# Patient Record
Sex: Male | Born: 1975 | Race: White | Hispanic: No | Marital: Married | Smoking: Never smoker
Health system: Southern US, Community
[De-identification: ages and names within clinical notes are randomized; demographics above are authoritative.]

## PROBLEM LIST (undated history)

## (undated) DIAGNOSIS — E785 Hyperlipidemia, unspecified: Secondary | ICD-10-CM

## (undated) DIAGNOSIS — L259 Unspecified contact dermatitis, unspecified cause: Secondary | ICD-10-CM

## (undated) DIAGNOSIS — K645 Perianal venous thrombosis: Secondary | ICD-10-CM

## (undated) DIAGNOSIS — Z87891 Personal history of nicotine dependence: Secondary | ICD-10-CM

## (undated) DIAGNOSIS — F329 Major depressive disorder, single episode, unspecified: Secondary | ICD-10-CM

## (undated) DIAGNOSIS — F909 Attention-deficit hyperactivity disorder, unspecified type: Secondary | ICD-10-CM

## (undated) DIAGNOSIS — M542 Cervicalgia: Secondary | ICD-10-CM

## (undated) DIAGNOSIS — I10 Essential (primary) hypertension: Secondary | ICD-10-CM

## (undated) DIAGNOSIS — F32A Depression, unspecified: Secondary | ICD-10-CM

## (undated) DIAGNOSIS — M1990 Unspecified osteoarthritis, unspecified site: Secondary | ICD-10-CM

## (undated) DIAGNOSIS — S62366A Nondisplaced fracture of neck of fifth metacarpal bone, right hand, initial encounter for closed fracture: Secondary | ICD-10-CM

## (undated) HISTORY — PX: HERNIA REPAIR: SHX51

## (undated) HISTORY — PX: SHOULDER SURGERY: SHX246

## (undated) HISTORY — DX: Attention-deficit hyperactivity disorder, unspecified type: F90.9

## (undated) HISTORY — DX: Major depressive disorder, single episode, unspecified: F32.9

## (undated) HISTORY — DX: Cervicalgia: M54.2

## (undated) HISTORY — DX: Hyperlipidemia, unspecified: E78.5

## (undated) HISTORY — DX: Essential (primary) hypertension: I10

## (undated) HISTORY — DX: Unspecified osteoarthritis, unspecified site: M19.90

## (undated) HISTORY — DX: Depression, unspecified: F32.A

## (undated) HISTORY — DX: Personal history of nicotine dependence: Z87.891

## (undated) HISTORY — DX: Nondisplaced fracture of neck of fifth metacarpal bone, right hand, initial encounter for closed fracture: S62.366A

## (undated) HISTORY — DX: Unspecified contact dermatitis, unspecified cause: L25.9

## (undated) HISTORY — DX: Perianal venous thrombosis: K64.5

---

## 2004-10-21 ENCOUNTER — Emergency Department (HOSPITAL_COMMUNITY): Admission: EM | Admit: 2004-10-21 | Discharge: 2004-10-21 | Payer: Self-pay | Admitting: Emergency Medicine

## 2009-02-20 ENCOUNTER — Telehealth: Payer: Self-pay | Admitting: Internal Medicine

## 2009-02-20 ENCOUNTER — Ambulatory Visit: Payer: Self-pay | Admitting: Internal Medicine

## 2009-02-20 DIAGNOSIS — F3289 Other specified depressive episodes: Secondary | ICD-10-CM

## 2009-02-20 DIAGNOSIS — F329 Major depressive disorder, single episode, unspecified: Secondary | ICD-10-CM

## 2009-02-20 HISTORY — DX: Other specified depressive episodes: F32.89

## 2009-02-20 HISTORY — DX: Major depressive disorder, single episode, unspecified: F32.9

## 2009-03-16 ENCOUNTER — Ambulatory Visit: Payer: Self-pay | Admitting: Internal Medicine

## 2009-03-16 DIAGNOSIS — Z87891 Personal history of nicotine dependence: Secondary | ICD-10-CM

## 2009-03-16 HISTORY — DX: Personal history of nicotine dependence: Z87.891

## 2009-09-03 ENCOUNTER — Emergency Department (HOSPITAL_COMMUNITY): Admission: EM | Admit: 2009-09-03 | Discharge: 2009-09-03 | Payer: Self-pay | Admitting: Family Medicine

## 2009-09-13 ENCOUNTER — Ambulatory Visit: Payer: Self-pay | Admitting: Internal Medicine

## 2009-09-13 ENCOUNTER — Encounter: Payer: Self-pay | Admitting: Internal Medicine

## 2009-09-13 DIAGNOSIS — M542 Cervicalgia: Secondary | ICD-10-CM

## 2009-09-13 HISTORY — DX: Cervicalgia: M54.2

## 2009-10-06 ENCOUNTER — Ambulatory Visit: Payer: Self-pay | Admitting: Internal Medicine

## 2009-10-06 DIAGNOSIS — L259 Unspecified contact dermatitis, unspecified cause: Secondary | ICD-10-CM

## 2009-10-06 HISTORY — DX: Unspecified contact dermatitis, unspecified cause: L25.9

## 2010-02-12 IMAGING — CT CT HEAD W/O CM
1 series · 16 of 30 positions shown, 20 images · non-contrast
Comparison: None

CLINICAL DATA: Headaches with nausea and dizziness

CT HEAD WITHOUT CONTRAST
TECHNIQUE: Contiguous axial images were obtained from the base of
the skull through the vertex without contrast.

[Series 2: head_seq 4.5 h37s st · axial · 0.43mm/px · z∈[+1275,+1423]mm · 16 of 36 slices shown, 20 images]
[im 2/36  brain]
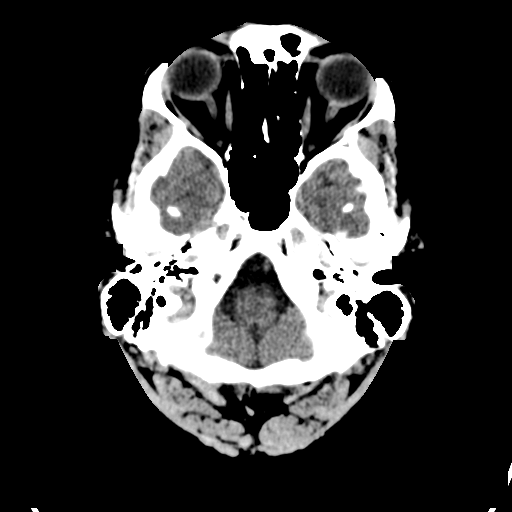
[im 2/36  bone]
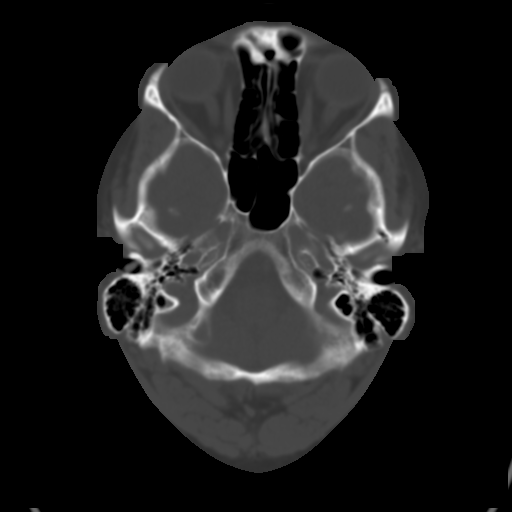
[im 4/36  brain]
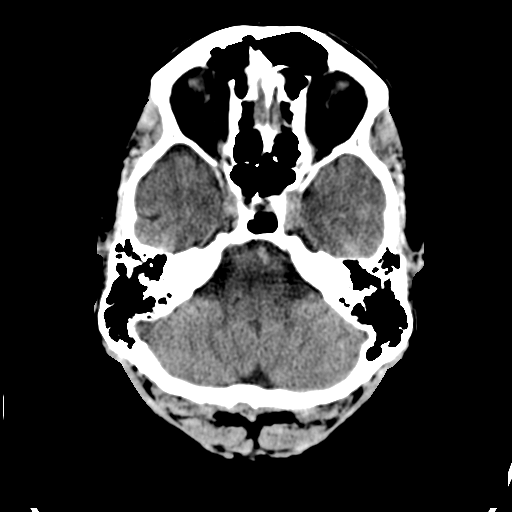
[im 7/36  brain]
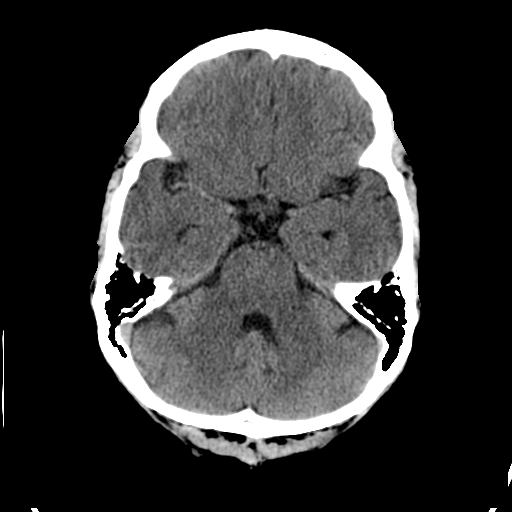
[im 9/36  brain]
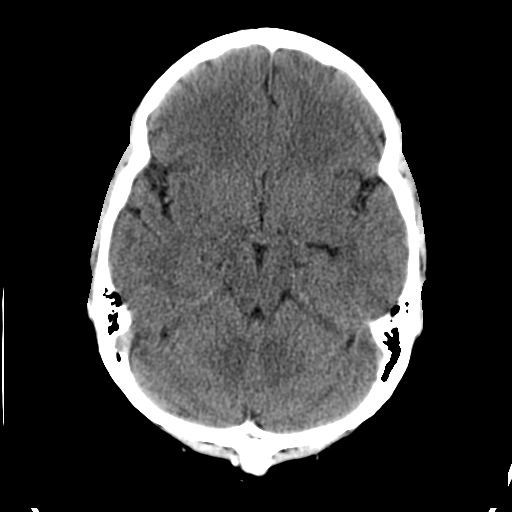
[im 10/36  brain]
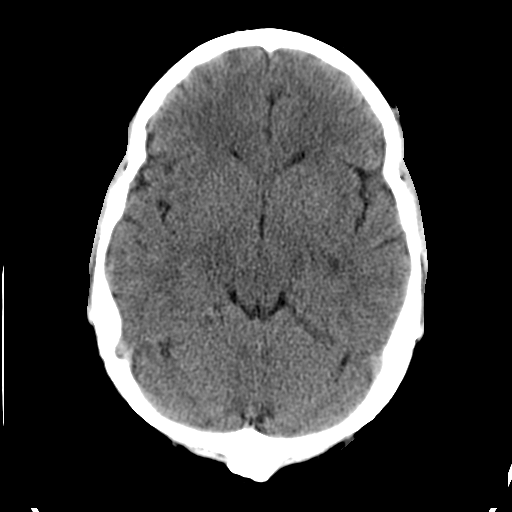
[im 10/36  bone]
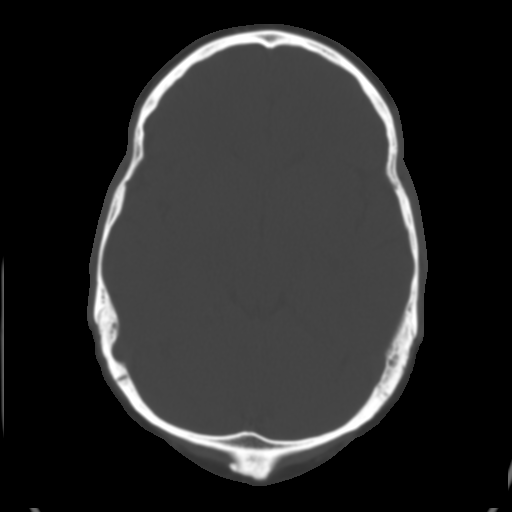
[im 13/36  brain]
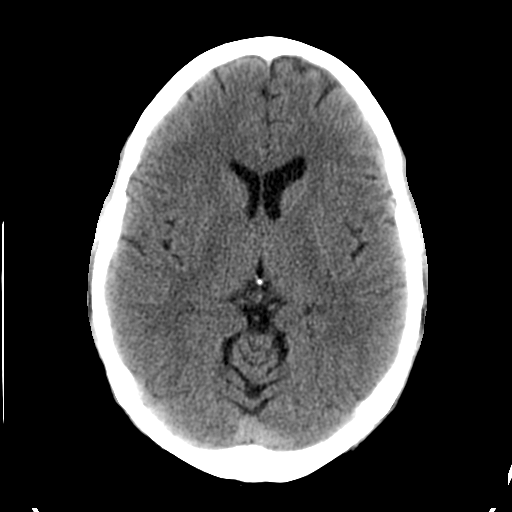
[im 15/36  brain]
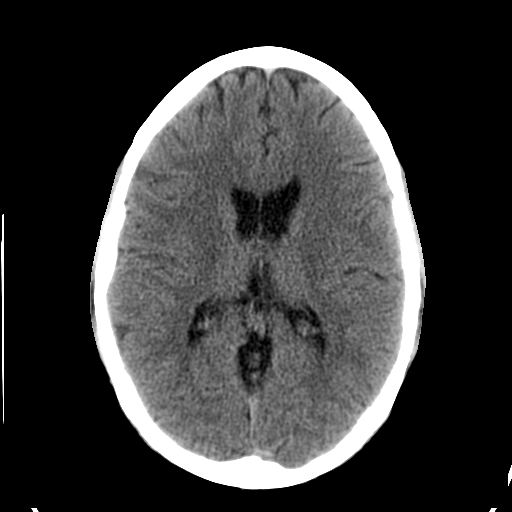
[im 17/36  brain]
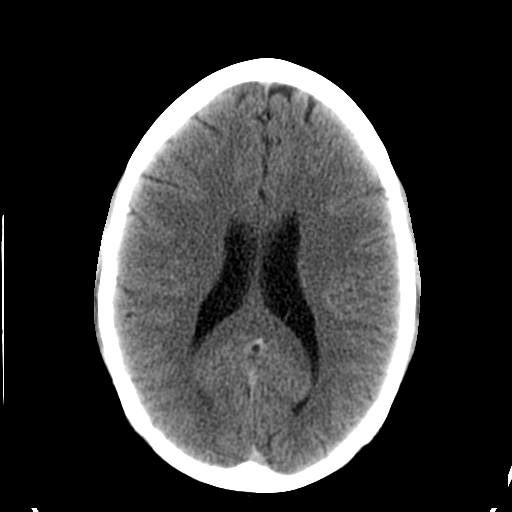
[im 19/36  brain]
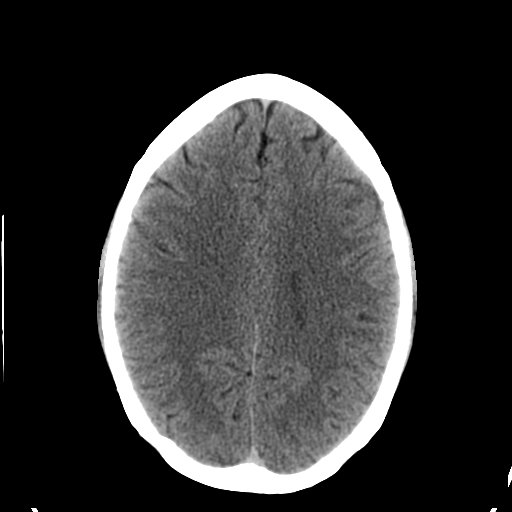
[im 19/36  bone]
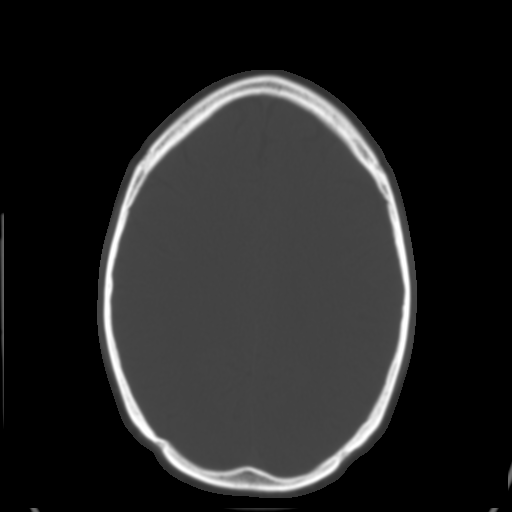
[im 21/36  brain]
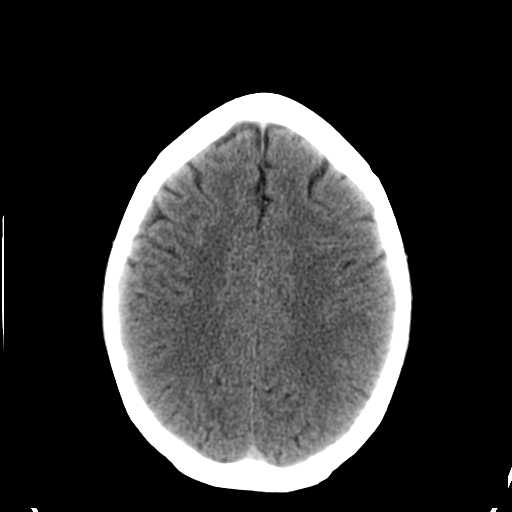
[im 23/36  brain]
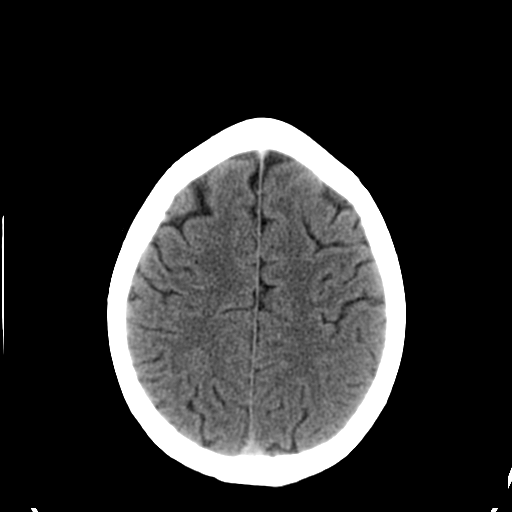
[im 26/36  brain]
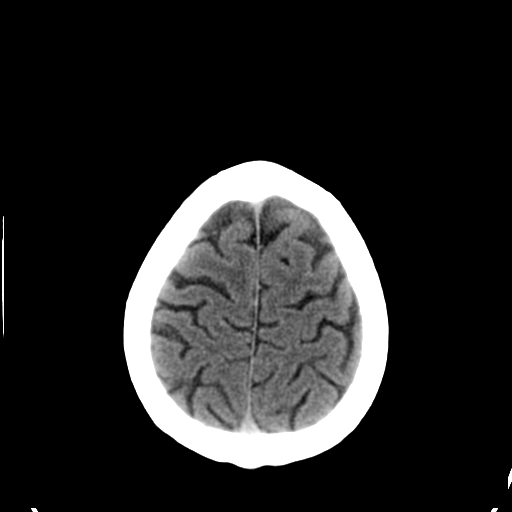
[im 27/36  brain]
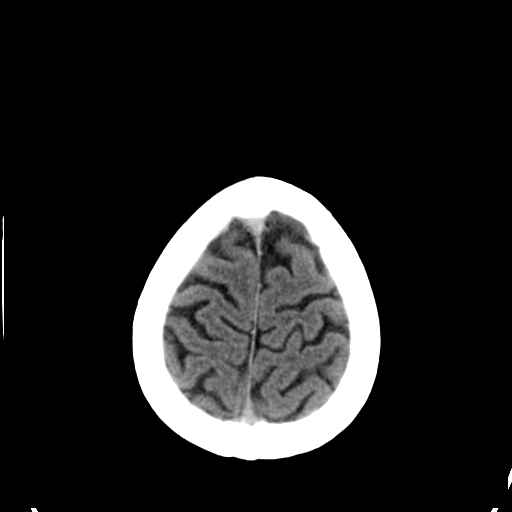
[im 27/36  bone]
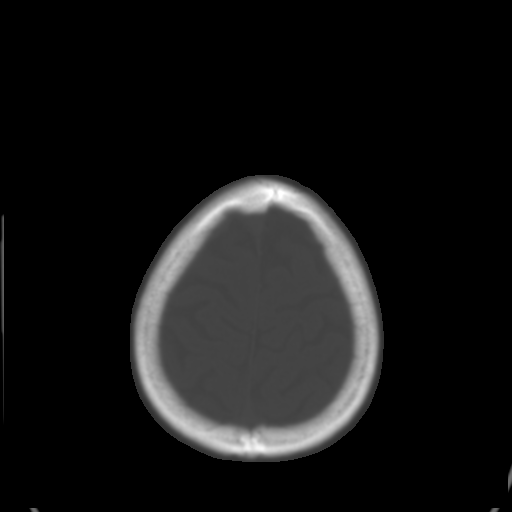
[im 29/36  brain]
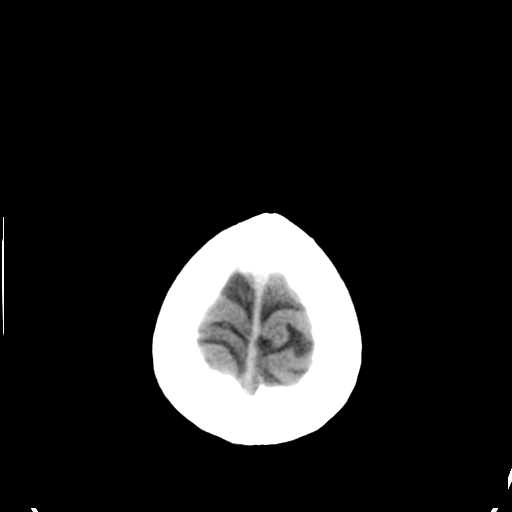
[im 32/36  brain]
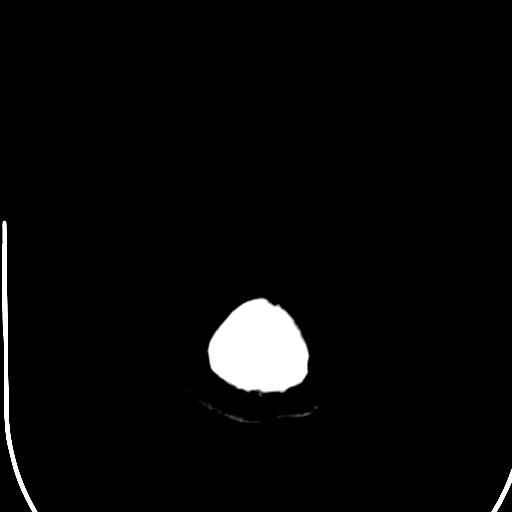
[im 34/36  brain]
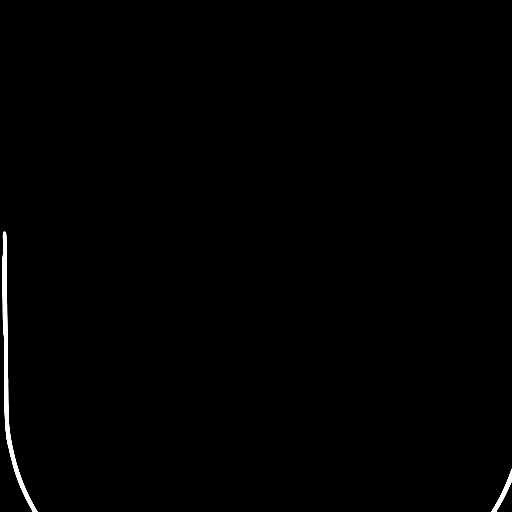

[16 of 30 positions shown; findings below may reference images not displayed]

FINDINGS: Ventricular size and CSF spaces within normal limits. No
evidence for acute infarct, hemorrhage, or mass lesion. No extra-
axial fluid collections or midline shift.  Calvarium intact.  No
fluid in the sinuses visualized.
IMPRESSION: No acute or significant findings.

## 2010-07-08 LAB — CONVERTED CEMR LAB
Direct LDL: 154.7 mg/dL
Total CHOL/HDL Ratio: 4
VLDL: 25 mg/dL (ref 0.0–40.0)

## 2010-07-10 NOTE — Assessment & Plan Note (Signed)
Summary: RASH FOREARMS/CD   Vital Signs:  Patient profile:   35 year old male Height:      72 inches Weight:      188 pounds BMI:     25.59 O2 Sat:      96 % on Room air Temp:     98.0 degrees F oral Pulse rate:   72 / minute Pulse rhythm:   regular Resp:     16 per minute BP sitting:   122 / 84  (left arm) Cuff size:   large  Vitals Entered By: Rock Nephew CMA (October 06, 2009 3:00 PM)  O2 Flow:  Room air CC: Bilateral recurrent arm rash Is Patient Diabetic? No Pain Assessment Patient in pain? no        Primary Care Provider:  Etta Grandchild MD  CC:  Bilateral recurrent arm rash.  History of Present Illness: He returns c/o an intermittent rash on both forearms off and on for one year. It seems to worsen with sun exposure which causes it to whelp up.  Allergies: 1)  ! Sulfa  Past History:  Past Medical History: Reviewed history from 02/20/2009 and no changes required. Depression  Past Surgical History: Reviewed history from 02/20/2009 and no changes required. Inguinal herniorrhaphy  Family History: Reviewed history from 02/20/2009 and no changes required. N/A  Social History: Reviewed history from 02/20/2009 and no changes required. Occupation: Teaching laboratory technician Never Smoked Alcohol use-yes Drug use-no Regular exercise-yes  Review of Systems Derm:  Complains of itching and rash; denies changes in color of skin, changes in nail beds, dryness, excessive perspiration, flushing, hair loss, lesion(s), and poor wound healing.  Physical Exam  General:  Well-developed, well-nourished, in no acute distress; alert and oriented x 3.   Mouth:  Oral mucosa and oropharynx without lesions or exudates.  Teeth in good repair. Neck:  supple, full ROM, no masses, no thyromegaly, normal carotid upstroke, no carotid bruits, and no cervical lymphadenopathy.   Lungs:  Normal respiratory effort, chest expands symmetrically. Lungs are clear to auscultation, no  crackles or wheezes. Heart:  Normal rate and regular rhythm. S1 and S2 normal without gallop, murmur, click, rub or other extra sounds. Abdomen:  Bowel sounds positive,abdomen soft and non-tender without masses, organomegaly or hernias noted. Skin:  over the sun-exposed portions of his forearms he has some xerosis and faint areas of ezcema but there are no excoriations, wheals, lichenifications, abnormal pigment, pustules, or PIPA. Cervical Nodes:  no anterior cervical adenopathy and no posterior cervical adenopathy.   Axillary Nodes:  no R axillary adenopathy and no L axillary adenopathy.   Inguinal Nodes:  no R inguinal adenopathy and no L inguinal adenopathy.   Psych:  Cognition and judgment appear intact. Alert and cooperative with normal attention span and concentration. No apparent delusions, illusions, hallucinations   Impression & Recommendations:  Problem # 1:  CONTACT DERMATITIS&OTHER ECZEMA DUE UNSPEC CAUSE (ICD-692.9) Assessment New  try some otc anti-histamines like zyrtec His updated medication list for this problem includes:    Triamcinolone Acetonide 0.5 % Crea (Triamcinolone acetonide) .Marland Kitchen... Apply to aa two times a day  Orders: Admin of Therapeutic Inj  intramuscular or subcutaneous (61607) Depo- Medrol 80mg  (J1040) Depo- Medrol 40mg  (J1030)  Discussed avoidance of triggers and symptomatic treatment.   Complete Medication List: 1)  Fluvoxamine Maleate 100 Mg Tabs (Fluvoxamine maleate) .... Once daily 2)  Triamcinolone Acetonide 0.5 % Crea (Triamcinolone acetonide) .... Apply to aa two times a day  Patient Instructions:  1)  Please schedule a follow-up appointment in 1 month. Prescriptions: TRIAMCINOLONE ACETONIDE 0.5 % CREA (TRIAMCINOLONE ACETONIDE) Apply to AA two times a day  #30 gms x 2   Entered and Authorized by:   Etta Grandchild MD   Signed by:   Etta Grandchild MD on 10/06/2009   Method used:   Electronically to        [id number] (retail)        [address]       [place], [place]  [address]       Ph: [phone] or [phone]       Fax: [phone]   RxID:   [id number]    Medication Administration  Injection # 1:    Medication: Depo- Medrol 40mg     Diagnosis: CONTACT DERMATITIS&OTHER ECZEMA DUE UNSPEC CAUSE (ICD-692.9)    Route: IM    Site: R deltoid    Exp Date: 04/2012    Lot #: [id number]    Mfr: pfizer    Patient tolerated injection without complications    Given by: Rock Nephew CMA (October 06, 2009 1:33 PM)  Injection # 2:    Medication: Depo- Medrol 80mg     Diagnosis: CONTACT DERMATITIS&OTHER ECZEMA DUE UNSPEC CAUSE (ICD-692.9)    Route: IM    Site: R deltoid    Exp Date: 04/2012    Lot #: [id number]    Mfr: pfizer    Patient tolerated injection without complications    Given by: Rock Nephew CMA (October 06, 2009 1:33 PM)  Orders Added: 1)  Admin of Therapeutic Inj  intramuscular or subcutaneous [96372] 2)  Depo- Medrol 80mg  [J1040] 3)  Depo- Medrol 40mg  [J1030] 4)  Est. Patient Level IV [42595]
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
## 2010-07-10 NOTE — Letter (Signed)
Summary: Results Follow-up Letter  [facility]  [address] [place], [place] [address]   Phone: [phone]  Fax: [phone]    09/13/2009  [address] [place], [place]  [address]  Dear Albert [name],   The following are the results of your recent test(s):  Test     Result     Neck Xray     normal  _________________________________________________________  Please call for an appointment as directed _________________________________________________________ _________________________________________________________ _________________________________________________________  Sincerely,  Sanda Linger MD [facility]

## 2015-10-06 ENCOUNTER — Encounter: Payer: Self-pay | Admitting: Family

## 2015-10-06 ENCOUNTER — Ambulatory Visit (INDEPENDENT_AMBULATORY_CARE_PROVIDER_SITE_OTHER): Payer: 59 | Admitting: Family

## 2015-10-06 VITALS — BP 134/90 | HR 66 | Temp 98.0°F | Resp 16 | Ht 72.0 in | Wt 194.0 lb

## 2015-10-06 DIAGNOSIS — K645 Perianal venous thrombosis: Secondary | ICD-10-CM | POA: Diagnosis not present

## 2015-10-06 HISTORY — DX: Perianal venous thrombosis: K64.5

## 2015-10-06 MED ORDER — HYDROCORTISONE 2.5 % RE CREA: 1.0000 "application " | TOPICAL_CREAM | Freq: Two times a day (BID) | RECTAL | Status: DC

## 2015-10-06 MED ORDER — ACETAMINOPHEN-CODEINE #3 300-30 MG PO TABS: 1.0000 | ORAL_TABLET | ORAL | Status: DC | PRN

## 2015-10-06 NOTE — Patient Instructions (Signed)
Thank you for choosing [facility].  Summary/Instructions:  Your prescription(s) have been submitted to your pharmacy or been printed and provided for you. Please take as directed and contact our office if you believe you are having problem(s) with the medication(s) or have any questions.  If your symptoms worsen or fail to improve, please contact our office for further instruction, or in case of emergency go directly to the emergency room at the closest medical facility.   Hemorrhoids Hemorrhoids are swollen veins around the rectum or anus. There are two types of hemorrhoids:   Internal hemorrhoids. These occur in the veins just inside the rectum. They may poke through to the outside and become irritated and painful.  External hemorrhoids. These occur in the veins outside the anus and can be felt as a painful swelling or hard lump near the anus. CAUSES 1. Pregnancy.  2. Obesity.  3. Constipation or diarrhea.  4. Straining to have a bowel movement.  5. Sitting for long periods on the toilet. 6. Heavy lifting or other activity that caused you to strain. 7. Anal intercourse. SYMPTOMS   Pain.   Anal itching or irritation.   Rectal bleeding.   Fecal leakage.   Anal swelling.   One or more lumps around the anus.  DIAGNOSIS  Your caregiver may be able to diagnose hemorrhoids by visual examination. Other examinations or tests that may be performed include:   Examination of the rectal area with a gloved hand (digital rectal exam).   Examination of anal canal using a small tube (scope).   A blood test if you have lost a significant amount of blood.  A test to look inside the colon (sigmoidoscopy or colonoscopy). TREATMENT Most hemorrhoids can be treated at home. However, if symptoms do not seem to be getting better or if you have a lot of rectal bleeding, your caregiver may perform a procedure to help make the hemorrhoids get smaller or remove them completely.  Possible treatments include:   Placing a rubber band at the base of the hemorrhoid to cut off the circulation (rubber band ligation).   Injecting a chemical to shrink the hemorrhoid (sclerotherapy).   Using a tool to burn the hemorrhoid (infrared light therapy).   Surgically removing the hemorrhoid (hemorrhoidectomy).   Stapling the hemorrhoid to block blood flow to the tissue (hemorrhoid stapling).  HOME CARE INSTRUCTIONS   Eat foods with fiber, such as whole grains, beans, nuts, fruits, and vegetables. Ask your doctor about taking products with added fiber in them (fibersupplements).  Increase fluid intake. Drink enough water and fluids to keep your urine clear or pale yellow.   Exercise regularly.   Go to the bathroom when you have the urge to have a bowel movement. Do not wait.   Avoid straining to have bowel movements.   Keep the anal area dry and clean. Use wet toilet paper or moist towelettes after a bowel movement.   Medicated creams and suppositories may be used or applied as directed.   Only take over-the-counter or prescription medicines as directed by your caregiver.   Take warm sitz baths for 15-20 minutes, 3-4 times a day to ease pain and discomfort.   Place ice packs on the hemorrhoids if they are tender and swollen. Using ice packs between sitz baths may be helpful.   Put ice in a plastic bag.   Place a towel between your skin and the bag.   Leave the ice on for 15-20 minutes, 3-4 times a  day.   Do not use a donut-shaped pillow or sit on the toilet for long periods. This increases blood pooling and pain.  SEEK MEDICAL CARE IF:  You have increasing pain and swelling that is not controlled by treatment or medicine.  You have uncontrolled bleeding.  You have difficulty or you are unable to have a bowel movement.  You have pain or inflammation outside the area of the hemorrhoids. MAKE SURE YOU:  Understand these instructions.  Will  watch your condition.  Will get help right away if you are not doing well or get worse.   This information is not intended to replace advice given to you by your health care provider. Make sure you discuss any questions you have with your health care provider.   Document Released: 05/24/2000 Document Revised: 05/13/2012 Document Reviewed: 03/31/2012 Elsevier Interactive Patient Education 2016 [employer]  How to Take a I[employer] IndustriesSitz Bath A sitz bath is a warm water bath that is taken while you are sitting down. The water should only come up to your hips and should cover your buttocks. Your health care provider may recommend a sitz bath to help you:   Clean the lower part of your body, including your genital area.  With itching.  With pain.  With sore muscles or muscles that tighten or spasm. HOW TO TAKE A SITZ BATH Take 3-4 sitz baths per day or as told by your health care provider. 8. Partially fill a bathtub with warm water. You will only need the water to be deep enough to cover your hips and buttocks when you are sitting in it. 9. If your health care provider told you to put medicine in the water, follow the directions exactly. 10. Sit in the water and open the tub drain a little. 11. Turn on the warm water again to keep the tub at the correct level. Keep the water running constantly. 12. Soak in the water for 15-20 minutes or as told by your health care provider. 13. After the sitz bath, pat the affected area dry first. Do not rub it. 14. Be careful when you stand up after the sitz bath because you may feel dizzy. SEEK MEDICAL CARE IF:  Your symptoms get worse. Do not continue with sitz baths if your symptoms get worse.  You have new symptoms. Do not continue with sitz baths until you talk with your health care provider.   This information is not intended to replace advice given to you by your health care provider. Make sure you discuss any questions you have with your health care  provider.   Document Released: 02/17/2004 Document Revised: 10/11/2014 Document Reviewed: 05/25/2014 Elsevier Interactive Patient Education [employer] Elsevier Inc.

## 2015-10-06 NOTE — Progress Notes (Signed)
Pre visit review using our clinic review tool, if applicable. No additional management support is needed unless otherwise documented below in the visit note. 

## 2015-10-06 NOTE — Progress Notes (Signed)
   Subjective:    Patient ID: Albert [name], male    DOB: [birth year], 40 y.o.   MRN: [id number]  Chief Complaint  Patient presents with  . Establish Care    has been going to the gym and thinks he has a hemorrhoid    HPI:  Albert FirstJohn Vane is a 40 y.o. male who  has a past medical history of Depression and Hyperlipidemia. and presents today for an office visit to establish care.   Previously diagnosed with hemorrhoids. Recently he was performing a squat with exercise with extreme straining resulting in pain located in his rectum that has been going on for a couple of days. Modifying factors include Aleve which has helped a little with the pain. Endorses some burning, itching and pressure. There has been a little improvement since initial onset.  Reports normal bowel movements with no blood in his stool.   Allergies  Allergen Reactions  . Sulfonamide Derivatives     REACTION: Rash Stiff joints     No current outpatient prescriptions on file prior to visit.   No current facility-administered medications on file prior to visit.    Review of Systems  Constitutional: Negative for fever and chills.  Gastrointestinal: Positive for rectal pain. Negative for diarrhea, constipation and blood in stool.      Objective:    BP 134/90 mmHg  Pulse 66  Temp(Src) 98 F (36.7 C) (Oral)  Resp 16  Ht 6' (1.829 m)  Wt 194 lb (87.998 kg)  BMI 26.31 kg/m2  SpO2 96% Nursing note and vital signs reviewed.  Physical Exam  Constitutional: He is oriented to person, place, and time. He appears well-developed and well-nourished. No distress.  Cardiovascular: Normal rate, regular rhythm, normal heart sounds and intact distal pulses.   Pulmonary/Chest: Effort normal and breath sounds normal.  Genitourinary: Rectal exam shows external hemorrhoid (Appears thrombosed).     Neurological: He is alert and oriented to person, place, and time.  Skin: Skin is warm and dry.  Psychiatric: He has a normal mood  and affect. His behavior is normal. Judgment and thought content normal.       Assessment & Plan:   Problem List Items Addressed This Visit      Cardiovascular and Mediastinum   Thrombosed external hemorrhoid - Primary   Relevant Medications   hydrocortisone (ANUSOL-HC) 2.5 % rectal cream       I am having Mr. Lawerance BachBurns start on hydrocortisone and acetaminophen-codeine. I am also having him maintain his fluvoxaMINE.   Meds ordered this encounter  Medications  . fluvoxaMINE (LUVOX) 100 MG tablet    Sig: Take 200 mg by mouth at bedtime.  . hydrocortisone (ANUSOL-HC) 2.5 % rectal cream    Sig: Place 1 application rectally 2 (two) times daily.    Dispense:  30 g    Refill:  0    Order Specific Question:  Supervising Provider    Answer:  Hillard DankerRAWFORD, ELIZABETH A [[id number]]  . acetaminophen-codeine (TYLENOL #3) 300-30 MG tablet    Sig: Take 1 tablet by mouth every 4 (four) hours as needed for moderate pain.    Dispense:  30 tablet    Refill:  0    Order Specific Question:  Supervising Provider    Answer:  Hillard DankerRAWFORD, ELIZABETH A [[id number]]     Follow-up: Return if symptoms worsen or fail to improve.  Jeanine Luzalone, Gregory, FNP

## 2016-06-19 DIAGNOSIS — F909 Attention-deficit hyperactivity disorder, unspecified type: Secondary | ICD-10-CM

## 2016-06-19 HISTORY — DX: Attention-deficit hyperactivity disorder, unspecified type: F90.9

## 2016-07-10 DIAGNOSIS — S62366A Nondisplaced fracture of neck of fifth metacarpal bone, right hand, initial encounter for closed fracture: Secondary | ICD-10-CM

## 2016-07-10 HISTORY — DX: Nondisplaced fracture of neck of fifth metacarpal bone, right hand, initial encounter for closed fracture: S62.366A

## 2017-03-24 ENCOUNTER — Ambulatory Visit: Payer: 59 | Admitting: Family Medicine

## 2017-07-14 DIAGNOSIS — R079 Chest pain, unspecified: Secondary | ICD-10-CM | POA: Insufficient documentation

## 2017-07-18 ENCOUNTER — Ambulatory Visit: Payer: 59 | Admitting: Cardiology

## 2017-07-18 ENCOUNTER — Other Ambulatory Visit: Payer: Self-pay

## 2017-07-18 ENCOUNTER — Encounter: Payer: Self-pay | Admitting: Cardiology

## 2017-07-18 VITALS — BP 152/94 | HR 65 | Ht 73.0 in | Wt 197.0 lb

## 2017-07-18 DIAGNOSIS — R079 Chest pain, unspecified: Secondary | ICD-10-CM | POA: Diagnosis not present

## 2017-07-18 DIAGNOSIS — I10 Essential (primary) hypertension: Secondary | ICD-10-CM

## 2017-07-18 DIAGNOSIS — E785 Hyperlipidemia, unspecified: Secondary | ICD-10-CM

## 2017-07-18 DIAGNOSIS — Z7189 Other specified counseling: Secondary | ICD-10-CM | POA: Diagnosis not present

## 2017-07-18 MED ORDER — TELMISARTAN-HCTZ 40-12.5 MG PO TABS: 1.0000 | ORAL_TABLET | Freq: Every day | ORAL | 3 refills | Status: DC

## 2017-07-18 NOTE — Progress Notes (Signed)
Cardiology Office Note:    Date:  07/20/2017   ID:  Albert [name], DOB July 10, 1975, MRN [id number]  PCP:  Veryl Speakalone, Gregory D, FNP  Cardiologist:  Norman HerrlichBrian Aztlan Coll, MD   Referring MD: Jorene GuestFerro, Richard T, MD  ASSESSMENT:    1. Essential hypertension 2. Hyperlipidemia 3. Cardiac risk assessment PLAN:    In order of problems listed above:  1. Stage 2 , start ARB/ diuretic with daily home BP and PCP FU. He was advised DASH diet and activity > 150 minutes a week. Avoid NSAID that can elevated BP> 2. Life style and diet changes, reassess after 3 months and I would not start a statin at this time. 3. His risk is not severe and I did not advise a stress test at this time.  Next appointment as needed, I referred to PCP for ongoing treatment of hypertension and follow up for hyperlipidemia.   Medication Adjustments/Labs and Tests Ordered: Current medicines are reviewed at length with the patient today.  Concerns regarding medicines are outlined above.  Orders Placed This Encounter  Procedures  . Aldosterone + renin activity w/ ratio  . Basic metabolic panel  . EKG 12-Lead   Meds ordered this encounter  Medications  . telmisartan-hydrochlorothiazide (MICARDIS HCT) 40-12.5 MG tablet    Sig: Take 1 tablet by mouth daily.    Dispense:  90 tablet    Refill:  3     Chief Complaint  Patient presents with  . Referral    pt was referred from pcp because of high cholesterol and high blood pressure.  . Hypertension    Do I need a stress test    History of Present Illness:    Albert PolingJohn R [name] is a 42 y.o. male who is being seen today for the evaluation of elevated BP 150-160/100, " I might need a stress test" at his request .He has been seen at urgent care in [place] after fall and rib fractures, found to have consistently elevated BP and advised to see cardiology. Home BP averages 140-150/90-100 and not started on medical therapy to date.Cholesterol was elevated at 240.He travels extensively,  is sedentary and has taken NSAID after rib fracture for pain control. He has had no angina, exercise intolerance, palpitation or syncope. He has no history of murmur, congenital or rheumatic heart disease.He ias committed to life style changes and ready to take antihypertensive therapy if needed.  Past Medical History:  Diagnosis Date  . Attention deficit hyperactivity disorder (ADHD) 06/19/2016  . Closed nondisplaced fracture of neck of fifth metacarpal bone of right hand 07/10/2016  . CONTACT DERMATITIS&OTHER ECZEMA DUE UNSPEC CAUSE 10/06/2009   Qualifier: Diagnosis of  By: Yetta BarreJones MD, Bernadene Bellhomas L.   . Depression   . DEPRESSION 02/20/2009   Qualifier: Diagnosis of  By: Yetta BarreJones MD, Bernadene Bellhomas L.   . Hyperlipidemia   . NECK PAIN, ACUTE 09/13/2009   Qualifier: Diagnosis of  By: Yetta BarreJones MD, Bernadene Bellhomas L.   . Thrombosed external hemorrhoid 10/06/2015   Symptoms and exam consistent with thrombosed external hemorrhoid. Patient declines incise and drain at this time. Treat conservatively with hydrocortisone rectal cream and Tylenol 3 as needed for discomfort. Recommend sitz bath. Follow-up if symptoms worsen or do not improve or would like to seek interventional procedure.   . TOBACCO USE, QUIT 03/16/2009   Qualifier: Diagnosis of  By: Tora PerchesGrace, Vanessa      Past Surgical History:  Procedure Laterality Date  . HERNIA REPAIR      Current Medications:  Current Meds  Medication Sig  . amphetamine-dextroamphetamine (ADDERALL XR) 5 MG 24 hr capsule Take by mouth.  Marland Kitchen amphetamine-dextroamphetamine (ADDERALL) 10 MG tablet   . buPROPion (WELLBUTRIN SR) 100 MG 12 hr tablet Take 100 mg by mouth 2 (two) times daily.  Marland Kitchen buPROPion (WELLBUTRIN XL) 150 MG 24 hr tablet TAEK 1 TABLET BY MOUTH EVERY MORNING  . clomiPHENE (CLOMID) 50 MG tablet   . DUEXIS 800-26.6 MG TABS Take 1 tablet by mouth 3 (three) times daily.  . fluvoxaMINE (LUVOX) 100 MG tablet Take 200 mg by mouth at bedtime.     Allergies:   Sulfamethoxazole and Sulfonamide  derivatives   Social History   Socioeconomic History  . Marital status: Widowed    Spouse name: None  . Number of children: 1  . Years of education: 62  . Highest education level: None  Social Needs  . Financial resource strain: None  . Food insecurity - worry: None  . Food insecurity - inability: None  . Transportation needs - medical: None  . Transportation needs - non-medical: None  Occupational History  . Occupation: Sales  Tobacco Use  . Smoking status: Never Smoker  . Smokeless tobacco: Never Used  Substance and Sexual Activity  . Alcohol use: Yes    Alcohol/week: 4.2 oz    Types: 7 Cans of beer per week  . Drug use: No  . Sexual activity: None  Other Topics Concern  . None  Social History Narrative   Fun: Exercise, play music, sports     Family History: The patient's family history includes Healthy in his mother; Heart disease in his paternal grandfather; Heart failure in his paternal grandfather; Hyperlipidemia in his paternal grandfather.  ROS:   Review of Systems  Constitution: Negative.  HENT: Negative.   Eyes: Negative.   Cardiovascular: Positive for chest pain (severeal rib fractures, improved, taking NSAID PRN) and leg swelling (on NSAID). Negative for claudication, cyanosis, dyspnea on exertion, irregular heartbeat, near-syncope, orthopnea, palpitations and paroxysmal nocturnal dyspnea.  Respiratory: Negative.   Endocrine: Negative.   Skin: Negative.   Musculoskeletal: Negative.   Gastrointestinal: Negative.   Genitourinary: Negative.   Neurological: Negative.   Psychiatric/Behavioral: Negative.   Allergic/Immunologic: Negative.    Please see the history of present illness.     All other systems reviewed and are negative.  EKGs/Labs/Other Studies Reviewed:    The following studies were reviewed today:  EKG:  EKG is  ordered today.  The ekg ordered today demonstrates [facility] and is normal, no findings of LVH  Recent Labs: My recent cholesterol is  240 but not advised medication 07/18/2017: BUN 17; Creatinine, Ser 1.03; Potassium 4.5; Sodium 141  Recent Lipid Panel    Component Value Date/Time   CHOL 228 (H) 02/20/2009 0000   TRIG 125.0 02/20/2009 0000   HDL 55.40 02/20/2009 0000   CHOLHDL 4 02/20/2009 0000   VLDL 25.0 02/20/2009 0000   LDLDIRECT 154.7 02/20/2009 0000    Physical Exam:    VS:  BP (!) 152/94 (BP Location: Right Arm, Patient Position: Sitting, Cuff Size: Normal)   Pulse 65   Ht 6\' 1"  (1.854 m)   Wt 197 lb (89.4 kg)   SpO2 98%   BMI 25.99 kg/m     Wt Readings from Last 3 Encounters:  07/18/17 197 lb (89.4 kg)  10/06/15 194 lb (88 kg)     GEN:  Well nourished, well developed in no acute distress HEENT: Normal NECK: No JVD; No carotid bruits  LYMPHATICS: No lymphadenopathy CARDIAC: RRR, no murmurs, rubs, gallops RESPIRATORY:  Clear to auscultation without rales, wheezing or rhonchi  ABDOMEN: Soft, non-tender, non-distended no renal bruit noted MUSCULOSKELETAL:  No edema; No deformity  SKIN: Warm and dry NEUROLOGIC:  Alert and oriented x 3 PSYCHIATRIC:  Normal affect     Signed, Norman Herrlich, MD  07/20/2017 8:01 PM    [facility]

## 2017-07-18 NOTE — Patient Instructions (Addendum)
Medication Instructions:  Your physician has recommended you make the following change in your medication:  START telmisartan-hydrochlorothiazide 40-12.5 mg daily  Labwork: Your physician recommends that you return for lab work in: today. BMP, aldosterone/renin level  Testing/Procedures: None  Follow-Up: Your physician recommends that you schedule a follow-up appointment as needed if symptoms worsen or fail to improve.  Follow up with your primary doctor.  Any Other Special Instructions Will Be Listed Below (If Applicable).     If you need a refill on your cardiac medications before your next appointment, please call your pharmacy.    Goal of 150 minutes of activity a week  DASH diet: Healthy eating to lower your blood pressure The DASH diet emphasizes portion size, eating a variety of foods and getting the right amount of nutrients. Discover how DASH can improve your health and lower your blood pressure. By [facility] Staff  DASH stands for Dietary Approaches to Stop Hypertension. The DASH diet is a lifelong approach to healthy eating that's designed to help treat or prevent high blood pressure (hypertension). The DASH diet encourages you to reduce the sodium in your diet and eat a variety of foods rich in nutrients that help lower blood pressure, such as potassium, calcium and magnesium. By following the DASH diet, you may be able to reduce your blood pressure by a few points in just two weeks. Over time, your systolic blood pressure could drop by eight to 14 points, which can make a significant difference in your health risks. Because the DASH diet is a healthy way of eating, it offers health benefits besides just lowering blood pressure. The DASH diet is also in line with dietary recommendations to prevent osteoporosis, cancer, heart disease, stroke and diabetes. DASH diet: Sodium levels The DASH diet emphasizes vegetables, fruits and low-fat dairy foods - and moderate amounts  of whole grains, fish, poultry and nuts. In addition to the standard DASH diet, there is also a lower sodium version of the diet. You can choose the version of the diet that meets your health needs: Standard DASH diet. You can consume up to 2,300 milligrams (mg) of sodium a day.  Lower sodium DASH diet. You can consume up to 1,500 mg of sodium a day. Both versions of the DASH diet aim to reduce the amount of sodium in your diet compared with what you might get in a typical American diet, which can amount to a whopping 3,400 mg of sodium a day or more. The standard DASH diet meets the recommendation from the Dietary Guidelines for Americans to keep daily sodium intake to less than 2,300 mg a day. The American Heart Association recommends 1,500 mg a day of sodium as an upper limit for all adults. If you aren't sure what sodium level is right for you, talk to your doctor. DASH diet: What to eat Both versions of the DASH diet include lots of whole grains, fruits, vegetables and low-fat dairy products. The DASH diet also includes some fish, poultry and legumes, and encourages a small amount of nuts and seeds a few times a week.  You can eat red meat, sweets and fats in small amounts. The DASH diet is low in saturated fat, cholesterol and total fat. Here's a look at the recommended servings from each food group for the 2,000-calorie-a-day DASH diet. Grains: 6 to 8 servings a day Grains include bread, cereal, rice and pasta. Examples of one serving of grains include 1 slice whole-wheat bread, 1 ounce dry cereal,  or 1/2 cup cooked cereal, rice or pasta. Focus on whole grains because they have more fiber and nutrients than do refined grains. For instance, use brown rice instead of white rice, whole-wheat pasta instead of regular pasta and whole-grain bread instead of white bread. Look for products labeled "100 percent whole grain" or "100 percent whole wheat."  Grains are naturally low in fat. Keep them this  way by avoiding butter, cream and cheese sauces. Vegetables: 4 to 5 servings a day Tomatoes, carrots, broccoli, sweet potatoes, greens and other vegetables are full of fiber, vitamins, and such minerals as potassium and magnesium. Examples of one serving include 1 cup raw leafy green vegetables or 1/2 cup cut-up raw or cooked vegetables. Don't think of vegetables only as side dishes - a hearty blend of vegetables served over brown rice or whole-wheat noodles can serve as the main dish for a meal.  Fresh and frozen vegetables are both good choices. When buying frozen and canned vegetables, choose those labeled as low sodium or without added salt.  To increase the number of servings you fit in daily, be creative. In a stir-fry, for instance, cut the amount of meat in half and double up on the vegetables. Fruits: 4 to 5 servings a day Many fruits need little preparation to become a healthy part of a meal or snack. Like vegetables, they're packed with fiber, potassium and magnesium and are typically low in fat - coconuts are an exception. Examples of one serving include one medium fruit, 1/2 cup fresh, frozen or canned fruit, or 4 ounces of juice. Have a piece of fruit with meals and one as a snack, then round out your day with a dessert of fresh fruits topped with a dollop of low-fat yogurt.  Leave on edible peels whenever possible. The peels of apples, pears and most fruits with pits add interesting texture to recipes and contain healthy nutrients and fiber.  Remember that citrus fruits and juices, such as grapefruit, can interact with certain medications, so check with your doctor or pharmacist to see if they're OK for you.  If you choose canned fruit or juice, make sure no sugar is added. Dairy: 2 to 3 servings a day Milk, yogurt, cheese and other dairy products are major sources of calcium, vitamin D and protein. But the key is to make sure that you choose dairy products that are low fat or fat-free  because otherwise they can be a major source of fat - and most of it is saturated. Examples of one serving include 1 cup skim or 1 percent milk, 1 cup low fat yogurt, or 1 1/2 ounces part-skim cheese. Low-fat or fat-free frozen yogurt can help you boost the amount of dairy products you eat while offering a sweet treat. Add fruit for a healthy twist.  If you have trouble digesting dairy products, choose lactose-free products or consider taking an over-the-counter product that contains the enzyme lactase, which can reduce or prevent the symptoms of lactose intolerance.  Go easy on regular and even fat-free cheeses because they are typically high in sodium. Lean meat, poultry and fish: 6 servings or fewer a day Meat can be a rich source of protein, B vitamins, iron and zinc. Choose lean varieties and aim for no more than 6 ounces a day. Cutting back on your meat portion will allow room for more vegetables. Trim away skin and fat from poultry and meat and then bake, broil, grill or roast instead of frying in fat.  Eat heart-healthy fish, such as salmon, herring and tuna. These types of fish are high in omega-3 fatty acids, which can help lower your total cholesterol. Nuts, seeds and legumes: 4 to 5 servings a week Almonds, sunflower seeds, kidney beans, peas, lentils and other foods in this family are good sources of magnesium, potassium and protein. They're also full of fiber and phytochemicals, which are plant compounds that may protect against some cancers and cardiovascular disease. Serving sizes are small and are intended to be consumed only a few times a week because these foods are high in calories. Examples of one serving include 1/3 cup nuts, 2 tablespoons seeds, or 1/2 cup cooked beans or peas.  Nuts sometimes get a bad rap because of their fat content, but they contain healthy types of fat - monounsaturated fat and omega-3 fatty acids. They're high in calories, however, so eat them in moderation.  Try adding them to stir-fries, salads or cereals.  Soybean-based products, such as tofu and tempeh, can be a good alternative to meat because they contain all of the amino acids your body needs to make a complete protein, just like meat. Fats and oils: 2 to 3 servings a day Fat helps your body absorb essential vitamins and helps your body's immune system. But too much fat increases your risk of heart disease, diabetes and obesity. The DASH diet strives for a healthy balance by limiting total fat to less than 30 percent of daily calories from fat, with a focus on the healthier monounsaturated fats. Examples of one serving include 1 teaspoon soft margarine, 1 tablespoon mayonnaise or 2 tablespoons salad dressing. Saturated fat and trans fat are the main dietary culprits in increasing your risk of coronary artery disease. DASH helps keep your daily saturated fat to less than 6 percent of your total calories by limiting use of meat, butter, cheese, whole milk, cream and eggs in your diet, along with foods made from lard, solid shortenings, and palm and coconut oils.  Avoid trans fat, commonly found in such processed foods as crackers, baked goods and fried items.  Read food labels on margarine and salad dressing so that you can choose those that are lowest in saturated fat and free of trans fat. Sweets: 5 servings or fewer a week You don't have to banish sweets entirely while following the DASH diet - just go easy on them. Examples of one serving include 1 tablespoon sugar, jelly or jam, 1/2 cup sorbet, or 1 cup lemonade. When you eat sweets, choose those that are fat-free or low-fat, such as sorbets, fruit ices, jelly beans, hard candy, graham crackers or low-fat cookies.  Artificial sweeteners such as aspartame (NutraSweet, Equal) and sucralose (Splenda) may help satisfy your sweet tooth while sparing the sugar. But remember that you still must use them sensibly. It's OK to swap a diet cola for a regular  cola, but not in place of a more nutritious beverage such as low-fat milk or even plain water.  Cut back on added sugar, which has no nutritional value but can pack on calories. DASH diet: Alcohol and caffeine Drinking too much alcohol can increase blood pressure. The Dietary Guidelines for Americans recommends that men limit alcohol to no more than two drinks a day and women to one or less. The DASH diet doesn't address caffeine consumption. The influence of caffeine on blood pressure remains unclear. But caffeine can cause your blood pressure to rise at least temporarily. If you already have high blood pressure or  if you think caffeine is affecting your blood pressure, talk to your doctor about your caffeine consumption. DASH diet and weight loss While the DASH diet is not a weight-loss program, you may indeed lose unwanted pounds because it can help guide you toward healthier food choices. The DASH diet generally includes about 2,000 calories a day. If you're trying to lose weight, you may need to eat fewer calories. You may also need to adjust your serving goals based on your individual circumstances - something your health care team can help you decide. Tips to cut back on sodium The foods at the core of the DASH diet are naturally low in sodium. So just by following the DASH diet, you're likely to reduce your sodium intake. You also reduce sodium further by: Using sodium-free spices or flavorings with your food instead of salt  Not adding salt when cooking rice, pasta or hot cereal  Rinsing canned foods to remove some of the sodium  Buying foods labeled "no salt added," "sodium-free," "low sodium" or "very low sodium" One teaspoon of table salt has 2,325 mg of sodium. When you read food labels, you may be surprised at just how much sodium some processed foods contain. Even low-fat soups, canned vegetables, ready-to-eat cereals and sliced [place] from the local deli - foods you may have considered  healthy - often have lots of sodium. You may notice a difference in taste when you choose low-sodium food and beverages. If things seem too bland, gradually introduce low-sodium foods and cut back on table salt until you reach your sodium goal. That'll give your palate time to adjust. Using salt-free seasoning blends or herbs and spices may also ease the transition. It can take several weeks for your taste buds to get used to less salty foods. Putting the pieces of the DASH diet together Try these strategies to get started on the DASH diet:  Change gradually. If you now eat only one or two servings of fruits or vegetables a day, try to add a serving at lunch and one at dinner. Rather than switching to all whole grains, start by making one or two of your grain servings whole grains. Increasing fruits, vegetables and whole grains gradually can also help prevent bloating or diarrhea that may occur if you aren't used to eating a diet with lots of fiber. You can also try over-the-counter products to help reduce gas from beans and vegetables.  Reward successes and forgive slip-ups. Reward yourself with a nonfood treat for your accomplishments - rent a movie, purchase a book or get together with a friend. Everyone slips, especially when learning something new. Remember that changing your lifestyle is a long-term process. Find out what triggered your setback and then just pick up where you left off with the DASH diet.  Add physical activity. To boost your blood pressure lowering efforts even more, consider increasing your physical activity in addition to following the DASH diet. Combining both the DASH diet and physical activity makes it more likely that you'll reduce your blood pressure.  Get support if you need it. If you're having trouble sticking to your diet, talk to your doctor or dietitian about it. You might get some tips that will help you stick to the DASH diet. Remember, healthy eating isn't an  all-or-nothing proposition. What's most important is that, on average, you eat healthier foods with plenty of variety - both to keep your diet nutritious and to avoid boredom or extremes. And with the DASH diet,  you can have both.

## 2017-07-20 DIAGNOSIS — Z7189 Other specified counseling: Secondary | ICD-10-CM | POA: Insufficient documentation

## 2017-07-24 LAB — ALDOSTERONE + RENIN ACTIVITY W/ RATIO
ALDOS/RENIN RATIO: 17.9 (ref 0.0–30.0)
ALDOSTERONE: 6 ng/dL (ref 0.0–30.0)
Renin: 0.335 ng/mL/hr (ref 0.167–5.380)

## 2017-07-24 LAB — BASIC METABOLIC PANEL
BUN/Creatinine Ratio: 17 (ref 9–20)
BUN: 17 mg/dL (ref 6–24)
CALCIUM: 9.6 mg/dL (ref 8.7–10.2)
CHLORIDE: 100 mmol/L (ref 96–106)
CO2: 22 mmol/L (ref 20–29)
Creatinine, Ser: 1.03 mg/dL (ref 0.76–1.27)
GFR calc non Af Amer: 90 mL/min/{1.73_m2} (ref >59)
GFR, EST AFRICAN AMERICAN: 104 mL/min/{1.73_m2} (ref >59)
GLUCOSE: 93 mg/dL (ref 65–99)
Potassium: 4.5 mmol/L (ref 3.5–5.2)
Sodium: 141 mmol/L (ref 134–144)

## 2018-02-07 ENCOUNTER — Encounter

## 2018-04-24 ENCOUNTER — Encounter: Payer: Self-pay | Admitting: Physician Assistant

## 2018-04-24 ENCOUNTER — Ambulatory Visit (INDEPENDENT_AMBULATORY_CARE_PROVIDER_SITE_OTHER): Payer: 59 | Admitting: Physician Assistant

## 2018-04-24 DIAGNOSIS — G4700 Insomnia, unspecified: Secondary | ICD-10-CM | POA: Diagnosis not present

## 2018-04-24 DIAGNOSIS — F429 Obsessive-compulsive disorder, unspecified: Secondary | ICD-10-CM

## 2018-04-24 DIAGNOSIS — F411 Generalized anxiety disorder: Secondary | ICD-10-CM | POA: Diagnosis not present

## 2018-04-24 MED ORDER — BUSPIRONE HCL 15 MG PO TABS: ORAL_TABLET | ORAL | 1 refills | Status: DC

## 2018-04-24 MED ORDER — ESZOPICLONE 3 MG PO TABS: 3.0000 mg | ORAL_TABLET | Freq: Every evening | ORAL | 1 refills | Status: DC | PRN

## 2018-04-24 NOTE — Progress Notes (Signed)
[facility]  Patient ID: Albert [name],  MRN: [id number]  PCP: Veryl Speak, FNP  Date of Evaluation: 04/26/2018 Time spent:15 minutes  Chief Complaint:  Chief Complaint    Anxiety      HISTORY/CURRENT STATUS: HPI Wants to discuss meds.  He wanted to get off the SSRIs.  "I've been on them for 20 years.  I've been off the Luvox for 2 months but I'll get into the 'thought loops' and is having anxiety randomly.  I really don't want to go back on them.  Any of the SSRIs have caused sexual side effects and just I am not happy with that."  The worst part of the problem is obsessive thoughts that he is unable to control now that he is been off of the Luvox.  No compulsive behaviors really but unable to function normally because he cannot get certain thoughts out of his head.  He is also having some generalized anxiety with occasional palpitations or shortness of breath but otherwise no full-blown panic attack symptoms.  It does not prevent him from working or having relationship issues.  He sleeps well as long as he has the [place].  It has helped quite a bit.  He is able to go to sleep and stay asleep when taking it.  Patient denies loss of interest in usual activities and is able to enjoy things.  Denies decreased energy or motivation.  Appetite has not changed.  No extreme sadness, tearfulness, or feelings of hopelessness.  Denies any changes in concentration, making decisions or remembering things.  Denies suicidal or homicidal thoughts.  States he he still drinks some but nothing like he was before.  He may have 1 or 2 drinks here and there but it is more occasional than a daily basis.  Individual Medical History/ Review of Systems: Changes? :No    Past medications for mental health diagnoses include: Luvox, Lunesta, Xanax, Wellbutrin, Risperdal, Prozac, Ambien, Deplin, Abilify, naltrexone  Allergies: Sulfamethoxazole and Sulfonamide derivatives  Current  Medications:  Current Outpatient Medications:  .  clomiPHENE (CLOMID) 50 MG tablet, , Disp: , Rfl:  .  telmisartan-hydrochlorothiazide (MICARDIS HCT) 40-12.5 MG tablet, Take 1 tablet by mouth daily., Disp: 90 tablet, Rfl: 3 .  busPIRone (BUSPAR) 15 MG tablet, 1/3 po bid for 7 days, then 2/3 po bid for 7 days, then 1 po bid., Disp: 60 tablet, Rfl: 1 .  DUEXIS 800-26.6 MG TABS, Take 1 tablet by mouth 3 (three) times daily., Disp: , Rfl: 1 .  Eszopiclone 3 MG TABS, Take 1 tablet (3 mg total) by mouth at bedtime as needed. Take immediately before bedtime, Disp: 30 tablet, Rfl: 1 Medication Side Effects: none  Family Medical/ Social History: Changes? No  MENTAL HEALTH EXAM:  There were no vitals taken for this visit.There is no height or weight on file to calculate BMI.  General Appearance: Casual  Eye Contact:  Good  Speech:  Clear and Coherent  Volume:  Normal  Mood:  Euthymic  Affect:  Appropriate  Thought Process:  Goal Directed  Orientation:  Full (Time, Place, and Person)  Thought Content: Logical   Suicidal Thoughts:  No  Homicidal Thoughts:  No  Memory:  WNL  Judgement:  Good  Insight:  Good  Psychomotor Activity:  Normal  Concentration:  Concentration: Good  Recall:  Good  Fund of Knowledge: Good  Language: Good  Assets:  Desire for Improvement  ADL's:  Intact  Cognition: WNL  Prognosis:  Good  DIAGNOSES:    ICD-10-CM   1. Generalized anxiety disorder F41.1   2. Obsessive-compulsive disorder, unspecified type F42.9   3. Insomnia, unspecified type G47.00     Receiving Psychotherapy: Yes Albert [name]   RECOMMENDATIONS: We discussed different options.  He is aware that SSRIs are much more helpful for OCD than any other medications would be.  However because of the sexual side effects we agreed not to restart a drug in that class. Start BuSpar 15 mg 1/3 tablet twice daily for 1 week, then increase to 2/3 tablet twice daily for 1 week, then increase to 1 tablet  twice daily for anxiety. Continue Lunesta as directed.  Discussed sleep hygiene. Continue to see his counselor, Albert Leedsugene [name] Return in 6 weeks.   Melony Overlyeresa Shearon Clonch, PA-C

## 2018-04-29 ENCOUNTER — Ambulatory Visit (INDEPENDENT_AMBULATORY_CARE_PROVIDER_SITE_OTHER): Payer: Self-pay | Admitting: Orthopedic Surgery

## 2018-05-01 ENCOUNTER — Ambulatory Visit (INDEPENDENT_AMBULATORY_CARE_PROVIDER_SITE_OTHER): Payer: 59 | Admitting: Orthopedic Surgery

## 2018-05-01 ENCOUNTER — Encounter (INDEPENDENT_AMBULATORY_CARE_PROVIDER_SITE_OTHER): Payer: Self-pay | Admitting: Orthopedic Surgery

## 2018-05-01 ENCOUNTER — Ambulatory Visit (INDEPENDENT_AMBULATORY_CARE_PROVIDER_SITE_OTHER): Payer: Self-pay

## 2018-05-01 DIAGNOSIS — M25511 Pain in right shoulder: Secondary | ICD-10-CM | POA: Diagnosis not present

## 2018-05-01 DIAGNOSIS — M19011 Primary osteoarthritis, right shoulder: Secondary | ICD-10-CM

## 2018-05-04 ENCOUNTER — Encounter (INDEPENDENT_AMBULATORY_CARE_PROVIDER_SITE_OTHER): Payer: Self-pay | Admitting: Orthopedic Surgery

## 2018-05-04 DIAGNOSIS — M19011 Primary osteoarthritis, right shoulder: Secondary | ICD-10-CM

## 2018-05-04 MED ORDER — BUPIVACAINE HCL 0.25 % IJ SOLN
0.6600 mL | INTRAMUSCULAR | Status: AC | PRN
  Administered 2018-05-04: .66 mL via INTRA_ARTICULAR

## 2018-05-04 MED ORDER — METHYLPREDNISOLONE ACETATE 40 MG/ML IJ SUSP
13.3300 mg | INTRAMUSCULAR | Status: AC | PRN
  Administered 2018-05-04: 13.33 mg via INTRA_ARTICULAR

## 2018-05-04 MED ORDER — LIDOCAINE HCL 1 % IJ SOLN
3.0000 mL | INTRAMUSCULAR | Status: AC | PRN
  Administered 2018-05-04: 3 mL

## 2018-05-04 NOTE — Progress Notes (Signed)
Office Visit Note   Patient: Albert [name]           Date of Birth: May 08, 1976           MRN: [id number] Visit Date: 05/01/2018 Requested by: Veryl Speak, FNP [address] [place], [place] [address] PCP: Veryl Speak, FNP  Subjective: Chief Complaint  Patient presents with  . Right Shoulder - Pain    HPI: Albert [name] is a patient with right shoulder pain.  He has had pain since the summer when he was lifting and throwing his stepson while he was in the pool.  Localizes the pain anteriorly and superiorly in the right shoulder.  Does report a catching.  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  It sore for him to move.  Denies any popping or grinding.  He is able to bench press.  He cannot sleep on the right-hand side.  Not as bad now as it was in the summer.  Does take some occasional over-the-counter medication.              ROS: All systems reviewed are negative as they relate to the chief complaint within the history of present illness.  Patient denies  fevers or chills.   Assessment & Plan: Visit Diagnoses:  1. Right shoulder pain, unspecified chronicity   2. Arthritis of right acromioclavicular joint     Plan: Impression is right shoulder AC joint arthritis possible labral pathology with good rotator cuff strength and no evidence of frozen shoulder.  Plan at this time is to do ultrasound-guided injection into the [facility] joint.  I will see him back in about 8 weeks to decide if we need to do further imaging.  Also want him to take some anti-inflammatories for about a week.  Topicals could also be helpful in this case.  Sample provided.  Follow-Up Instructions: No follow-ups on file.   Orders:  Orders Placed This Encounter  Procedures  . XR Shoulder Right   No orders of the defined types were placed in this encounter.     Procedures: Medium Joint Inj on 05/04/2018 12:26 PM Indications: pain and diagnostic evaluation Details:  27 G 1.5 in needle, ultrasound-guided superior approach Medications: 13.33 mg methylPREDNISolone acetate 40 MG/ML; 0.66 mL bupivacaine 0.25 %; 3 mL lidocaine 1 % Outcome: tolerated well, no immediate complications Procedure, treatment alternatives, risks and benefits explained, specific risks discussed. Consent was given by the patient. Immediately prior to procedure a time out was called to verify the correct patient, procedure, equipment, support staff and site/side marked as required. Patient was prepped and draped in the usual sterile fashion.       Clinical Data: No additional findings.  Objective: Vital Signs: There were no vitals taken for this visit.  Physical Exam:   Constitutional: Patient appears well-developed HEENT:  Head: Normocephalic Eyes:EOM are normal Neck: Normal range of motion Cardiovascular: Normal rate Pulmonary/chest: Effort normal Neurologic: Patient is alert Skin: Skin is warm Psychiatric: Patient has normal mood and affect    Ortho Exam: Ortho exam demonstrates full active and passive range of motion of the cervical spine.  Patient has a little bit of swelling in the joint of his AC joint on the right compared to the left and there is more tenderness on the right compared to the left.  No loss of passive range of motion of the right shoulder versus left with negative apprehension relocation testing on the  right.  No AC joint instability to crossarm adduction testing but there is pain.  Equivocal O'Brien's testing and positive impingement testing on the right.  No coarse grinding or crepitus with labral load testing.  Specialty Comments:  No specialty comments available.  Imaging: No results found.   PMFS History: Patient Active Problem List   Diagnosis Date Noted  . Cardiac risk counseling 07/20/2017  . Closed nondisplaced fracture of neck of fifth metacarpal bone of right hand 07/10/2016  . Attention deficit hyperactivity disorder (ADHD)  06/19/2016  . Thrombosed external hemorrhoid 10/06/2015  . CONTACT DERMATITIS&OTHER ECZEMA DUE UNSPEC CAUSE 10/06/2009  . NECK PAIN, ACUTE 09/13/2009  . TOBACCO USE, QUIT 03/16/2009  . DEPRESSION 02/20/2009   Past Medical History:  Diagnosis Date  . Attention deficit hyperactivity disorder (ADHD) 06/19/2016  . Closed nondisplaced fracture of neck of fifth metacarpal bone of right hand 07/10/2016  . CONTACT DERMATITIS&OTHER ECZEMA DUE UNSPEC CAUSE 10/06/2009   Qualifier: Diagnosis of  By: Yetta BarreJones MD, Bernadene Bellhomas L.   . Depression   . DEPRESSION 02/20/2009   Qualifier: Diagnosis of  By: Yetta BarreJones MD, Bernadene Bellhomas L.   . Hyperlipidemia   . NECK PAIN, ACUTE 09/13/2009   Qualifier: Diagnosis of  By: Yetta BarreJones MD, Bernadene Bellhomas L.   . Thrombosed external hemorrhoid 10/06/2015   Symptoms and exam consistent with thrombosed external hemorrhoid. Patient declines incise and drain at this time. Treat conservatively with hydrocortisone rectal cream and Tylenol 3 as needed for discomfort. Recommend sitz bath. Follow-up if symptoms worsen or do not improve or would like to seek interventional procedure.   . TOBACCO USE, QUIT 03/16/2009   Qualifier: Diagnosis of  By: Tora PerchesGrace, Vanessa      Family History  Problem Relation Age of Onset  . Healthy Mother   . Hyperlipidemia Paternal Grandfather   . Heart disease Paternal Grandfather   . Heart failure Paternal Grandfather     Past Surgical History:  Procedure Laterality Date  . HERNIA REPAIR     Social History   Occupational History  . Occupation: Sales  Tobacco Use  . Smoking status: Never Smoker  . Smokeless tobacco: Never Used  Substance and Sexual Activity  . Alcohol use: Yes    Alcohol/week: 14.0 standard drinks    Types: 14 Cans of beer per week    Comment: per week  . Drug use: No  . Sexual activity: Yes

## 2018-05-25 ENCOUNTER — Encounter: Payer: Self-pay | Admitting: Emergency Medicine

## 2018-05-25 DIAGNOSIS — G4700 Insomnia, unspecified: Secondary | ICD-10-CM | POA: Insufficient documentation

## 2018-05-25 DIAGNOSIS — F429 Obsessive-compulsive disorder, unspecified: Secondary | ICD-10-CM | POA: Insufficient documentation

## 2018-06-05 ENCOUNTER — Ambulatory Visit (INDEPENDENT_AMBULATORY_CARE_PROVIDER_SITE_OTHER): Payer: Self-pay | Admitting: Orthopedic Surgery

## 2018-06-12 ENCOUNTER — Encounter (INDEPENDENT_AMBULATORY_CARE_PROVIDER_SITE_OTHER): Payer: Self-pay | Admitting: Orthopedic Surgery

## 2018-06-12 ENCOUNTER — Ambulatory Visit: Payer: Self-pay | Admitting: Physician Assistant

## 2018-06-12 ENCOUNTER — Ambulatory Visit (INDEPENDENT_AMBULATORY_CARE_PROVIDER_SITE_OTHER): Payer: 59 | Admitting: Orthopedic Surgery

## 2018-06-12 ENCOUNTER — Other Ambulatory Visit (INDEPENDENT_AMBULATORY_CARE_PROVIDER_SITE_OTHER): Payer: Self-pay

## 2018-06-12 DIAGNOSIS — M19011 Primary osteoarthritis, right shoulder: Secondary | ICD-10-CM

## 2018-06-12 DIAGNOSIS — M25511 Pain in right shoulder: Secondary | ICD-10-CM

## 2018-06-12 NOTE — Progress Notes (Signed)
Office Visit Note   Patient: Albert [name]           Date of Birth: 1975-09-29           MRN: [id number] Visit Date: 06/12/2018 Requested by: Veryl Speak, FNP [address] [place], [place] [address] PCP: Veryl Speak, FNP  Subjective: Chief Complaint  Patient presents with  . Right Shoulder - Follow-up    HPI: [name] is a 43 year old patient with right shoulder pain.  Had [facility] joint injection 05/04/2018.  That gave him about 2 weeks of relief but now the pain is recurring.  Duexis helps him.  Denies any mechanical symptoms in the shoulder.  Radiographs do show some spurring of the [facility] joint.              ROS: All systems reviewed are negative as they relate to the chief complaint within the history of present illness.  Patient denies  fevers or chills.   Assessment & Plan: Visit Diagnoses:  1. Arthritis of right acromioclavicular joint     Plan: Impression is right shoulder AC joint arthritis with possible cyst formation and spur formation.  He has had an injection but presents with continued pain.  Plan MRI arthrogram to evaluate labral rotator cuff and particularly potential degeneration and cyst formation around that [facility] joint.  He may be looking at [facility] joint resection if no other pathology is present.  He also has been avoiding aggravating types of fitness exercises to the [facility] joint.  Follow-Up Instructions: Return for after MRI.   Orders:  No orders of the defined types were placed in this encounter.  No orders of the defined types were placed in this encounter.     Procedures: No procedures performed   Clinical Data: No additional findings.  Objective: Vital Signs: There were no vitals taken for this visit.  Physical Exam:   Constitutional: Patient appears well-developed HEENT:  Head: Normocephalic Eyes:EOM are normal Neck: Normal range of motion Cardiovascular: Normal rate Pulmonary/chest: Effort normal Neurologic: Patient is alert Skin:  Skin is warm Psychiatric: Patient has normal mood and affect    Ortho Exam: Ortho exam demonstrates full active and passive range of motion of that right shoulder but with some tenderness over the right AC joint compared to the left.  Patient has some pain with crossarm adduction.  Small cyst is palpable at the [facility] joint on the right.  Rotator cuff strength is excellent infraspinatus supraspinatus subscap muscle testing and there is no restriction of active or passive range of motion of the shoulder.  Specialty Comments:  No specialty comments available.  Imaging: No results found.   PMFS History: Patient Active Problem List   Diagnosis Date Noted  . OCD (obsessive compulsive disorder) 05/25/2018  . Insomnia 05/25/2018  . Cardiac risk counseling 07/20/2017  . Closed nondisplaced fracture of neck of fifth metacarpal bone of right hand 07/10/2016  . Attention deficit hyperactivity disorder (ADHD) 06/19/2016  . Thrombosed external hemorrhoid 10/06/2015  . CONTACT DERMATITIS&OTHER ECZEMA DUE UNSPEC CAUSE 10/06/2009  . NECK PAIN, ACUTE 09/13/2009  . TOBACCO USE, QUIT 03/16/2009  . DEPRESSION 02/20/2009   Past Medical History:  Diagnosis Date  . Attention deficit hyperactivity disorder (ADHD) 06/19/2016  . Closed nondisplaced fracture of neck of fifth metacarpal bone of right hand 07/10/2016  . CONTACT DERMATITIS&OTHER ECZEMA DUE UNSPEC CAUSE 10/06/2009   Qualifier: Diagnosis of  By: Yetta Barre MD, Bernadene Bell.   . Depression   . DEPRESSION  02/20/2009   Qualifier: Diagnosis of  By: Yetta Barre MD, Bernadene Bell.   . Hyperlipidemia   . NECK PAIN, ACUTE 09/13/2009   Qualifier: Diagnosis of  By: Yetta Barre MD, Bernadene Bell.   . Thrombosed external hemorrhoid 10/06/2015   Symptoms and exam consistent with thrombosed external hemorrhoid. Patient declines incise and drain at this time. Treat conservatively with hydrocortisone rectal cream and Tylenol 3 as needed for discomfort. Recommend sitz bath. Follow-up if symptoms  worsen or do not improve or would like to seek interventional procedure.   . TOBACCO USE, QUIT 03/16/2009   Qualifier: Diagnosis of  By: Tora Perches      Family History  Problem Relation Age of Onset  . Healthy Mother   . Hyperlipidemia Paternal Grandfather   . Heart disease Paternal Grandfather   . Heart failure Paternal Grandfather     Past Surgical History:  Procedure Laterality Date  . HERNIA REPAIR     Social History   Occupational History  . Occupation: Sales  Tobacco Use  . Smoking status: Never Smoker  . Smokeless tobacco: Never Used  Substance and Sexual Activity  . Alcohol use: Yes    Alcohol/week: 14.0 standard drinks    Types: 14 Cans of beer per week    Comment: per week  . Drug use: No  . Sexual activity: Yes

## 2018-06-22 ENCOUNTER — Ambulatory Visit
Admission: RE | Admit: 2018-06-22 | Discharge: 2018-06-22 | Disposition: A | Discharge status: Home / Self Care | Payer: 59 | Source: Ambulatory Visit | Attending: Orthopedic Surgery | Admitting: Orthopedic Surgery

## 2018-06-22 DIAGNOSIS — M19011 Primary osteoarthritis, right shoulder: Secondary | ICD-10-CM

## 2018-06-22 DIAGNOSIS — M25511 Pain in right shoulder: Secondary | ICD-10-CM

## 2018-06-22 MED ORDER — IOPAMIDOL (ISOVUE-M 200) INJECTION 41%
15.0000 mL | Freq: Once | INTRAMUSCULAR | Status: AC
  Administered 2018-06-22: 15 mL via INTRA_ARTICULAR

## 2018-06-29 ENCOUNTER — Encounter (INDEPENDENT_AMBULATORY_CARE_PROVIDER_SITE_OTHER): Payer: Self-pay | Admitting: Orthopedic Surgery

## 2018-06-29 ENCOUNTER — Ambulatory Visit (INDEPENDENT_AMBULATORY_CARE_PROVIDER_SITE_OTHER): Payer: 59 | Admitting: Orthopedic Surgery

## 2018-06-29 DIAGNOSIS — M19011 Primary osteoarthritis, right shoulder: Secondary | ICD-10-CM | POA: Diagnosis not present

## 2018-06-29 MED ORDER — BUPIVACAINE HCL 0.25 % IJ SOLN
0.6600 mL | INTRAMUSCULAR | Status: AC | PRN
  Administered 2018-06-29: .66 mL via INTRA_ARTICULAR

## 2018-06-29 MED ORDER — METHYLPREDNISOLONE ACETATE 40 MG/ML IJ SUSP
13.3300 mg | INTRAMUSCULAR | Status: AC | PRN
  Administered 2018-06-29: 13.33 mg via INTRA_ARTICULAR

## 2018-06-29 MED ORDER — LIDOCAINE HCL 1 % IJ SOLN
3.0000 mL | INTRAMUSCULAR | Status: AC | PRN
  Administered 2018-06-29: 3 mL

## 2018-06-29 NOTE — Progress Notes (Signed)
Office Visit Note   Patient: Albert [name]           Date of Birth: [birth year]           MRN: [id number] Visit Date: 06/29/2018 Requested by: Veryl Speak, FNP [address] [place], [place] [address] PCP: Veryl Speak, FNP  Subjective: Chief Complaint  Patient presents with  . Right Shoulder - Pain, Follow-up    MRI Right Shoulder Review    HPI: Albert [name] is a 43 year old patient with right shoulder pain.  MRI scan has been performed since he was last seen.  Did have an injection ultrasound-guided into the [facility] joint at the end of November.  This helped him significantly for about 3 weeks.  MRI scan is reviewed and it does show edema and arthritis in the [facility] joint.  The remainder in MRI is image structures rotator cuff and labrum are normal.  He would like to continue to work out.              ROS: All systems reviewed are negative as they relate to the chief complaint within the history of present illness.  Patient denies  fevers or chills.   Assessment & Plan: Visit Diagnoses:  1. Arthritis of right acromioclavicular joint     Plan: Impression is right shoulder AC joint arthritis.  Plan is second and final AC joint injection today.  Discussed with him ways to avoid loading the [facility] joint when he works out.  Neck step if symptoms warrant would be arthroscopic distal clavicle excision.  That procedure is discussed today.  All questions answered.  Follow-Up Instructions: Return if symptoms worsen or fail to improve.   Orders:  No orders of the defined types were placed in this encounter.  No orders of the defined types were placed in this encounter.     Procedures: Medium Joint Inj: R acromioclavicular on 06/29/2018 8:28 PM Indications: pain and diagnostic evaluation Details: 27 G 1.5 in needle, ultrasound-guided superior approach Medications: 13.33 mg methylPREDNISolone acetate 40 MG/ML; 0.66 mL bupivacaine 0.25 %; 3 mL lidocaine 1 % Outcome: tolerated well, no  immediate complications Procedure, treatment alternatives, risks and benefits explained, specific risks discussed. Consent was given by the patient. Immediately prior to procedure a time out was called to verify the correct patient, procedure, equipment, support staff and site/side marked as required. Patient was prepped and draped in the usual sterile fashion.       Clinical Data: No additional findings.  Objective: Vital Signs: There were no vitals taken for this visit.  Physical Exam:   Constitutional: Patient appears well-developed HEENT:  Head: Normocephalic Eyes:EOM are normal Neck: Normal range of motion Cardiovascular: Normal rate Pulmonary/chest: Effort normal Neurologic: Patient is alert Skin: Skin is warm Psychiatric: Patient has normal mood and affect    Ortho Exam: Ortho exam demonstrates full active and passive range of motion of both shoulders.  Does have tenderness right AC joint compared to the left as well as some pain with crossarm adduction.  Cuff strength is good on the right side infraspinatus supraspinatus and subscap muscle testing with negative O'Brien's testing and negative impingement signs.  Specialty Comments:  No specialty comments available.  Imaging: No results found.   PMFS History: Patient Active Problem List   Diagnosis Date Noted  . OCD (obsessive compulsive disorder) 05/25/2018  . Insomnia 05/25/2018  . Cardiac risk counseling 07/20/2017  . Closed nondisplaced fracture of neck of fifth metacarpal bone of right  hand 07/10/2016  . Attention deficit hyperactivity disorder (ADHD) 06/19/2016  . Thrombosed external hemorrhoid 10/06/2015  . CONTACT DERMATITIS&OTHER ECZEMA DUE UNSPEC CAUSE 10/06/2009  . NECK PAIN, ACUTE 09/13/2009  . TOBACCO USE, QUIT 03/16/2009  . DEPRESSION 02/20/2009   Past Medical History:  Diagnosis Date  . Attention deficit hyperactivity disorder (ADHD) 06/19/2016  . Closed nondisplaced fracture of neck of fifth  metacarpal bone of right hand 07/10/2016  . CONTACT DERMATITIS&OTHER ECZEMA DUE UNSPEC CAUSE 10/06/2009   Qualifier: Diagnosis of  By: Yetta BarreJones MD, Bernadene Bellhomas L.   . Depression   . DEPRESSION 02/20/2009   Qualifier: Diagnosis of  By: Yetta BarreJones MD, Bernadene Bellhomas L.   . Hyperlipidemia   . NECK PAIN, ACUTE 09/13/2009   Qualifier: Diagnosis of  By: Yetta BarreJones MD, Bernadene Bellhomas L.   . Thrombosed external hemorrhoid 10/06/2015   Symptoms and exam consistent with thrombosed external hemorrhoid. Patient declines incise and drain at this time. Treat conservatively with hydrocortisone rectal cream and Tylenol 3 as needed for discomfort. Recommend sitz bath. Follow-up if symptoms worsen or do not improve or would like to seek interventional procedure.   . TOBACCO USE, QUIT 03/16/2009   Qualifier: Diagnosis of  By: Tora PerchesGrace, Vanessa      Family History  Problem Relation Age of Onset  . Healthy Mother   . Hyperlipidemia Paternal Grandfather   . Heart disease Paternal Grandfather   . Heart failure Paternal Grandfather     Past Surgical History:  Procedure Laterality Date  . HERNIA REPAIR     Social History   Occupational History  . Occupation: Sales  Tobacco Use  . Smoking status: Never Smoker  . Smokeless tobacco: Never Used  Substance and Sexual Activity  . Alcohol use: Yes    Alcohol/week: 14.0 standard drinks    Types: 14 Cans of beer per week    Comment: per week  . Drug use: No  . Sexual activity: Yes

## 2018-07-14 ENCOUNTER — Encounter: Payer: Self-pay | Admitting: Physician Assistant

## 2018-07-14 ENCOUNTER — Encounter

## 2018-07-14 ENCOUNTER — Ambulatory Visit (INDEPENDENT_AMBULATORY_CARE_PROVIDER_SITE_OTHER): Payer: 59 | Admitting: Physician Assistant

## 2018-07-14 DIAGNOSIS — F331 Major depressive disorder, recurrent, moderate: Secondary | ICD-10-CM | POA: Diagnosis not present

## 2018-07-14 DIAGNOSIS — F902 Attention-deficit hyperactivity disorder, combined type: Secondary | ICD-10-CM

## 2018-07-14 DIAGNOSIS — F411 Generalized anxiety disorder: Secondary | ICD-10-CM

## 2018-07-14 DIAGNOSIS — F422 Mixed obsessional thoughts and acts: Secondary | ICD-10-CM | POA: Diagnosis not present

## 2018-07-14 MED ORDER — AMPHETAMINE-DEXTROAMPHETAMINE 10 MG PO TABS: 10.0000 mg | ORAL_TABLET | Freq: Every day | ORAL | 0 refills | Status: DC

## 2018-07-14 MED ORDER — FLUVOXAMINE MALEATE 100 MG PO TABS: 100.0000 mg | ORAL_TABLET | Freq: Every day | ORAL | 1 refills | Status: DC

## 2018-07-14 MED ORDER — ESZOPICLONE 3 MG PO TABS: 3.0000 mg | ORAL_TABLET | Freq: Every evening | ORAL | 5 refills | Status: DC | PRN

## 2018-07-14 NOTE — Progress Notes (Signed)
[facility]  Patient ID: Albert [name],  MRN: [id number]  PCP: Veryl Speak, FNP  Date of Evaluation: 07/14/2018 Time spent:15 minutes  Chief Complaint:  Chief Complaint    Follow-up; Anxiety; Depression      HISTORY/CURRENT STATUS: HPI Here for 3 month med check.  Hasn't done well not being on SSRI. He had wanted to be off it b/c sexual SE. He was having panic attacks which he hasn't had in 10 years.  The Buspar was not really effective and made him feel like a zombie so he stopped it.  He was also having more depressive symptoms throughout this.  No energy or motivation.  He received promotion at work and he does not want anything to mess that up.  A week or so ago, he started back on the Luvox at 100 mg and so far there have been no sexual side effects and he states within a day he felt better immediately, especially with the anxiety.  He has had no panic attacks since then.  He has also been using an alpha stim which has helped with anxiety.  States he is not drinking any alcohol for the past month or so.  Has not been taking naltrexone.  Patient denies loss of interest in usual activities and is able to enjoy things.  Denies decreased energy or motivation.  Appetite has not changed.  No extreme sadness, tearfulness, or feelings of hopelessness.  Denies any changes in concentration, making decisions or remembering things.  Denies suicidal or homicidal thoughts.  He sleeps well as long as he has the [place].  Individual Medical History/ Review of Systems: Changes? :No    Past medications for mental health diagnoses include: Luvox, Lunesta, Xanax, Wellbutrin, Risperdal, Prozac, Ambien, Deplin, Abilify, naltrexone  Allergies: Sulfamethoxazole and Sulfonamide derivatives  Current Medications:  Current Outpatient Medications:  .  amphetamine-dextroamphetamine (ADDERALL) 10 MG tablet, Take 10 mg by mouth daily with breakfast., Disp: , Rfl:  .  clomiPHENE (CLOMID) 50 MG  tablet, , Disp: , Rfl:  .  Eszopiclone 3 MG TABS, Take 1 tablet (3 mg total) by mouth at bedtime as needed. Take immediately before bedtime, Disp: 30 tablet, Rfl: 1 .  L-Methylfolate-Algae (DEPLIN 15 PO), Take by mouth., Disp: , Rfl:  .  telmisartan-hydrochlorothiazide (MICARDIS HCT) 40-12.5 MG tablet, Take 1 tablet by mouth daily., Disp: 90 tablet, Rfl: 3 .  [START ON 08/03/2018] amphetamine-dextroamphetamine (ADDERALL) 10 MG tablet, Take 1 tablet (10 mg total) by mouth daily with breakfast., Disp: 30 tablet, Rfl: 0 .  [START ON 08/31/2018] amphetamine-dextroamphetamine (ADDERALL) 10 MG tablet, Take 1 tablet (10 mg total) by mouth daily with breakfast., Disp: 30 tablet, Rfl: 0 .  [START ON 09/30/2018] amphetamine-dextroamphetamine (ADDERALL) 10 MG tablet, Take 1 tablet (10 mg total) by mouth daily with breakfast., Disp: 30 tablet, Rfl: 0 .  DU[place]IS 800-26.6 MG TABS, Take 1 tablet by mouth 3 (three) times daily., Disp: , Rfl: 1 .  Eszopiclone (ESZOPICLONE) 3 MG TABS, Take 1 tablet (3 mg total) by mouth at bedtime as needed. Take immediately before bedtime, Disp: 30 tablet, Rfl: 5 .  fluvoxaMINE (LUVOX) 100 MG tablet, Take 1 tablet (100 mg total) by mouth at bedtime., Disp: 90 tablet, Rfl: 1 .  naltrexone (DEPADE) 50 MG tablet, Take by mouth every morning., Disp: , Rfl:  Medication Side Effects: none he has had a slight stomach upset with loose stools since starting back on the Luvox but it only lasted for a few  days and he is better now.  Family Medical/ Social History: Changes?  Promotion at work  MENTAL HEALTH EXAM:  There were no vitals taken for this visit.There is no height or weight on file to calculate BMI.  General Appearance: Casual and Well Groomed  Eye Contact:  Good  Speech:  Clear and Coherent  Volume:  Normal  Mood:  Euthymic  Affect:  Appropriate  Thought Process:  Goal Directed  Orientation:  Full (Time, Place, and Person)  Thought Content: Logical   Suicidal Thoughts:  No   Homicidal Thoughts:  No  Memory:  WNL  Judgement:  Good  Insight:  Good  Psychomotor Activity:  Normal  Concentration:  Concentration: Good and Attention Span: Good  Recall:  Good  Fund of Knowledge: Good  Language: Good  Assets:  Desire for Improvement  ADL's:  Intact  Cognition: WNL  Prognosis:  Good    DIAGNOSES:    ICD-10-CM   1. Generalized anxiety disorder F41.1   2. Major depressive disorder, recurrent episode, moderate (HCC) F33.1   3. Mixed obsessional thoughts and acts F42.2   4. Attention deficit hyperactivity disorder (ADHD), combined type F90.2     Receiving Psychotherapy: No    RECOMMENDATIONS: Continue Luvox 100 mg p.o. nightly.  He knows to call within a month if we need to go up but we agreed to keep it at the lowest possible dose. Continue Adderall 10 mg every morning. Continue Lunesta 3 mg p.o. nightly as needed Continue Deplin 15 mg daily. Return in 6 months or sooner as needed.   [name], PA-C

## 2018-07-22 ENCOUNTER — Other Ambulatory Visit: Payer: Self-pay | Admitting: Cardiology

## 2018-07-23 ENCOUNTER — Telehealth: Payer: Self-pay | Admitting: Cardiology

## 2018-07-23 MED ORDER — TELMISARTAN-HCTZ 40-12.5 MG PO TABS: 1.0000 | ORAL_TABLET | Freq: Every day | ORAL | 0 refills | Status: DC

## 2018-07-23 NOTE — Telephone Encounter (Signed)
30 day supply of telmisartan-hydrochlorothiazide sent to [facility] in [place] as requested with no refills. Further refills will be provided during follow up appointment with Dr. Dulce Sellar on 08/04/2018.

## 2018-07-23 NOTE — Telephone Encounter (Signed)
Patient is out with no refills   1. Which medications need to be refilled? (please list name of each medication and dose if known) Telmisartan 40-12.5mg  tablet  2. Which pharmacy/location (including street and city if local pharmacy) is medication to be sent to?[facility] on [address] [place]  3. Do they need a 30 day or 90 day supply? 30

## 2018-08-03 NOTE — Progress Notes (Signed)
Cardiology Office Note:    Date:  08/04/2018   ID:  Albert [name], DOB 20-Jan-1976, MRN [id number]  PCP:  Veryl Speak, FNP  Cardiologist:  Norman Herrlich, MD    Referring MD: Veryl Speak, FNP    ASSESSMENT:    No diagnosis found. PLAN:    In order of problems listed above:  1. Improved stable blood pressure target continue current treatment he has no evidence of endorgan damage or left ventricular hypertrophy by EKG   Next appointment: 1 year   Medication Adjustments/Labs and Tests Ordered: Current medicines are reviewed at length with the patient today.  Concerns regarding medicines are outlined above.  No orders of the defined types were placed in this encounter.  No orders of the defined types were placed in this encounter.   Chief Complaint  Patient presents with  . Follow-up  . Hypertension  . Hyperlipidemia    History of Present Illness:    Albert [name] is a 43 y.o. male with a hx of hypertension and hyperlipidemia  last seen 07/18/17. Compliance with diet, lifestyle and medications: yes  Infrequently checks blood pressure and is less than 1 20-1 30 systolic.  Tolerates his medication ARB diuretic no side effects no cough rash or gout.  No chest pain shortness of breath or edema.  BP is at target repeat in the office 122/80 continue his current antihypertensive and check renal function and CMP and a plan to see back in the office in 1 year. Past Medical History:  Diagnosis Date  . Attention deficit hyperactivity disorder (ADHD) 06/19/2016  . Closed nondisplaced fracture of neck of fifth metacarpal bone of right hand 07/10/2016  . CONTACT DERMATITIS&OTHER ECZEMA DUE UNSPEC CAUSE 10/06/2009   Qualifier: Diagnosis of  By: Yetta Barre MD, Bernadene Bell.   . Depression   . DEPRESSION 02/20/2009   Qualifier: Diagnosis of  By: Yetta Barre MD, Bernadene Bell.   . Hyperlipidemia   . NECK PAIN, ACUTE 09/13/2009   Qualifier: Diagnosis of  By: Yetta Barre MD, Bernadene Bell.   . Thrombosed external  hemorrhoid 10/06/2015   Symptoms and exam consistent with thrombosed external hemorrhoid. Patient declines incise and drain at this time. Treat conservatively with hydrocortisone rectal cream and Tylenol 3 as needed for discomfort. Recommend sitz bath. Follow-up if symptoms worsen or do not improve or would like to seek interventional procedure.   . TOBACCO USE, QUIT 03/16/2009   Qualifier: Diagnosis of  By: Tora Perches      Past Surgical History:  Procedure Laterality Date  . HERNIA REPAIR      Current Medications: Current Meds  Medication Sig  . [START ON 09/30/2018] amphetamine-dextroamphetamine (ADDERALL) 10 MG tablet Take 1 tablet (10 mg total) by mouth daily with breakfast.  . clomiPHENE (CLOMID) 50 MG tablet Take 25 mg by mouth daily.   . fluvoxaMINE (LUVOX) 100 MG tablet Take 1 tablet (100 mg total) by mouth at bedtime.  Marland Kitchen L-Methylfolate-Algae (DEPLIN 15 PO) Take 15 mg by mouth at bedtime.   . naltrexone (DEPADE) 50 MG tablet Take 50 mg by mouth daily as needed.   Marland Kitchen telmisartan-hydrochlorothiazide (MICARDIS HCT) 40-12.5 MG tablet Take 1 tablet by mouth daily.  Marland Kitchen zolpidem (AMBIEN) 5 MG tablet Take 5 mg by mouth at bedtime.     Allergies:   Sulfamethoxazole and Sulfonamide derivatives   Social History   Socioeconomic History  . Marital status: Widowed    Spouse name: Not on file  . Number of children: 1  .  Years of education: 58  . Highest education level: Not on file  Occupational History  . Occupation: Airline pilot  Social Needs  . Financial resource strain: Not on file  . Food insecurity:    Worry: Not on file    Inability: Not on file  . Transportation needs:    Medical: Not on file    Non-medical: Not on file  Tobacco Use  . Smoking status: Never Smoker  . Smokeless tobacco: Never Used  Substance and Sexual Activity  . Alcohol use: Not Currently    Alcohol/week: 0.0 standard drinks  . Drug use: No  . Sexual activity: Yes  Lifestyle  . Physical activity:     Days per week: Not on file    Minutes per session: Not on file  . Stress: Not on file  Relationships  . Social connections:    Talks on phone: Not on file    Gets together: Not on file    Attends religious service: Not on file    Active member of club or organization: Not on file    Attends meetings of clubs or organizations: Not on file    Relationship status: Not on file  Other Topics Concern  . Not on file  Social History Narrative   Fun: Exercise, play music, sports     Family History: The patient's family history includes Healthy in his mother; Heart disease in his paternal grandfather; Heart failure in his paternal grandfather; Hyperlipidemia in his paternal grandfather. ROS:   Please see the history of present illness.    All other systems reviewed and are negative.  EKGs/Labs/Other Studies Reviewed:    The following studies were reviewed today:  EKG:  EKG ordered today.  The ekg ordered today demonstrates sinus rhythm normal no evidence of left ventricular hypertrophy  Recent Labs: No results found for requested labs within last 8760 hours.  Recent Lipid Panel    Component Value Date/Time   CHOL 228 (H) 02/20/2009 0000   TRIG 125.0 02/20/2009 0000   HDL 55.40 02/20/2009 0000   CHOLHDL 4 02/20/2009 0000   VLDL 25.0 02/20/2009 0000   LDLDIRECT 154.7 02/20/2009 0000    Physical Exam:    VS:  BP 122/80 (BP Location: Right Arm, Patient Position: Sitting, Cuff Size: Normal)   Ht 6' (1.829 m)   Wt 199 lb 12.8 oz (90.6 kg)   SpO2 97%   BMI 27.10 kg/m     Wt Readings from Last 3 Encounters:  08/04/18 199 lb 12.8 oz (90.6 kg)  07/18/17 197 lb (89.4 kg)  10/06/15 194 lb (88 kg)     GEN:  Well nourished, well developed in no acute distress HEENT: Normal NECK: No JVD; No carotid bruits LYMPHATICS: No lymphadenopathy CARDIAC: RRR, no murmurs, rubs, gallops RESPIRATORY:  Clear to auscultation without rales, wheezing or rhonchi  ABDOMEN: Soft, non-tender,  non-distended MUSCULOSKELETAL:  No edema; No deformity  SKIN: Warm and dry NEUROLOGIC:  Alert and oriented x 3 PSYCHIATRIC:  Normal affect    Signed, Norman Herrlich, MD  08/04/2018 11:24 AM    [facility]

## 2018-08-04 ENCOUNTER — Ambulatory Visit (INDEPENDENT_AMBULATORY_CARE_PROVIDER_SITE_OTHER): Payer: 59 | Admitting: Cardiology

## 2018-08-04 ENCOUNTER — Encounter: Payer: Self-pay | Admitting: Cardiology

## 2018-08-04 VITALS — BP 122/80 | Ht 72.0 in | Wt 199.8 lb

## 2018-08-04 DIAGNOSIS — E785 Hyperlipidemia, unspecified: Secondary | ICD-10-CM | POA: Diagnosis not present

## 2018-08-04 DIAGNOSIS — I10 Essential (primary) hypertension: Secondary | ICD-10-CM | POA: Diagnosis not present

## 2018-08-04 MED ORDER — TELMISARTAN-HCTZ 40-12.5 MG PO TABS: 1.0000 | ORAL_TABLET | Freq: Every day | ORAL | 3 refills | Status: DC

## 2018-08-04 NOTE — Addendum Note (Signed)
Addended by: Crist Fat on: 08/04/2018 11:40 AM   Modules accepted: Orders

## 2018-08-04 NOTE — Addendum Note (Signed)
Addended by: Lamona Curl on: 08/04/2018 02:13 PM   Modules accepted: Orders

## 2018-08-04 NOTE — Patient Instructions (Signed)
Medication Instructions:  Your physician recommends that you continue on your current medications as directed. Please refer to the Current Medication list given to you today.  If you need a refill on your cardiac medications before your next appointment, please call your pharmacy.   Lab work: Your physician recommends that you have a BMP drawn today.  If you have labs (blood work) drawn today and your tests are completely normal, you will receive your results only by: Marland Kitchen MyChart Message (if you have MyChart) OR . A paper copy in the mail If you have any lab test that is abnormal or we need to change your treatment, we will call you to review the results.  Testing/Procedures: NONE  Follow-Up: At [facility], you and your health needs are our priority.  As part of our continuing mission to provide you with exceptional heart care, we have created designated Provider Care Teams.  These Care Teams include your primary Cardiologist (physician) and Advanced Practice Providers (APPs -  Physician Assistants and Nurse Practitioners) who all work together to provide you with the care you need, when you need it. You will need a follow up appointment in 1 years.  Please call our office 2 months in advance to schedule this appointment.

## 2018-08-04 NOTE — Addendum Note (Signed)
Addended by: Crist Fat on: 08/04/2018 02:01 PM   Modules accepted: Orders

## 2018-08-05 LAB — BASIC METABOLIC PANEL
BUN/Creatinine Ratio: 18 (ref 9–20)
BUN: 17 mg/dL (ref 6–24)
CO2: 24 mmol/L (ref 20–29)
Calcium: 9.1 mg/dL (ref 8.7–10.2)
Chloride: 99 mmol/L (ref 96–106)
Creatinine, Ser: 0.95 mg/dL (ref 0.76–1.27)
GFR calc Af Amer: 114 mL/min/{1.73_m2} (ref >59)
GFR calc non Af Amer: 98 mL/min/{1.73_m2} (ref >59)
Glucose: 88 mg/dL (ref 65–99)
Potassium: 4.2 mmol/L (ref 3.5–5.2)
Sodium: 138 mmol/L (ref 134–144)

## 2018-08-06 NOTE — Addendum Note (Signed)
Addended by: Lamona Curl on: 08/06/2018 08:45 AM   Modules accepted: Orders

## 2018-08-13 ENCOUNTER — Other Ambulatory Visit: Payer: Self-pay | Admitting: Physician Assistant

## 2018-08-14 ENCOUNTER — Other Ambulatory Visit: Payer: Self-pay | Admitting: Physician Assistant

## 2018-10-07 ENCOUNTER — Ambulatory Visit: Payer: 59 | Admitting: Physician Assistant

## 2018-10-07 ENCOUNTER — Other Ambulatory Visit: Payer: Self-pay

## 2018-10-07 ENCOUNTER — Encounter: Payer: Self-pay | Admitting: Physician Assistant

## 2018-10-07 DIAGNOSIS — G4700 Insomnia, unspecified: Secondary | ICD-10-CM | POA: Diagnosis not present

## 2018-10-07 DIAGNOSIS — F422 Mixed obsessional thoughts and acts: Secondary | ICD-10-CM

## 2018-10-07 DIAGNOSIS — F411 Generalized anxiety disorder: Secondary | ICD-10-CM

## 2018-10-07 DIAGNOSIS — F902 Attention-deficit hyperactivity disorder, combined type: Secondary | ICD-10-CM | POA: Diagnosis not present

## 2018-10-07 MED ORDER — ESZOPICLONE 3 MG PO TABS: 3.0000 mg | ORAL_TABLET | Freq: Every evening | ORAL | 5 refills | Status: DC | PRN

## 2018-10-07 MED ORDER — FLUVOXAMINE MALEATE 100 MG PO TABS: 150.0000 mg | ORAL_TABLET | Freq: Every day | ORAL | 0 refills | Status: DC

## 2018-10-07 MED ORDER — METHYLPHENIDATE HCL ER (LA) 10 MG PO CP24: 10.0000 mg | ORAL_CAPSULE | Freq: Every day | ORAL | 0 refills | Status: DC

## 2018-10-07 NOTE — Progress Notes (Signed)
[facility]  Patient ID: Albert [name],  MRN: [id number]  PCP: Veryl Speak, FNP  Date of Evaluation: 10/07/2018 Time spent:15 minutes  Chief Complaint:  Chief Complaint    Follow-up     Virtual Visit via Telephone Note  I connected with patient by a video enabled telemedicine application or telephone, with their informed consent, and verified patient privacy and that I am speaking with the correct person using two identifiers.  I am private, in my home and the patient is home.   I discussed the limitations, risks, security and privacy concerns of performing an evaluation and management service by telephone and the availability of in person appointments. I also discussed with the patient that there may be a patient responsible charge related to this service. The patient expressed understanding and agreed to proceed.   I discussed the assessment and treatment plan with the patient. The patient was provided an opportunity to ask questions and all were answered. The patient agreed with the plan and demonstrated an understanding of the instructions.   The patient was advised to call back or seek an in-person evaluation if the symptoms worsen or if the condition fails to improve as anticipated.  I provided 15 minutes of non-face-to-face time during this encounter.  HISTORY/CURRENT STATUS: HPI For routine med check.  He has been back on the Luvox for 3 months or so now.  His OCD is severe if he does not take it but we had done a trial without it due to decreased libido.  About 1 month ago, he increased the Luvox from 100 mg to 150 mg which has helped with the OCD although he believes he could have even further improvement.  It did decrease his libido but in the past several weeks, that has returned and he is having no sexual side effects.  He is content with the way things are right now.  He denies anhedonia, decreased energy or motivation and denies easy crying or isolating.   Of course he is having to isolate due to the coronavirus pandemic but otherwise there have been no changes.  He is sleeping well.  He changed back from Ambien to the Reedy because he ran out of the Ambien.  The Alfonso Patten is effective.  He will need a refill on that.  He is working from home now due to the coronavirus pandemic.  That is going well.  He asked about changing Adderall to Ritalin extended release.  He has read that it is more effective in someone who has ADHD plus OCD.  The Adderall does work but he wonders if it would be worth trying.  Denies muscle or joint pain, stiffness, or dystonia.  Denies dizziness, syncope, seizures, numbness, tingling, tremor, tics, unsteady gait, slurred speech, confusion.   Individual Medical History/ Review of Systems: Changes? :No    Past medications for mental health diagnoses include: Luvox, Lunesta, Xanax, Wellbutrin, Risperdal, Prozac, Ambien, Deplin, Abilify, naltrexone  Allergies: Sulfamethoxazole and Sulfonamide derivatives  Current Medications:  Current Outpatient Medications:  .  Eszopiclone (ESZOPICLONE) 3 MG TABS, Take 3 mg by mouth at bedtime. Take immediately before bedtime, Disp: , Rfl:  .  L-Methylfolate-Algae (DEPLIN 15 PO), Take 15 mg by mouth at bedtime. , Disp: , Rfl:  .  L-Methylfolate-Algae (DEPLIN 15) 15-90.314 MG CAPS, TAKE 1 CAPSULE BY MOUTH EVERY DAY, Disp: 90 capsule, Rfl: 2 .  telmisartan-hydrochlorothiazide (MICARDIS HCT) 40-12.5 MG tablet, Take 1 tablet by mouth daily., Disp: 90 tablet, Rfl: 3 .  clomiPHENE (CLOMID) 50 MG tablet, Take 25 mg by mouth daily. , Disp: , Rfl:  .  Eszopiclone 3 MG TABS, Take 1 tablet (3 mg total) by mouth at bedtime as needed. Take immediately before bedtime, Disp: 30 tablet, Rfl: 5 .  fluvoxaMINE (LUVOX) 100 MG tablet, Take 1.5 tablets (150 mg total) by mouth at bedtime., Disp: 135 tablet, Rfl: 0 .  methylphenidate (RITALIN LA) 10 MG 24 hr capsule, Take 1 capsule (10 mg total) by mouth  daily., Disp: 30 capsule, Rfl: 0 Medication Side Effects: none  Family Medical/ Social History: Changes? Yes working from home now b/c coronavirus pandemic  MENTAL HEALTH EXAM:  There were no vitals taken for this visit.There is no height or weight on file to calculate BMI.  General Appearance: Unable to assess  Eye Contact:  Unable to assess  Speech:  Clear and Coherent  Volume:  Normal  Mood:  Euthymic  Affect:  Unable to assess  Thought Process:  Goal Directed  Orientation:  Full (Time, Place, and Person)  Thought Content: Logical   Suicidal Thoughts:  No  Homicidal Thoughts:  No  Memory:  WNL  Judgement:  Good  Insight:  Good  Psychomotor Activity:  Unable to assess  Concentration:  Concentration: Good and Attention Span: Good  Recall:  Good  Fund of Knowledge: Good  Language: Good  Assets:  Desire for Improvement  ADL's:  Intact  Cognition: WNL  Prognosis:  Good    DIAGNOSES:    ICD-10-CM   1. Mixed obsessional thoughts and acts F42.2   2. Generalized anxiety disorder F41.1   3. Insomnia, unspecified type G47.00   4. Attention deficit hyperactivity disorder (ADHD), combined type F90.2     Receiving Psychotherapy: No    RECOMMENDATIONS: It is fine to change to Ritalin extended release and see how he does with that. Start Ritalin ER 10 mg 1 p.o. every morning. Continue Luvox 150 mg daily.  This sounds like a good dose for him and that it is helping with the OCD but not causing the sexual side effects as badly as in the past. Continue Lunesta 3 mg p.o. nightly as needed. Return in 4 weeks.  Melony Overlyeresa Dartanyan Deasis, PA-C   This record has been created using [employer] software.  Chart creation errors have been sought, but may not always have been located and corrected. Such creation errors do not reflect on the standard of medical care.

## 2018-10-23 ENCOUNTER — Telehealth: Payer: Self-pay | Admitting: Physician Assistant

## 2018-10-23 NOTE — Telephone Encounter (Signed)
Zaquan called if inquire about Ritilan immediate release.  You discussed starting this at the last appt.  He would like to do this.  Please call to discuss.  [phone]

## 2018-10-23 NOTE — Telephone Encounter (Signed)
We'll discuss at next appt.  I wanted to see how he did on current Ritalin first.

## 2018-10-26 NOTE — Telephone Encounter (Signed)
Left detailed information from provider. Instructed to call back with any problems or questions.

## 2018-11-04 ENCOUNTER — Other Ambulatory Visit: Payer: Self-pay

## 2018-11-04 ENCOUNTER — Encounter: Payer: Self-pay | Admitting: Physician Assistant

## 2018-11-04 ENCOUNTER — Ambulatory Visit (INDEPENDENT_AMBULATORY_CARE_PROVIDER_SITE_OTHER): Payer: 59 | Admitting: Physician Assistant

## 2018-11-04 DIAGNOSIS — F422 Mixed obsessional thoughts and acts: Secondary | ICD-10-CM

## 2018-11-04 DIAGNOSIS — F902 Attention-deficit hyperactivity disorder, combined type: Secondary | ICD-10-CM

## 2018-11-04 DIAGNOSIS — G4700 Insomnia, unspecified: Secondary | ICD-10-CM

## 2018-11-04 MED ORDER — METHYLPHENIDATE HCL 10 MG PO TABS: 10.0000 mg | ORAL_TABLET | Freq: Every day | ORAL | 0 refills | Status: DC

## 2018-11-04 NOTE — Progress Notes (Signed)
[facility]  Patient ID: Albert PolingJohn R [name],  MRN: [id number]  PCP: Veryl Speakalone, Gregory D, FNP  Date of Evaluation: 11/04/2018 Time spent:15 minutes  Chief Complaint:  Chief Complaint    Follow-up     Virtual Visit via Telephone Note  I connected with patient by a video enabled telemedicine application or telephone, with their informed consent, and verified patient privacy and that I am speaking with the correct person using two identifiers.  I am private, in my home and the patient is home.  I discussed the limitations, risks, security and privacy concerns of performing an evaluation and management service by telephone and the availability of in person appointments. I also discussed with the patient that there may be a patient responsible charge related to this service. The patient expressed understanding and agreed to proceed.   I discussed the assessment and treatment plan with the patient. The patient was provided an opportunity to ask questions and all were answered. The patient agreed with the plan and demonstrated an understanding of the instructions.   The patient was advised to call back or seek an in-person evaluation if the symptoms worsen or if the condition fails to improve as anticipated.  I provided 15 minutes of non-face-to-face time during this encounter.  HISTORY/CURRENT STATUS: HPI for 1 month med check.  At the last visit, we changed the Adderall to Ritalin LA because he had heard it works better in people who have OCD.  He feels that it has worked well except for the fact it lasts too long.  He becomes a little more anxious in the late afternoon and has trouble falling asleep sometimes.  Focus and concentration are pretty good.  He wonders if changing to instant release Ritalin would be a good idea or just go back to the Adderall.  OCD symptoms are pretty well controlled.  He has gotten used to the Luvox and is no longer having sexual side effects.  Even during  the coronavirus pandemic right now, he is not having worsening of OCD symptoms.  Denies dizziness, syncope, seizures, numbness, tingling, tremor, tics, unsteady gait, slurred speech, confusion. Denies muscle or joint pain, stiffness, or dystonia.  Individual Medical History/ Review of Systems: Changes? :No    Past medications for mental health diagnoses include: Luvox, Lunesta, Xanax, Wellbutrin, Risperdal, Prozac, Ambien, Deplin, Abilify, naltrexone, Adderall  Allergies: Sulfamethoxazole and Sulfonamide derivatives  Current Medications:  Current Outpatient Medications:  .  clomiPHENE (CLOMID) 50 MG tablet, Take 25 mg by mouth daily. , Disp: , Rfl:  .  Eszopiclone (ESZOPICLONE) 3 MG TABS, Take 3 mg by mouth at bedtime. Take immediately before bedtime, Disp: , Rfl:  .  Eszopiclone 3 MG TABS, Take 1 tablet (3 mg total) by mouth at bedtime as needed. Take immediately before bedtime, Disp: 30 tablet, Rfl: 5 .  fluvoxaMINE (LUVOX) 100 MG tablet, Take 1.5 tablets (150 mg total) by mouth at bedtime., Disp: 135 tablet, Rfl: 0 .  L-Methylfolate-Algae (DEPLIN 15 PO), Take 15 mg by mouth at bedtime. , Disp: , Rfl:  .  L-Methylfolate-Algae (DEPLIN 15) 15-90.314 MG CAPS, TAKE 1 CAPSULE BY MOUTH EVERY DAY, Disp: 90 capsule, Rfl: 2 .  telmisartan-hydrochlorothiazide (MICARDIS HCT) 40-12.5 MG tablet, Take 1 tablet by mouth daily., Disp: 90 tablet, Rfl: 3 .  methylphenidate (RITALIN) 10 MG tablet, Take 1 tablet (10 mg total) by mouth daily., Disp: 30 tablet, Rfl: 0 Medication Side Effects: none  Family Medical/ Social History: Changes? No  MENTAL HEALTH EXAM:  There were no vitals taken for this visit.There is no height or weight on file to calculate BMI.  General Appearance: unable to assess  Eye Contact:  unable to assess  Speech:  Clear and Coherent  Volume:  Normal  Mood:  Euthymic  Affect:  unable to assess  Thought Process:  Goal Directed  Orientation:  Full (Time, Place, and Person)   Thought Content: Logical   Suicidal Thoughts:  No  Homicidal Thoughts:  No  Memory:  WNL  Judgement:  Good  Insight:  Good  Psychomotor Activity:  unable to assess  Concentration:  Concentration: Good and Attention Span: Good  Recall:  Good  Fund of Knowledge: Good  Language: Good  Assets:  Desire for Improvement  ADL's:  Intact  Cognition: WNL  Prognosis:  Good    DIAGNOSES:    ICD-10-CM   1. Attention deficit hyperactivity disorder (ADHD), combined type F90.2   2. Mixed obsessional thoughts and acts F42.2   3. Insomnia, unspecified type G47.00     Receiving Psychotherapy: No    RECOMMENDATIONS:  DC Ritalin LA. Start Ritalin 10 mg 1 p.o. every morning.  If needed, he can try half to 1 also in the afternoon.  I realize he will run out early if that is the case and I can send in more if needed. Continue Luvox 150 mg nightly. Continue Lunesta 3 mg nightly as needed. Continue Deplin 15 mg daily. Return in 4 weeks.  [name], PA-C   This record has been created using [employer].  Chart creation errors have been sought, but may not always have been located and corrected. Such creation errors do not reflect on the standard of medical care.

## 2018-12-07 ENCOUNTER — Telehealth: Payer: Self-pay | Admitting: Physician Assistant

## 2018-12-07 NOTE — Telephone Encounter (Signed)
Pt is on Ritalin.  He feels like he needs to go back to Adderall. Please send in Adderall for him. To [facility] [address]

## 2018-12-08 ENCOUNTER — Other Ambulatory Visit: Payer: Self-pay | Admitting: Physician Assistant

## 2018-12-08 MED ORDER — AMPHETAMINE-DEXTROAMPHETAMINE 10 MG PO TABS: 10.0000 mg | ORAL_TABLET | Freq: Every day | ORAL | 0 refills | Status: DC

## 2018-12-08 NOTE — Telephone Encounter (Signed)
1 Rx sent in for Adderall 10 mg.

## 2018-12-23 ENCOUNTER — Ambulatory Visit (INDEPENDENT_AMBULATORY_CARE_PROVIDER_SITE_OTHER): Payer: 59 | Admitting: Orthopedic Surgery

## 2018-12-23 ENCOUNTER — Encounter: Payer: Self-pay | Admitting: Orthopedic Surgery

## 2018-12-23 ENCOUNTER — Other Ambulatory Visit: Payer: Self-pay

## 2018-12-23 DIAGNOSIS — M19011 Primary osteoarthritis, right shoulder: Secondary | ICD-10-CM

## 2018-12-24 ENCOUNTER — Encounter: Payer: Self-pay | Admitting: Orthopedic Surgery

## 2018-12-24 NOTE — Progress Notes (Signed)
Office Visit Note   Patient: Albert [name]           Date of Birth: September 04, 1975           MRN: [id number] Visit Date: 12/23/2018 Requested by: Albert [name], [address],  [place] [address] PCP: Albert Circle, FNP  Subjective: Chief Complaint  Patient presents with  . Right Shoulder - Pain    HPI: Albert [name] is a patient with right shoulder pain.  Localizes pain to the [facility] joint.  Has had previous AC joint injections x2.  Last injection helped him for a week or so but the pain recurred.  In general he states that the shoulder just does not feel quite right.  Drinking less alcohol is helped the overall inflammation in all of his joints.  He does report continued mechanical symptoms and grinding in that [name] joint.  He has not been lifting weights or doing push-ups but his shoulder still feels coarse in that region.  Symptoms have been ongoing for more than a year.  Has problems sleeping on that side.              ROS: All systems reviewed are negative as they relate to the chief complaint within the history of present illness.  Patient denies  fevers or chills.   Assessment & Plan: Visit Diagnoses:  1. Arthritis of right acromioclavicular joint     Plan: Impression is failure to conservative management for right shoulder MRI proven AC joint inflammation and arthritis.  Plan is right shoulder arthroscopy with distal clavicle excision.  Risks and benefits are discussed include but limited to incomplete pain relief shoulder stiffness as well as potential infection.  Patient understands risk and benefits.  All questions answered.  Follow-Up Instructions: No follow-ups on file.   Orders:  No orders of the defined types were placed in this encounter.  No orders of the defined types were placed in this encounter.     Procedures: No procedures performed   Clinical Data: No additional findings.  Objective: Vital Signs: There were no vitals taken for this  visit.  Physical Exam:   Constitutional: Patient appears well-developed HEENT:  Head: Normocephalic Eyes:EOM are normal Neck: Normal range of motion Cardiovascular: Normal rate Pulmonary/chest: Effort normal Neurologic: Patient is alert Skin: Skin is warm Psychiatric: Patient has normal mood and affect    Ortho Exam: Ortho exam demonstrates good cervical spine range of motion.  Patient has full active and passive range of motion of the right shoulder but does have a prominent AC joint on the right which is tender compared to the left.  Mild pain with crossarm adduction less than prior to the injection in earlier this year.  Rotator cuff strength is good on the right-hand side.  No restriction of passive range of motion on the right  Specialty Comments:  No specialty comments available.  Imaging: No results found.   PMFS History: Patient Active Problem List   Diagnosis Date Noted  . Essential hypertension 08/04/2018  . OCD (obsessive compulsive disorder) 05/25/2018  . Insomnia 05/25/2018  . Cardiac risk counseling 07/20/2017  . Closed nondisplaced fracture of neck of fifth metacarpal bone of right hand 07/10/2016  . Attention deficit hyperactivity disorder (ADHD) 06/19/2016  . Thrombosed external hemorrhoid 10/06/2015  . CONTACT DERMATITIS&OTHER ECZEMA DUE UNSPEC CAUSE 10/06/2009  . NECK PAIN, ACUTE 09/13/2009  . TOBACCO USE, QUIT 03/16/2009  . DEPRESSION 02/20/2009   Past Medical History:  Diagnosis Date  . Attention deficit hyperactivity disorder (ADHD) 06/19/2016  . Closed nondisplaced fracture of neck of fifth metacarpal bone of right hand 07/10/2016  . CONTACT DERMATITIS&OTHER ECZEMA DUE UNSPEC CAUSE 10/06/2009   Qualifier: Diagnosis of  By: Yetta BarreJones MD, Bernadene Bellhomas L.   . Depression   . DEPRESSION 02/20/2009   Qualifier: Diagnosis of  By: Yetta BarreJones MD, Bernadene Bellhomas L.   . Hyperlipidemia   . NECK PAIN, ACUTE 09/13/2009   Qualifier: Diagnosis of  By: Yetta BarreJones MD, Bernadene Bellhomas L.   . Thrombosed  external hemorrhoid 10/06/2015   Symptoms and exam consistent with thrombosed external hemorrhoid. Patient declines incise and drain at this time. Treat conservatively with hydrocortisone rectal cream and Tylenol 3 as needed for discomfort. Recommend sitz bath. Follow-up if symptoms worsen or do not improve or would like to seek interventional procedure.   . TOBACCO USE, QUIT 03/16/2009   Qualifier: Diagnosis of  By: Tora PerchesGrace, Vanessa      Family History  Problem Relation Age of Onset  . Healthy Mother   . Hyperlipidemia Paternal Grandfather   . Heart disease Paternal Grandfather   . Heart failure Paternal Grandfather     Past Surgical History:  Procedure Laterality Date  . HERNIA REPAIR     Social History   Occupational History  . Occupation: Sales  Tobacco Use  . Smoking status: Never Smoker  . Smokeless tobacco: Never Used  . Tobacco comment: socially smoked years ago.  Substance and Sexual Activity  . Alcohol use: Not Currently    Alcohol/week: 0.0 standard drinks  . Drug use: No  . Sexual activity: Yes

## 2019-01-11 ENCOUNTER — Other Ambulatory Visit: Payer: Self-pay

## 2019-01-12 ENCOUNTER — Ambulatory Visit: Payer: 59 | Admitting: Physician Assistant

## 2019-01-30 ENCOUNTER — Other Ambulatory Visit: Payer: Self-pay | Admitting: Physician Assistant

## 2019-02-08 ENCOUNTER — Other Ambulatory Visit: Payer: Self-pay | Admitting: Surgical

## 2019-02-08 ENCOUNTER — Encounter: Payer: Self-pay | Admitting: Orthopedic Surgery

## 2019-02-08 DIAGNOSIS — M19011 Primary osteoarthritis, right shoulder: Secondary | ICD-10-CM | POA: Diagnosis not present

## 2019-02-08 MED ORDER — METHOCARBAMOL 500 MG PO TABS: 500.0000 mg | ORAL_TABLET | Freq: Three times a day (TID) | ORAL | 0 refills | Status: DC | PRN

## 2019-02-08 MED ORDER — OXYCODONE HCL 5 MG PO TABS: 5.0000 mg | ORAL_TABLET | Freq: Four times a day (QID) | ORAL | 0 refills | Status: DC | PRN

## 2019-02-17 ENCOUNTER — Ambulatory Visit (INDEPENDENT_AMBULATORY_CARE_PROVIDER_SITE_OTHER): Payer: 59 | Admitting: Orthopedic Surgery

## 2019-02-17 ENCOUNTER — Other Ambulatory Visit: Payer: Self-pay

## 2019-02-17 ENCOUNTER — Encounter: Payer: Self-pay | Admitting: Orthopedic Surgery

## 2019-02-17 DIAGNOSIS — M19011 Primary osteoarthritis, right shoulder: Secondary | ICD-10-CM

## 2019-02-17 NOTE — Progress Notes (Signed)
   Post-Op Visit Note   Patient: Albert [name]           Date of Birth: 02-08-1976           MRN: [id number] Visit Date: 02/17/2019 PCP: Albert Circle, FNP   Assessment & Plan:  Chief Complaint:  Chief Complaint  Patient presents with  . Right Shoulder - Follow-up   Visit Diagnoses:  1. Arthritis of right acromioclavicular joint     Plan: Albert [name] is now about a week out right shoulder arthroscopy with distal clavicle excision arthroscopically.  On exam patient has good passive range of motion.  Lateral portal has a little bit of pressure ulceration but no infection.  He is doing home exercises at home for her range of motion.  Plan is to continue home exercise program for range of motion follow-up in 4 weeks likely get him start doing some weightlifting then.  Follow-Up Instructions: Return in about 4 weeks (around 03/17/2019).   Orders:  No orders of the defined types were placed in this encounter.  No orders of the defined types were placed in this encounter.   Imaging: No results found.  PMFS History: Patient Active Problem List   Diagnosis Date Noted  . Essential hypertension 08/04/2018  . OCD (obsessive compulsive disorder) 05/25/2018  . Insomnia 05/25/2018  . Cardiac risk counseling 07/20/2017  . Closed nondisplaced fracture of neck of fifth metacarpal bone of right hand 07/10/2016  . Attention deficit hyperactivity disorder (ADHD) 06/19/2016  . Thrombosed external hemorrhoid 10/06/2015  . CONTACT DERMATITIS&OTHER ECZEMA DUE UNSPEC CAUSE 10/06/2009  . NECK PAIN, ACUTE 09/13/2009  . TOBACCO USE, QUIT 03/16/2009  . DEPRESSION 02/20/2009   Past Medical History:  Diagnosis Date  . Attention deficit hyperactivity disorder (ADHD) 06/19/2016  . Closed nondisplaced fracture of neck of fifth metacarpal bone of right hand 07/10/2016  . CONTACT DERMATITIS&OTHER ECZEMA DUE Albert [name] CAUSE 10/06/2009   Qualifier: Diagnosis of  By: Albert Ramp MD, Albert [name] Right.   . Depression   .  DEPRESSION 02/20/2009   Qualifier: Diagnosis of  By: Albert Ramp MD, Albert [name] Right.   . Hyperlipidemia   . NECK PAIN, ACUTE 09/13/2009   Qualifier: Diagnosis of  By: Albert Ramp MD, Albert [name] Right.   . Thrombosed external hemorrhoid 10/06/2015   Symptoms and exam consistent with thrombosed external hemorrhoid. Patient declines incise and drain at this time. Treat conservatively with hydrocortisone rectal cream and Albert [name] as needed for discomfort. Recommend sitz bath. Follow-up if symptoms worsen or do not improve or would like to seek interventional procedure.   . TOBACCO USE, QUIT 03/16/2009   Qualifier: Diagnosis of  By: Albert [name]      Family History  Problem Relation Age of Onset  . Healthy Mother   . Hyperlipidemia Paternal Grandfather   . Heart disease Paternal Grandfather   . Heart failure Paternal Grandfather     Past Surgical History:  Procedure Laterality Date  . HERNIA REPAIR     Social History   Occupational History  . Occupation: Sales  Tobacco Use  . Smoking status: Never Smoker  . Smokeless tobacco: Never Used  . Tobacco comment: socially smoked years ago.  Substance and Sexual Activity  . Alcohol use: Not Currently    Alcohol/week: 0.0 standard drinks  . Drug use: No  . Sexual activity: Yes

## 2019-03-05 ENCOUNTER — Other Ambulatory Visit: Payer: Self-pay

## 2019-03-05 ENCOUNTER — Encounter: Payer: Self-pay | Admitting: Physician Assistant

## 2019-03-05 ENCOUNTER — Ambulatory Visit (INDEPENDENT_AMBULATORY_CARE_PROVIDER_SITE_OTHER): Payer: 59 | Admitting: Physician Assistant

## 2019-03-05 DIAGNOSIS — F411 Generalized anxiety disorder: Secondary | ICD-10-CM

## 2019-03-05 DIAGNOSIS — G4700 Insomnia, unspecified: Secondary | ICD-10-CM

## 2019-03-05 DIAGNOSIS — F902 Attention-deficit hyperactivity disorder, combined type: Secondary | ICD-10-CM

## 2019-03-05 DIAGNOSIS — F422 Mixed obsessional thoughts and acts: Secondary | ICD-10-CM | POA: Diagnosis not present

## 2019-03-05 DIAGNOSIS — F331 Major depressive disorder, recurrent, moderate: Secondary | ICD-10-CM | POA: Diagnosis not present

## 2019-03-05 MED ORDER — BUPROPION HCL ER (XL) 150 MG PO TB24: 150.0000 mg | ORAL_TABLET | Freq: Every day | ORAL | 1 refills | Status: DC

## 2019-03-05 MED ORDER — ESZOPICLONE 2 MG PO TABS: 2.0000 mg | ORAL_TABLET | Freq: Every evening | ORAL | 1 refills | Status: DC | PRN

## 2019-03-05 NOTE — Progress Notes (Signed)
[facility]  Patient ID: Albert [name],  MRN: [id number]  PCP: Golden Circle, FNP  Date of Evaluation: 03/05/2019 Time spent:25 minutes  Chief Complaint:  Chief Complaint    Medication Refill      HISTORY/CURRENT STATUS: HPI for routine med check.  Patient states he is doing well.  He feels that the medicines are working fine.  He stopped taking the stimulant as it seemed to make him a little more agitated and irritable.  He had some leftover Wellbutrin and restarted that several weeks ago.  He feels that that has given him a little more pep and is also making him focus better.  He is not really sure yet if it is helping mood but his mood is fine right now.  He is able to enjoy things.  Energy and motivation are good.  He sleeps well but has to take the Lunesta in order to do so.  He denies any suicidal or homicidal thoughts.  The OCD is pretty well controlled right now.  He is up to Luvox 200 mg.  Due to sexual side effects, he would like to decrease, at least to see if that would help with those side effects.  He understands to increase back up if the obsessive thoughts recur and are intolerable.  He would like to be tested for MTHFR if at all possible.  He has been on Deplin for quite a while but just ran out last week.  He is not sure how helpful it is been and he would like to know if he should continue it or not.  Denies headache blurred vision, cough or cold symptoms, chest pain, palpitations, or shortness of breath.  No abdominal pain, nausea or vomiting, constipation or diarrhea.  Denies dizziness, syncope, seizures, numbness, tingling, tremor, tics, unsteady gait, slurred speech, confusion. Denies muscle or joint pain, stiffness, or dystonia.  Individual Medical History/ Review of Systems: Changes? :No    Past medications for mental health diagnoses include: Luvox, Lunesta, Xanax, Wellbutrin, Risperdal, Prozac, Ambien, Deplin, Abilify, naltrexone,  Adderall  Allergies: Sulfamethoxazole and Sulfonamide derivatives  Current Medications:  Current Outpatient Medications:  .  buPROPion (WELLBUTRIN XL) 150 MG 24 hr tablet, Take 1 tablet (150 mg total) by mouth daily., Disp: 30 tablet, Rfl: 1 .  clomiPHENE (CLOMID) 50 MG tablet, Take 25 mg by mouth daily. , Disp: , Rfl:  .  fluvoxaMINE (LUVOX) 100 MG tablet, TAKE 1&1/2 TABLETS BY MOUTH AT BEDTIME (Patient taking differently: 200 mg. ), Disp: 135 tablet, Rfl: 0 .  telmisartan-hydrochlorothiazide (MICARDIS HCT) 40-12.5 MG tablet, Take 1 tablet by mouth daily., Disp: 90 tablet, Rfl: 3 .  eszopiclone (LUNESTA) 2 MG TABS tablet, Take 1 tablet (2 mg total) by mouth at bedtime as needed for sleep. Take immediately before bedtime, Disp: 30 tablet, Rfl: 1 .  L-Methylfolate-Algae (DEPLIN 15 PO), Take 15 mg by mouth at bedtime. , Disp: , Rfl:  .  methocarbamol (ROBAXIN) 500 MG tablet, Take 1 tablet (500 mg total) by mouth every 8 (eight) hours as needed for muscle spasms. (Patient not taking: Reported on 03/05/2019), Disp: 30 tablet, Rfl: 0 .  oxyCODONE (ROXICODONE) 5 MG immediate release tablet, Take 1 tablet (5 mg total) by mouth every 6 (six) hours as needed. (Patient not taking: Reported on 03/05/2019), Disp: 35 tablet, Rfl: 0 Medication Side Effects: none  Family Medical/ Social History: Changes? No  MENTAL HEALTH EXAM:  There were no vitals taken for this visit.There is no height  or weight on file to calculate BMI.  General Appearance: Casual, Neat and Well Groomed  Eye Contact:  Good  Speech:  Clear and Coherent  Volume:  Normal  Mood:  Euthymic  Affect:  Appropriate  Thought Process:  Goal Directed  Orientation:  Full (Time, Place, and Person)  Thought Content: Logical   Suicidal Thoughts:  No  Homicidal Thoughts:  No  Memory:  WNL  Judgement:  Good  Insight:  Good  Psychomotor Activity:  Normal  Concentration:  Concentration: Good  Recall:  Good  Fund of Knowledge: Good  Language:  Good  Assets:  Desire for Improvement  ADL's:  Intact  Cognition: WNL  Prognosis:  Good    DIAGNOSES:    ICD-10-CM   1. Mixed obsessional thoughts and acts  F42.2   2. Major depressive disorder, recurrent episode, moderate (HCC)  F33.1   3. Attention deficit hyperactivity disorder (ADHD), combined type  F90.2   4. Generalized anxiety disorder  F41.1   5. Insomnia, unspecified type  G47.00     Receiving Psychotherapy: No    RECOMMENDATIONS:  Decrease Luvox to 150 mg daily. Decrease Lunesta to 2 mg nightly as needed.  (He would like to wean off if possible so we will go to the lower dose for now.  We can increase if it does not work.) Continue Wellbutrin XL 150 mg daily.  I have ordered dispense as written at his request but if it is too expensive, he will get the generic. Continue Deplin 15 mg daily. Gene site testing was done. Return in 3 months.  [name], PA-C

## 2019-03-08 ENCOUNTER — Ambulatory Visit (INDEPENDENT_AMBULATORY_CARE_PROVIDER_SITE_OTHER): Payer: 59 | Admitting: Physician Assistant

## 2019-03-08 ENCOUNTER — Encounter: Payer: Self-pay | Admitting: Physician Assistant

## 2019-03-08 DIAGNOSIS — M25511 Pain in right shoulder: Secondary | ICD-10-CM

## 2019-03-08 NOTE — Progress Notes (Signed)
HPI: [name] is a patient of Albert [name] comes into the neck to the right shoulder swelling.  He underwent a right shoulder arthroscopy with distal clavicle excision arthroscopically in late August.  He had no pain in the shoulder.  He is doing a home exercise program.  His main complaint is swelling over the anterior lateral aspect shoulder.  Is worried about infection.  Denies any fevers chills.  Physical exam: Right shoulder full range of motion without pain.  Port sites all healing well without signs of infection.  Slight swelling over the right AC joint but no tenderness.  No evidence of infection.  Impression: Status post right shoulder arthroscopy with arthroscopic excision of distal clavicle.  Plan: Reassurance is given.  Follow-up with Dr. Marlou [name] is scheduled in the next 3 weeks.  Continue to do his home exercise program.

## 2019-03-31 ENCOUNTER — Other Ambulatory Visit: Payer: Self-pay | Admitting: Physician Assistant

## 2019-03-31 MED ORDER — VIIBRYD 20 MG PO TABS: 20.0000 mg | ORAL_TABLET | Freq: Every day | ORAL | 1 refills | Status: DC

## 2019-04-01 ENCOUNTER — Telehealth: Payer: Self-pay | Admitting: Physician Assistant

## 2019-04-01 NOTE — Telephone Encounter (Signed)
Albert [name] called to report that he picked up his samples of Viibryd.  He needs to know how to come off the Luvox as he starts the Viibryd.  Please call with instructions.

## 2019-04-02 NOTE — Telephone Encounter (Signed)
I left him a VM to return my call.

## 2019-04-02 NOTE — Telephone Encounter (Signed)
Take Luvox 100 mg qd for a week and then stop.  Start the Viibryd at the same time. (will overlap for a week.)

## 2019-04-05 NOTE — Telephone Encounter (Signed)
Spoke with pt. In office today. He will taper down. He understands directions. He said no side effects so.

## 2019-04-09 ENCOUNTER — Other Ambulatory Visit: Payer: Self-pay

## 2019-04-09 ENCOUNTER — Ambulatory Visit: Payer: 59 | Admitting: Medical

## 2019-04-09 ENCOUNTER — Encounter: Payer: Self-pay | Admitting: Medical

## 2019-04-09 VITALS — BP 125/82 | HR 76 | Temp 97.0°F | Resp 16 | Ht 72.0 in | Wt 195.6 lb

## 2019-04-09 DIAGNOSIS — F429 Obsessive-compulsive disorder, unspecified: Secondary | ICD-10-CM | POA: Diagnosis not present

## 2019-04-09 DIAGNOSIS — G4700 Insomnia, unspecified: Secondary | ICD-10-CM

## 2019-04-09 DIAGNOSIS — Z0000 Encounter for general adult medical examination without abnormal findings: Secondary | ICD-10-CM | POA: Diagnosis not present

## 2019-04-09 DIAGNOSIS — I10 Essential (primary) hypertension: Secondary | ICD-10-CM | POA: Diagnosis not present

## 2019-04-09 MED ORDER — AMOXICILLIN-POT CLAVULANATE 875-125 MG PO TABS: 1.0000 | ORAL_TABLET | Freq: Two times a day (BID) | ORAL | 0 refills | Status: DC

## 2019-04-09 NOTE — Progress Notes (Signed)
Subjective:    Patient ID: Albert [name], male    DOB: [birth year], 43 y.o.   MRN: [id number]  HPI  Pt in for first time but has seen him when brings daughter in.  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  Pt expresses wants wellness exam. Stopped exercising since march after pandemic started. Diet has been moderate healthy. He stopped drinking alcohol 6 months ago. Non smoker. Married- one child.  Pt bp is controlled. He sees Dr. Dulce SellarMunley. On micardis.  Pt sees psychiatrist PA Melony Overlyeresa Hurst. Pt states mood is well controlled. Pt is on viibryd. Dx with ocd.  For insomnia on lunesta.  For low testosterone pt is on clomid. Urologist prescribed that.  Pt declines flu vaccine today.   Review of Systems  Constitutional: Negative for chills and fatigue.  HENT: Negative for congestion, ear pain and sore throat.        Sinus pressure in the past on and off. He feels ear presures when he chews. Symptoms for 2 weeks.   Respiratory: Negative for cough, chest tightness, shortness of breath and wheezing.   Cardiovascular: Negative for chest pain and palpitations.  Gastrointestinal: Negative for abdominal pain, constipation, nausea and rectal pain.  Genitourinary: Negative for flank pain, frequency and urgency.  Musculoskeletal: Negative for back pain.  Skin: Negative for rash.  Neurological: Negative for dizziness, tremors, weakness and light-headedness.  Hematological: Negative for adenopathy.  Psychiatric/Behavioral: Negative for behavioral problems and confusion.    Past Medical History:  Diagnosis Date  . Attention deficit hyperactivity disorder (ADHD) 06/19/2016  . Closed nondisplaced fracture of neck of fifth metacarpal bone of right hand 07/10/2016  . CONTACT DERMATITIS&OTHER ECZEMA DUE UNSPEC CAUSE 10/06/2009   Qualifier: Diagnosis of  By: Yetta BarreJones MD, Bernadene Bellhomas L.   . Depression   . DEPRESSION 02/20/2009   Qualifier: Diagnosis of  By: Yetta BarreJones MD, Bernadene Bellhomas L.   . Hyperlipidemia   . NECK PAIN, ACUTE 09/13/2009    Qualifier: Diagnosis of  By: Yetta BarreJones MD, Bernadene Bellhomas L.   . Thrombosed external hemorrhoid 10/06/2015   Symptoms and exam consistent with thrombosed external hemorrhoid. Patient declines incise and drain at this time. Treat conservatively with hydrocortisone rectal cream and Tylenol 3 as needed for discomfort. Recommend sitz bath. Follow-up if symptoms worsen or do not improve or would like to seek interventional procedure.   . TOBACCO USE, QUIT 03/16/2009   Qualifier: Diagnosis of  By: Tora PerchesGrace, Vanessa       Social History   Socioeconomic History  . Marital status: Married    Spouse name: Not on file  . Number of children: 1  . Years of education: 4416  . Highest education level: Not on file  Occupational History  . Occupation: Airline pilotales  Social Needs  . Financial resource strain: Not on file  . Food insecurity    Worry: Not on file    Inability: Not on file  . Transportation needs    Medical: Not on file    Non-medical: Not on file  Tobacco Use  . Smoking status: Never Smoker  . Smokeless tobacco: Never Used  . Tobacco comment: socially smoked years ago.  Substance and Sexual Activity  . Alcohol use: Not Currently    Alcohol/week: 0.0 standard drinks  . Drug use: No  . Sexual activity: Yes  Lifestyle  . Physical activity    Days per week: Not on file    Minutes per session: Not on file  . Stress: Not on file  Relationships  .  Social Musician on phone: Not on file    Gets together: Not on file    Attends religious service: Not on file    Active member of club or organization: Not on file    Attends meetings of clubs or organizations: Not on file    Relationship status: Not on file  . Intimate partner violence    Fear of current or ex partner: Not on file    Emotionally abused: Not on file    Physically abused: Not on file    Forced sexual activity: Not on file  Other Topics Concern  . Not on file  Social History Narrative   Fun: Exercise, play music, sports     Past Surgical History:  Procedure Laterality Date  . HERNIA REPAIR      Family History  Problem Relation Age of Onset  . Healthy Mother   . Hyperlipidemia Paternal Grandfather   . Heart disease Paternal Grandfather   . Heart failure Paternal Grandfather     Allergies  Allergen Reactions  . Sulfamethoxazole     Other reaction(s): Arthralgias (intolerance)  . Sulfonamide Derivatives     REACTION: Rash Stiff joints    Current Outpatient Medications on File Prior to Visit  Medication Sig Dispense Refill  . buPROPion (WELLBUTRIN XL) 150 MG 24 hr tablet Take 1 tablet (150 mg total) by mouth daily. 30 tablet 1  . clomiPHENE (CLOMID) 50 MG tablet Take 25 mg by mouth daily.     . eszopiclone (LUNESTA) 2 MG TABS tablet Take 1 tablet (2 mg total) by mouth at bedtime as needed for sleep. Take immediately before bedtime 30 tablet 1  . fluvoxaMINE (LUVOX) 100 MG tablet TAKE 1&1/2 TABLETS BY MOUTH AT BEDTIME (Patient taking differently: 200 mg. ) 135 tablet 0  . L-Methylfolate-Algae (DEPLIN 15 PO) Take 15 mg by mouth at bedtime.     . methocarbamol (ROBAXIN) 500 MG tablet Take 1 tablet (500 mg total) by mouth every 8 (eight) hours as needed for muscle spasms. 30 tablet 0  . oxyCODONE (ROXICODONE) 5 MG immediate release tablet Take 1 tablet (5 mg total) by mouth every 6 (six) hours as needed. 35 tablet 0  . telmisartan-hydrochlorothiazide (MICARDIS HCT) 40-12.5 MG tablet Take 1 tablet by mouth daily. 90 tablet 3  . Vilazodone HCl (VIIBRYD) 20 MG TABS Take 1 tablet (20 mg total) by mouth daily. 30 tablet 1   No current facility-administered medications on file prior to visit.     BP 125/82   Pulse 76   Temp (!) 97 F (36.1 C) (Temporal)   Resp 16   Ht 6' (1.829 m)   Wt 195 lb 9.6 oz (88.7 kg)   SpO2 99%   BMI 26.53 kg/m       Objective:   Physical Exam  General Mental Status- Alert. General Appearance- Not in acute distress.   Skin General: Color- Normal Color. Moisture-  Normal Moisture.  Neck Carotid Arteries- Normal color. Moisture- Normal Moisture. No carotid bruits. No JVD.  Chest and Lung Exam Auscultation: Breath Sounds:-Normal.  Cardiovascular Auscultation:Rythm- Regular. Murmurs & Other Heart Sounds:Auscultation of the heart reveals- No Murmurs.  Abdomen Inspection:-Inspeection Normal. Palpation/Percussion:Note:No mass. Palpation and Percussion of the abdomen reveal- Non Tender, Non Distended + BS, no rebound or guarding.    Neurologic Cranial Nerve exam:- CN III-XII intact(No nystagmus), symmetric smile. Strength:- 5/5 equal and symmetric strength both upper and lower extremities.    HEENT Head- Normal. Ear Auditory  Canal - Left- scant wax but not significant. Right - Normal.Tympanic Membrane- Left- pinkish red. Right- Normal. Eye Sclera/Conjunctiva- Left- Normal. Right- Normal. Nose & Sinuses Nasal Mucosa- Left-  Boggy and Congested. Right-  Boggy and  Congested.Bilateral maxillary and frontal sinus pressure.            Assessment & Plan:  For you wellness exam today I have ordered cbc, cmp and lipid panel future. Please get fasting next week. Schedule on way out.  Flu vaccine declined.  Recommend exercise and healthy diet.  We will let you know lab results as they come in.  Follow up date appointment will be determined after lab review.   OCD, insomnia, htn and low T conditions controlled. Continue current medications. Also ear pressure and sinus pressure complaint.  Concern for left OM/infection with eustachian tube pressure. Will rx augmentin anitbiotic and flonase otc.  [id number] charge as discussed prior history on first visit and did CPE/wellness.  [name], PA-C

## 2019-04-09 NOTE — Patient Instructions (Addendum)
For you wellness exam today I have ordered cbc, cmp and lipid panel future. Please get fasting next week. Schedule on way out.  Flu vaccine declined.  Recommend exercise and healthy diet.  We will let you know lab results as they come in.  Follow up date appointment will be determined after lab review.   OCD, insomnia, htn and low T conditions controlled. Continue current medications.  Concern for left OM/infection with eustachian tube pressure. Will rx augmentin anitbiotic and unse flonase otc.   Preventive Care 43-75 Years Old, Male Preventive care refers to lifestyle choices and visits with your health care provider that can promote health and wellness. This includes:  A yearly physical exam. This is also called an annual well check.  Regular dental and eye exams.  Immunizations.  Screening for certain conditions.  Healthy lifestyle choices, such as eating a healthy diet, getting regular exercise, not using drugs or products that contain nicotine and tobacco, and limiting alcohol use. What can I expect for my preventive care visit? Physical exam Your health care provider will check:  Height and weight. These may be used to calculate body mass index (BMI), which is a measurement that tells if you are at a healthy weight.  Heart rate and blood pressure.  Your skin for abnormal spots. Counseling Your health care provider may ask you questions about:  Alcohol, tobacco, and drug use.  Emotional well-being.  Home and relationship well-being.  Sexual activity.  Eating habits.  Work and work Statistician. What immunizations do I need?  Influenza (flu) vaccine  This is recommended every year. Tetanus, diphtheria, and pertussis (Tdap) vaccine  You may need a Td booster every 10 years. Varicella (chickenpox) vaccine  You may need this vaccine if you have not already been vaccinated. Zoster (shingles) vaccine  You may need this after age 55. Measles, mumps, and  rubella (MMR) vaccine  You may need at least one dose of MMR if you were born in 1957 or later. You may also need a second dose. Pneumococcal conjugate (PCV13) vaccine  You may need this if you have certain conditions and were not previously vaccinated. Pneumococcal polysaccharide (PPSV23) vaccine  You may need one or two doses if you smoke cigarettes or if you have certain conditions. Meningococcal conjugate (MenACWY) vaccine  You may need this if you have certain conditions. Hepatitis A vaccine  You may need this if you have certain conditions or if you travel or work in places where you may be exposed to hepatitis A. Hepatitis B vaccine  You may need this if you have certain conditions or if you travel or work in places where you may be exposed to hepatitis B. Haemophilus influenzae type b (Hib) vaccine  You may need this if you have certain risk factors. Human papillomavirus (HPV) vaccine  If recommended by your health care provider, you may need three doses over 6 months. You may receive vaccines as individual doses or as more than one vaccine together in one shot (combination vaccines). Talk with your health care provider about the risks and benefits of combination vaccines. What tests do I need? Blood tests  Lipid and cholesterol levels. These may be checked every 5 years, or more frequently if you are over 51 years old.  Hepatitis C test.  Hepatitis B test. Screening  Lung cancer screening. You may have this screening every year starting at age 52 if you have a 30-pack-year history of smoking and currently smoke or have quit within  the past 15 years.  Prostate cancer screening. Recommendations will vary depending on your family history and other risks.  Colorectal cancer screening. All adults should have this screening starting at age 86 and continuing until age 14. Your health care provider may recommend screening at age 40 if you are at increased risk. You will have  tests every 1-10 years, depending on your results and the type of screening test.  Diabetes screening. This is done by checking your blood sugar (glucose) after you have not eaten for a while (fasting). You may have this done every 1-3 years.  Sexually transmitted disease (STD) testing. Follow these instructions at home: Eating and drinking  Eat a diet that includes fresh fruits and vegetables, whole grains, lean protein, and low-fat dairy products.  Take vitamin and mineral supplements as recommended by your health care provider.  Do not drink alcohol if your health care provider tells you not to drink.  If you drink alcohol: ? Limit how much you have to 0-2 drinks a day. ? Be aware of how much alcohol is in your drink. In the [place] one drink equals one 12 oz bottle of beer (355 mL), one 5 oz glass of wine (148 mL), or one 1 oz glass of hard liquor (44 mL). Lifestyle  Take daily care of your teeth and gums.  Stay active. Exercise for at least 30 minutes on 5 or more days each week.  Do not use any products that contain nicotine or tobacco, such as cigarettes, e-cigarettes, and chewing tobacco. If you need help quitting, ask your health care provider.  If you are sexually active, practice safe sex. Use a condom or other form of protection to prevent STIs (sexually transmitted infections).  Talk with your health care provider about taking a low-dose aspirin every day starting at age 76. What's next?  Go to your health care provider once a year for a well check visit.  Ask your health care provider how often you should have your eyes and teeth checked.  Stay up to date on all vaccines. This information is not intended to replace advice given to you by your health care provider. Make sure you discuss any questions you have with your health care provider. Document Released: 06/23/2015 Document Revised: 05/21/2018 Document Reviewed: 05/21/2018 Elsevier Patient Education  2020  [employer].

## 2019-04-12 ENCOUNTER — Other Ambulatory Visit: Payer: Self-pay

## 2019-04-12 ENCOUNTER — Other Ambulatory Visit (INDEPENDENT_AMBULATORY_CARE_PROVIDER_SITE_OTHER): Payer: 59

## 2019-04-12 DIAGNOSIS — Z0000 Encounter for general adult medical examination without abnormal findings: Secondary | ICD-10-CM

## 2019-04-12 LAB — COMPREHENSIVE METABOLIC PANEL
ALT: 18 U/L (ref 0–53)
AST: 17 U/L (ref 0–37)
Albumin: 4.4 g/dL (ref 3.5–5.2)
Alkaline Phosphatase: 46 U/L (ref 39–117)
BUN: 16 mg/dL (ref 6–23)
CO2: 32 mEq/L (ref 19–32)
Calcium: 9.3 mg/dL (ref 8.4–10.5)
Chloride: 102 mEq/L (ref 96–112)
Creatinine, Ser: 1.13 mg/dL (ref 0.40–1.50)
GFR: 70.86 mL/min (ref >60.00)
Glucose, Bld: 90 mg/dL (ref 70–99)
Potassium: 3.9 mEq/L (ref 3.5–5.1)
Sodium: 140 mEq/L (ref 135–145)
Total Bilirubin: 0.5 mg/dL (ref 0.2–1.2)
Total Protein: 6.8 g/dL (ref 6.0–8.3)

## 2019-04-12 LAB — CBC WITH DIFFERENTIAL/PLATELET
Basophils Absolute: 0 10*3/uL (ref 0.0–0.1)
Basophils Relative: 0.6 % (ref 0.0–3.0)
Eosinophils Absolute: 0.1 10*3/uL (ref 0.0–0.7)
Eosinophils Relative: 2.2 % (ref 0.0–5.0)
HCT: 43.5 % (ref 39.0–52.0)
Hemoglobin: 14.8 g/dL (ref 13.0–17.0)
Lymphocytes Relative: 39.3 % (ref 12.0–46.0)
Lymphs Abs: 2.2 10*3/uL (ref 0.7–4.0)
MCHC: 34.1 g/dL (ref 30.0–36.0)
MCV: 88.1 fl (ref 78.0–100.0)
Monocytes Absolute: 0.4 10*3/uL (ref 0.1–1.0)
Monocytes Relative: 7.4 % (ref 3.0–12.0)
Neutro Abs: 2.8 10*3/uL (ref 1.4–7.7)
Neutrophils Relative %: 50.5 % (ref 43.0–77.0)
Platelets: 188 10*3/uL (ref 150.0–400.0)
RBC: 4.94 Mil/uL (ref 4.22–5.81)
RDW: 12.9 % (ref 11.5–15.5)
WBC: 5.5 10*3/uL (ref 4.0–10.5)

## 2019-04-12 LAB — LIPID PANEL
Cholesterol: 192 mg/dL (ref 0–200)
HDL: 36.1 mg/dL — ABNORMAL LOW (ref >39.00)
LDL Cholesterol: 126 mg/dL — ABNORMAL HIGH (ref 0–99)
NonHDL: 155.52
Total CHOL/HDL Ratio: 5
Triglycerides: 148 mg/dL (ref 0.0–149.0)
VLDL: 29.6 mg/dL (ref 0.0–40.0)

## 2019-04-15 ENCOUNTER — Ambulatory Visit (INDEPENDENT_AMBULATORY_CARE_PROVIDER_SITE_OTHER): Payer: 59 | Admitting: Physician Assistant

## 2019-04-15 ENCOUNTER — Encounter: Payer: Self-pay | Admitting: Physician Assistant

## 2019-04-15 ENCOUNTER — Other Ambulatory Visit: Payer: Self-pay

## 2019-04-15 VITALS — BP 135/90 | HR 80

## 2019-04-15 DIAGNOSIS — G4700 Insomnia, unspecified: Secondary | ICD-10-CM

## 2019-04-15 DIAGNOSIS — F331 Major depressive disorder, recurrent, moderate: Secondary | ICD-10-CM

## 2019-04-15 DIAGNOSIS — F411 Generalized anxiety disorder: Secondary | ICD-10-CM | POA: Diagnosis not present

## 2019-04-15 DIAGNOSIS — F422 Mixed obsessional thoughts and acts: Secondary | ICD-10-CM

## 2019-04-15 DIAGNOSIS — F902 Attention-deficit hyperactivity disorder, combined type: Secondary | ICD-10-CM

## 2019-04-15 DIAGNOSIS — F518 Other sleep disorders not due to a substance or known physiological condition: Secondary | ICD-10-CM | POA: Diagnosis not present

## 2019-04-15 MED ORDER — ESZOPICLONE 3 MG PO TABS: 3.0000 mg | ORAL_TABLET | Freq: Every day | ORAL | 1 refills | Status: DC

## 2019-04-15 MED ORDER — PRAZOSIN HCL 1 MG PO CAPS: ORAL_CAPSULE | ORAL | 1 refills | Status: DC

## 2019-04-15 NOTE — Progress Notes (Signed)
[facility]  Patient ID: Albert [name],  MRN: [id number]  PCP: [name], PA-C  Date of Evaluation: 04/15/2019 Time spent:15 minutes  Chief Complaint:  Chief Complaint    Follow-up      HISTORY/CURRENT STATUS: HPI for f/u after starting Viibryd.  See Gene Sight test results. We changed Luvox to Viibryd after reviewing the results.  It's been a little over 2 weeks since starting the Viibryd.  He just finished weaning off of the Luvox last week.  He states he already feels some better.  He feels a little more pep but less anxious.  So far the OCD symptoms have not recurred.  The biggest problem is a lot of very vivid dreams.  They are not nightmares but since starting the Viibryd he has dreams every night and he does not feel rested when he wakes up.  Sometimes he will wake up right in the middle of a dream it seems, and he is uncertain if he is in a dream state or reality.  He has to stop and focus, and get his bearings before he can get out of bed.  He has trouble falling asleep though.  That is still a problem.    Patient denies loss of interest in usual activities and is able to enjoy things.  Denies decreased energy or motivation.  Appetite has not changed.  No extreme sadness, tearfulness, or feelings of hopelessness.  Denies any changes in concentration, making decisions or remembering things. No longer having decreased libido since being off the Luvox.  Denies suicidal or homicidal thoughts.  After we receive the results of the gene site test and found that MTHFR showed normal folic acid conversion, he stopped the Deplin and has noticed no difference at all.  Denies dizziness, syncope, seizures, numbness, tingling, tremor, tics, unsteady gait, slurred speech, confusion. Denies muscle or joint pain, stiffness, or dystonia.  Individual Medical History/ Review of Systems: Changes? :No    Past medications for mental health diagnoses include: Luvox, Lunesta, Xanax,  Wellbutrin, Risperdal, Prozac, Ambien, Deplin, Abilify, naltrexone, Adderall, Zoloft when in high school  Allergies: Sulfamethoxazole and Sulfonamide derivatives  Current Medications:  Current Outpatient Medications:  .  amoxicillin-clavulanate (AUGMENTIN) 875-125 MG tablet, Take 1 tablet by mouth 2 (two) times daily., Disp: 20 tablet, Rfl: 0 .  clomiPHENE (CLOMID) 50 MG tablet, Take 25 mg by mouth daily. , Disp: , Rfl:  .  telmisartan-hydrochlorothiazide (MICARDIS HCT) 40-12.5 MG tablet, Take 1 tablet by mouth daily., Disp: 90 tablet, Rfl: 3 .  Vilazodone HCl (VIIBRYD) 20 MG TABS, Take 1 tablet (20 mg total) by mouth daily., Disp: 30 tablet, Rfl: 1 .  Eszopiclone 3 MG TABS, Take 1 tablet (3 mg total) by mouth at bedtime. Take immediately before bedtime, Disp: 30 tablet, Rfl: 1 .  methocarbamol (ROBAXIN) 500 MG tablet, Take 1 tablet (500 mg total) by mouth every 8 (eight) hours as needed for muscle spasms. (Patient not taking: Reported on 04/15/2019), Disp: 30 tablet, Rfl: 0 .  oxyCODONE (ROXICODONE) 5 MG immediate release tablet, Take 1 tablet (5 mg total) by mouth every 6 (six) hours as needed. (Patient not taking: Reported on 04/15/2019), Disp: 35 tablet, Rfl: 0 .  prazosin (MINIPRESS) 1 MG capsule, 1 po qhs for 5 days, and then increase to 2 po qhs for 5 days, then increase to 3 po qhs IF dreams are still occurring., Disp: 90 capsule, Rfl: 1 Medication Side Effects: none  Family Medical/ Social History: Changes? No  MENTAL HEALTH EXAM:  Blood pressure 135/90, pulse 80.There is no height or weight on file to calculate BMI.  General Appearance: Casual, Neat and Well Groomed  Eye Contact:  Good  Speech:  Clear and Coherent  Volume:  Normal  Mood:  Euthymic  Affect:  Appropriate  Thought Process:  Goal Directed and Descriptions of Associations: Intact  Orientation:  Full (Time, Place, and Person)  Thought Content: Logical   Suicidal Thoughts:  No  Homicidal Thoughts:  No  Memory:  WNL   Judgement:  Good  Insight:  Good  Psychomotor Activity:  Normal  Concentration:  Concentration: Good  Recall:  Good  Fund of Knowledge: Good  Language: Good  Assets:  Desire for Improvement  ADL's:  Intact  Cognition: WNL  Prognosis:  Good  MTHFR was nl on Gene Sight test  DIAGNOSES:    ICD-10-CM   1. Abnormal dreams  F51.8   2. Mixed obsessional thoughts and acts  F42.2   3. Major depressive disorder, recurrent episode, moderate (HCC)  F33.1   4. Generalized anxiety disorder  F41.1   5. Attention deficit hyperactivity disorder (ADHD), combined type  F90.2   6. Insomnia, unspecified type  G47.00     Receiving Psychotherapy: No    RECOMMENDATIONS:  I am glad to see him doing so well! He has already discontinued the Deplin and Luvox.   Continue Viibryd 20 mg p.o. daily. Increase Lunesta back to 3 milligrams nightly as needed. Start prazosin 1 mg nightly for 5 nights then if dreams are no better, increase to 2 p.o. nightly for 5 nights, if dreams are still no better can increase to 3 p.o. nightly.  He will stay at that dose until her next visit.  We discussed the benefits, risks and side effects and he accepts.  He particularly needs to watch for dizziness, lightheadedness or syncope with this drug.  Let me know if he has those side effects. Return in 4 to 6 weeks.  [name], PA-C

## 2019-04-16 ENCOUNTER — Other Ambulatory Visit: Payer: Self-pay

## 2019-04-16 DIAGNOSIS — Z20822 Contact with and (suspected) exposure to covid-19: Secondary | ICD-10-CM

## 2019-04-17 LAB — NOVEL CORONAVIRUS, NAA: SARS-CoV-2, NAA: NOT DETECTED

## 2019-04-26 ENCOUNTER — Other Ambulatory Visit: Payer: Self-pay | Admitting: Physician Assistant

## 2019-04-29 NOTE — Progress Notes (Signed)
Thank you very much 

## 2019-05-14 ENCOUNTER — Encounter: Payer: Self-pay | Admitting: Physician Assistant

## 2019-05-14 ENCOUNTER — Ambulatory Visit (INDEPENDENT_AMBULATORY_CARE_PROVIDER_SITE_OTHER): Payer: 59 | Admitting: Physician Assistant

## 2019-05-14 ENCOUNTER — Other Ambulatory Visit: Payer: Self-pay

## 2019-05-14 DIAGNOSIS — F331 Major depressive disorder, recurrent, moderate: Secondary | ICD-10-CM

## 2019-05-14 DIAGNOSIS — F411 Generalized anxiety disorder: Secondary | ICD-10-CM

## 2019-05-14 DIAGNOSIS — F422 Mixed obsessional thoughts and acts: Secondary | ICD-10-CM | POA: Diagnosis not present

## 2019-05-14 MED ORDER — LAMOTRIGINE 25 MG PO TABS: 25.0000 mg | ORAL_TABLET | Freq: Every day | ORAL | 1 refills | Status: DC

## 2019-05-14 MED ORDER — FLUOXETINE HCL 40 MG PO CAPS: 40.0000 mg | ORAL_CAPSULE | Freq: Every day | ORAL | 1 refills | Status: DC

## 2019-05-14 NOTE — Progress Notes (Signed)
[facility]  Patient ID: Albert [name],  MRN: [id number]  PCP: [name], PA-C  Date of Evaluation: 05/14/2019 Time spent:15 minutes  Chief Complaint:  Chief Complaint    Medication Problem      HISTORY/CURRENT STATUS: HPI Med SE are an issue.  Since being on the [place], the SE have been bad, GI issues and vivid dreams.  Prazosin did not seem to be helpful but he only took it a few nights.  He did not like the way that made him feel either.  And the OCD is worse too.  "I don't want to stay on it."  Per gene site testing, he would respond fairly well at usual doses of Prozac.  He would like to retry that or the Luvox which he took in the past for the OCD.  Luvox caused sexual side effects pretty bad so he would like to avoid that if possible.  He only took Prozac as a bridge to get off of the Luvox so were not really sure how that would help with the OCD.  He is also read about Lamictal and wants to consider that as a possibility.  OCD issues have recurred on the Viibryd.  Mostly ruminating thoughts but some compulsions as well.  Checking locks and things like that.  He does have some mild underlying depression but states over years looking back, he has seen some pattern in that, but it is difficult to describe.  He is never had mania.  He wonders whether he has bipolar disorder or not.  Right now he is doing pretty well.  Energy and motivation are good.  He is able to enjoy things.  Appetite is normal.  Sleep is much better.  He was on vacation over the past week and forgot to take the [place] with him.  He has stopped it cold [place] and is now getting back on a normal sleep schedule.  He does not want to restart it, as he feels that he can sleep well on his own right now.  Denies suicidal or homicidal thoughts.  Patient denies increased energy with decreased need for sleep, no increased talkativeness, no racing thoughts, no impulsivity or risky behaviors, no increased  spending, no increased libido, no grandiosity.  Denies dizziness, syncope, seizures, numbness, tingling, tremor, tics, unsteady gait, slurred speech, confusion. Denies muscle or joint pain, stiffness, or dystonia.  Individual Medical History/ Review of Systems: Changes? :No    Past medications for mental health diagnoses include: Luvox caused sexual SE, Lunesta, Xanax, Wellbutrin, Risperdal, Prozac, Ambien, Deplin, Abilify, naltrexone, Adderall, Zoloft when in high school, Viibryd caused GI SE and vivid dreams  Allergies: Sulfamethoxazole and Sulfonamide derivatives  Current Medications:  Current Outpatient Medications:  .  clomiPHENE (CLOMID) 50 MG tablet, Take 25 mg by mouth daily. , Disp: , Rfl:  .  telmisartan-hydrochlorothiazide (MICARDIS HCT) 40-12.5 MG tablet, Take 1 tablet by mouth daily., Disp: 90 tablet, Rfl: 3 .  amoxicillin-clavulanate (AUGMENTIN) 875-125 MG tablet, Take 1 tablet by mouth 2 (two) times daily. (Patient not taking: Reported on 05/14/2019), Disp: 20 tablet, Rfl: 0 .  FLUoxetine (PROZAC) 40 MG capsule, Take 1 capsule (40 mg total) by mouth daily., Disp: 30 capsule, Rfl: 1 .  lamoTRIgine (LAMICTAL) 25 MG tablet, Take 1 tablet (25 mg total) by mouth daily., Disp: 30 tablet, Rfl: 1 .  methocarbamol (ROBAXIN) 500 MG tablet, Take 1 tablet (500 mg total) by mouth every 8 (eight) hours as needed for muscle spasms. (Patient  not taking: Reported on 04/15/2019), Disp: 30 tablet, Rfl: 0 .  oxyCODONE (ROXICODONE) 5 MG immediate release tablet, Take 1 tablet (5 mg total) by mouth every 6 (six) hours as needed. (Patient not taking: Reported on 04/15/2019), Disp: 35 tablet, Rfl: 0 Medication Side Effects: none  Family Medical/ Social History: Changes? No  MENTAL HEALTH EXAM:  There were no vitals taken for this visit.There is no height or weight on file to calculate BMI.  General Appearance: Casual, Neat and Well Groomed  Eye Contact:  Good  Speech:  Clear and Coherent  Volume:   Normal  Mood:  Euthymic  Affect:  Appropriate  Thought Process:  Goal Directed and Descriptions of Associations: Intact  Orientation:  Full (Time, Place, and Person)  Thought Content: Logical   Suicidal Thoughts:  No  Homicidal Thoughts:  No  Memory:  WNL  Judgement:  Good  Insight:  Good  Psychomotor Activity:  Normal  Concentration:  Concentration: Good  Recall:  Good  Fund of Knowledge: Good  Language: Good  Assets:  Desire for Improvement  ADL's:  Intact  Cognition: WNL  Prognosis:  Good    DIAGNOSES:    ICD-10-CM   1. Mixed obsessional thoughts and acts  F42.2   2. Major depressive disorder, recurrent episode, moderate (HCC)  F33.1   3. Generalized anxiety disorder  F41.1     Receiving Psychotherapy: No    RECOMMENDATIONS:  Continue Viibryd 10 mg for another 3 to 4 days and then stop. Start Prozac 40 mg every morning. Start Lamictal 25 mg 1 daily and 1 to 2 weeks. Counseled patient regarding potential benefits, risks, and side effects of Lamictal to include potential risk of Stevens-Johnson syndrome. Advised patient to stop taking Lamictal and contact office immediately if rash develops and to seek urgent medical attention if rash is severe and/or spreading quickly.  Patient understands and accepts these risks.  He understands that he may or may not need both the Prozac and the Lamictal but it is okay to take them together and if he does indeed have bipolar disorder, which not completely sure of, the Lamictal will help the bipolar depression more than the Prozac would. Lamictal is not great for OCD so I think he needs both drugs. Return in 4 to 6 weeks.   Return in 4 to 6 weeks.  [name], PA-C

## 2019-05-17 ENCOUNTER — Other Ambulatory Visit: Payer: Self-pay

## 2019-05-17 DIAGNOSIS — Z20822 Contact with and (suspected) exposure to covid-19: Secondary | ICD-10-CM

## 2019-05-19 LAB — NOVEL CORONAVIRUS, NAA: SARS-CoV-2, NAA: NOT DETECTED

## 2019-05-28 ENCOUNTER — Other Ambulatory Visit: Payer: Self-pay | Admitting: Physician Assistant

## 2019-06-02 ENCOUNTER — Ambulatory Visit: Payer: 59 | Admitting: Physician Assistant

## 2019-06-08 ENCOUNTER — Other Ambulatory Visit: Payer: Self-pay | Admitting: Physician Assistant

## 2019-06-14 ENCOUNTER — Telehealth: Payer: Self-pay | Admitting: Physician Assistant

## 2019-06-14 ENCOUNTER — Other Ambulatory Visit: Payer: Self-pay | Admitting: Physician Assistant

## 2019-06-14 NOTE — Telephone Encounter (Signed)
Pt called to request refill for Luvox 100 mg 2/d. He never stopped taking Luvox to start Viibryd.  Pharmacy [facility] on file. Next appt 1/13

## 2019-06-14 NOTE — Telephone Encounter (Signed)
Please call and confirm his meds.  I reviewed my note from 05/14/2019 and it appears that we stopped the Viibryd, he was already off the Luvox, and we started Prozac and Lamictal.  Of course he should not be on the Luvox and Prozac at the same time.  Please see exactly what he is taking and then can refill it.  Thanks.  And confirm the pharmacy.  [facility] [address].

## 2019-06-14 NOTE — Telephone Encounter (Signed)
Left detailed voicemail about medications and to call back with what he is currently taking.

## 2019-06-15 ENCOUNTER — Other Ambulatory Visit: Payer: Self-pay

## 2019-06-15 NOTE — Telephone Encounter (Signed)
Albert [name], can you try to reach patient about what his medications are? I left him a message yesterday but he's not called back.

## 2019-06-15 NOTE — Telephone Encounter (Signed)
Pt returning call stated he was on Prozac but decided to go back on the Luvox. He stated he felt more comfortable on it. He would like the Luvox refilled.

## 2019-06-16 NOTE — Telephone Encounter (Signed)
Patient called back and went back on luvox, asking for that to be filled

## 2019-06-17 NOTE — Telephone Encounter (Signed)
Refill for fluvoxamine submitted

## 2019-06-23 ENCOUNTER — Ambulatory Visit: Payer: 59 | Admitting: Physician Assistant

## 2019-07-02 ENCOUNTER — Encounter: Payer: Self-pay | Admitting: Physician Assistant

## 2019-07-02 ENCOUNTER — Other Ambulatory Visit: Payer: Self-pay

## 2019-07-02 ENCOUNTER — Ambulatory Visit (INDEPENDENT_AMBULATORY_CARE_PROVIDER_SITE_OTHER): Payer: 59 | Admitting: Physician Assistant

## 2019-07-02 DIAGNOSIS — G4700 Insomnia, unspecified: Secondary | ICD-10-CM | POA: Diagnosis not present

## 2019-07-02 DIAGNOSIS — F338 Other recurrent depressive disorders: Secondary | ICD-10-CM

## 2019-07-02 DIAGNOSIS — F429 Obsessive-compulsive disorder, unspecified: Secondary | ICD-10-CM | POA: Diagnosis not present

## 2019-07-02 MED ORDER — BUPROPION HCL ER (SR) 150 MG PO TB12: 150.0000 mg | ORAL_TABLET | Freq: Two times a day (BID) | ORAL | 1 refills | Status: DC

## 2019-07-02 MED ORDER — NALTREXONE HCL 50 MG PO TABS: 50.0000 mg | ORAL_TABLET | Freq: Every day | ORAL | 0 refills | Status: DC

## 2019-07-02 NOTE — Progress Notes (Signed)
[facility]  Patient ID: Albert [name],  MRN: [id number]  PCP: Esperanza Richters, PA-C  Date of Evaluation: 07/02/2019 Time spent:20 minutes  Chief Complaint:  Chief Complaint    Follow-up      HISTORY/CURRENT STATUS: HPI for routine med check.  Since the last visit, we switched back to the Luvox.  He is now on 50 mg and states he is doing pretty well.  The OCD is manageable.  He prefers not to increase the dose because of side effects that he has previously had on higher doses.  States he has seasonal affective disorder and wintertime is usually hard for him.  He has some old Wellbutrin SR at home and he started taking 100 mg twice daily approximately 3 weeks ago.  He states it is helped a little bit but he still has low energy and motivation.  He would like to increase the dose if appropriate.  He is able to enjoy things.  Denies suicidal or homicidal thoughts.  He sleeps good most of the time.  Anxiety is not much of a problem right now.  Work is going well.  The only problem socially is that he has started drinking just a little bit again.  He went about 6 months without any alcohol at all now he is drinking 1 to 2 glasses of wine at night.  He is wondering if getting back on naltrexone is a good idea.  He had been taking it as needed which was effective in the past.  He still has some naltrexone.  Patient denies increased energy with decreased need for sleep, no increased talkativeness, no racing thoughts, no impulsivity or risky behaviors, no increased spending, no increased libido, no grandiosity.  Denies dizziness, syncope, seizures, numbness, tingling, tremor, tics, unsteady gait, slurred speech, confusion. Denies muscle or joint pain, stiffness, or dystonia.  Individual Medical History/ Review of Systems: Changes? :No    Past medications for mental health diagnoses include: Luvox caused sexual SE, Lunesta, Xanax, Wellbutrin, Risperdal, Prozac, Ambien, Deplin,  Abilify, naltrexone, Adderall, Zoloft when in high school, Viibryd caused GI SE and vivid dreams  Allergies: Sulfamethoxazole and Sulfonamide derivatives   Current Medications:  Current Outpatient Medications:  .  clomiPHENE (CLOMID) 50 MG tablet, Take 25 mg by mouth daily. , Disp: , Rfl:  .  fluvoxaMINE (LUVOX) 100 MG tablet, TAKE 1&1/2 TABLETS BY MOUTH AT BEDTIME (Patient taking differently: 50 mg. ), Disp: 135 tablet, Rfl: 0 .  telmisartan-hydrochlorothiazide (MICARDIS HCT) 40-12.5 MG tablet, Take 1 tablet by mouth daily., Disp: 90 tablet, Rfl: 3 .  amoxicillin-clavulanate (AUGMENTIN) 875-125 MG tablet, Take 1 tablet by mouth 2 (two) times daily. (Patient not taking: Reported on 05/14/2019), Disp: 20 tablet, Rfl: 0 .  buPROPion (WELLBUTRIN SR) 150 MG 12 hr tablet, Take 1 tablet (150 mg total) by mouth 2 (two) times daily. Please dispense Sandoz brand if available, per patient request., Disp: 60 tablet, Rfl: 1 .  methocarbamol (ROBAXIN) 500 MG tablet, Take 1 tablet (500 mg total) by mouth every 8 (eight) hours as needed for muscle spasms. (Patient not taking: Reported on 04/15/2019), Disp: 30 tablet, Rfl: 0 .  oxyCODONE (ROXICODONE) 5 MG immediate release tablet, Take 1 tablet (5 mg total) by mouth every 6 (six) hours as needed. (Patient not taking: Reported on 04/15/2019), Disp: 35 tablet, Rfl: 0 Medication Side Effects: none  Family Medical/ Social History: Changes? No  MENTAL HEALTH EXAM:  There were no vitals taken for this visit.There is no height  or weight on file to calculate BMI.  General Appearance: Casual, Neat and Well Groomed  Eye Contact:  Good  Speech:  Clear and Coherent  Volume:  Normal  Mood:  Euthymic  Affect:  Appropriate  Thought Process:  Goal Directed and Descriptions of Associations: Intact  Orientation:  Full (Time, Place, and Person)  Thought Content: Logical   Suicidal Thoughts:  No  Homicidal Thoughts:  No  Memory:  WNL  Judgement:  Good  Insight:  Good   Psychomotor Activity:  Normal  Concentration:  Concentration: Good  Recall:  Good  Fund of Knowledge: Good  Language: Good  Assets:  Desire for Improvement  ADL's:  Intact  Cognition: WNL  Prognosis:  Good    DIAGNOSES:    ICD-10-CM   1. Obsessive-compulsive disorder, unspecified type  F42.9   2. Seasonal affective disorder ([place])  F33.8   3. Insomnia, unspecified type  G47.00     Receiving Psychotherapy: No    RECOMMENDATIONS:  Continue Luvox 50 mg p.o. nightly. Increase Wellbutrin SR to 150 mg p.o. twice daily. Restart naltrexone 50 mg 1 p.o. daily (he was taking as needed before drinking) Return in 4 weeks.   [name], PA-C

## 2019-07-08 ENCOUNTER — Encounter: Payer: Self-pay | Admitting: Orthopedic Surgery

## 2019-07-08 ENCOUNTER — Other Ambulatory Visit: Payer: Self-pay

## 2019-07-08 ENCOUNTER — Ambulatory Visit: Payer: Self-pay

## 2019-07-08 ENCOUNTER — Ambulatory Visit: Payer: 59 | Admitting: Orthopedic Surgery

## 2019-07-08 DIAGNOSIS — M4807 Spinal stenosis, lumbosacral region: Secondary | ICD-10-CM

## 2019-07-08 DIAGNOSIS — M5442 Lumbago with sciatica, left side: Secondary | ICD-10-CM

## 2019-07-08 DIAGNOSIS — G8929 Other chronic pain: Secondary | ICD-10-CM | POA: Diagnosis not present

## 2019-07-08 MED ORDER — PREDNISONE 10 MG (21) PO TBPK: ORAL_TABLET | ORAL | 0 refills | Status: DC

## 2019-07-08 MED ORDER — TRAMADOL HCL 50 MG PO TABS: 50.0000 mg | ORAL_TABLET | Freq: Four times a day (QID) | ORAL | 0 refills | Status: DC | PRN

## 2019-07-08 NOTE — Progress Notes (Signed)
Office Visit Note   Patient: Albert [name]           Date of Birth: 1975/10/01           MRN: [id number] Visit Date: 07/08/2019 Requested by: Esperanza Richters, PA-C [address] [place],  [place] [address] PCP: Marisue Brooklyn  Subjective: Chief Complaint  Patient presents with  . Lower Back - Pain    HPI: Albert [name] is a 44 y.o. male who presents to the office complaining of low back pain.  Patient notes low back pain over the last 3 months.  He denies any injury leading to the pain.  He notes radiation from the left lower back into the left buttocks.  He cannot sit more than 5 minutes without increase in pain.  He denies any numbness/tingling or weakness.  He has been taking Aleve with little relief and tramadol with some relief.  Over the last 3 months it has not gotten any worse or better.  It bothers him every day.  He has tried 8 weeks of a home exercise program with no significant relief.  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  He has no history of back surgery.                ROS:  All systems reviewed are negative as they relate to the chief complaint within the history of present illness.  Patient denies fevers or chills.  Assessment & Plan: Visit Diagnoses:  1. Chronic left-sided low back pain with left-sided sciatica   2. Spinal stenosis of lumbosacral region     Plan: Patient is a 44 year old male complaining of back pain with radiation to the left buttock.  This is been ongoing for 3 months with no relief from home exercise program.  X-rays are negative for any pathology responsible for his pain but he does have loss of lordosis of the lumbar spine.  He has no weakness on exam but with fairly normal x-rays and no relenting of symptoms, we will order MRI of the lumbar spine for further evaluation.  Also prescribed steroid Dosepak and tramadol for pain relief.  Patient will follow-up after MRI to  review results.  Follow-Up Instructions: No follow-ups on file.   Orders:  Orders Placed This Encounter  Procedures  . XR Lumbar Spine 2-3 Views  . MR Lumbar Spine w/o contrast   Meds ordered this encounter  Medications  . predniSONE (STERAPRED UNI-PAK 21 TAB) 10 MG (21) TBPK tablet    Sig: Take as directed    Dispense:  21 tablet    Refill:  0  . traMADol (ULTRAM) 50 MG tablet    Sig: Take 1 tablet (50 mg total) by mouth every 6 (six) hours as needed.    Dispense:  30 tablet    Refill:  0      Procedures: No procedures performed   Clinical Data: No additional findings.  Objective: Vital Signs: There were no vitals taken for this visit.  Physical Exam:  Constitutional: Patient appears well-developed HEENT:  Head: Normocephalic Eyes:EOM are normal Neck: Normal range of motion Cardiovascular: Normal rate Pulmonary/chest: Effort normal Neurologic: Patient is alert Skin: Skin is warm Psychiatric: Patient has normal mood and affect  Ortho Exam:  5/5 motor strength of the hip flexor, quadriceps, hamstring, hip abduction, hip adduction, dorsiflexion, plantarflexion.  No signs of hypo or hyperreflexia.  Sensation intact  through all dermatomes of the bilateral lower extremities.  Mild to moderate tenderness to palpation throughout the left paraspinal musculature and lumbar axial spine.  No tenderness to palpation throughout the right lumbar paraspinal musculature.  No pain with internal rotation/external rotation of bilateral hips.  Negative straight leg raise bilaterally.  Specialty Comments:  No specialty comments available.  Imaging: XR Lumbar Spine 2-3 Views  Result Date: 07/08/2019 AP lateral lumbar spine reviewed.  No acute fracture or dislocation.  Minimal degenerative disc disease.  Visualized hips appear intact.  Normal lumbar spine with only mild degenerative facet changes in the lower levels.    PMFS History: Patient Active Problem List   Diagnosis Date  Noted  . Essential hypertension 08/04/2018  . OCD (obsessive compulsive disorder) 05/25/2018  . Insomnia 05/25/2018  . Cardiac risk counseling 07/20/2017  . Closed nondisplaced fracture of neck of fifth metacarpal bone of right hand 07/10/2016  . Attention deficit hyperactivity disorder (ADHD) 06/19/2016  . Thrombosed external hemorrhoid 10/06/2015  . CONTACT DERMATITIS&OTHER ECZEMA DUE UNSPEC CAUSE 10/06/2009  . NECK PAIN, ACUTE 09/13/2009  . TOBACCO USE, QUIT 03/16/2009  . DEPRESSION 02/20/2009   Past Medical History:  Diagnosis Date  . Attention deficit hyperactivity disorder (ADHD) 06/19/2016  . Closed nondisplaced fracture of neck of fifth metacarpal bone of right hand 07/10/2016  . CONTACT DERMATITIS&OTHER ECZEMA DUE [place] CAUSE 10/06/2009   Qualifier: Diagnosis of  By: Ronnald Ramp MD, Arvid Right.   . Depression   . DEPRESSION 02/20/2009   Qualifier: Diagnosis of  By: Ronnald Ramp MD, Arvid Right.   . Hyperlipidemia   . NECK PAIN, ACUTE 09/13/2009   Qualifier: Diagnosis of  By: Ronnald Ramp MD, Arvid Right.   . Thrombosed external hemorrhoid 10/06/2015   Symptoms and exam consistent with thrombosed external hemorrhoid. Patient declines incise and drain at this time. Treat conservatively with hydrocortisone rectal cream and Tylenol 3 as needed for discomfort. Recommend sitz bath. Follow-up if symptoms worsen or do not improve or would like to seek interventional procedure.   . TOBACCO USE, QUIT 03/16/2009   Qualifier: Diagnosis of  By: Doralee Albino      Family History  Problem Relation Age of Onset  . Healthy Mother   . Hyperlipidemia Paternal Grandfather   . Heart disease Paternal Grandfather   . Heart failure Paternal Grandfather     Past Surgical History:  Procedure Laterality Date  . HERNIA REPAIR     Social History   Occupational History  . Occupation: Sales  Tobacco Use  . Smoking status: Never Smoker  . Smokeless tobacco: Never Used  . Tobacco comment: socially smoked years ago.    Substance and Sexual Activity  . Alcohol use: Yes    Alcohol/week: 7.0 - 10.0 standard drinks    Types: 7 - 10 Glasses of wine per week  . Drug use: No  . Sexual activity: Yes

## 2019-07-10 ENCOUNTER — Encounter: Payer: Self-pay | Admitting: Orthopedic Surgery

## 2019-07-30 ENCOUNTER — Ambulatory Visit: Payer: 59 | Admitting: Physician Assistant

## 2019-08-02 ENCOUNTER — Other Ambulatory Visit: Payer: Self-pay | Admitting: Physician Assistant

## 2019-08-04 NOTE — Telephone Encounter (Signed)
Taking luvox 50 mg

## 2019-08-05 ENCOUNTER — Ambulatory Visit
Admission: RE | Admit: 2019-08-05 | Discharge: 2019-08-05 | Disposition: A | Discharge status: Home / Self Care | Payer: 59 | Source: Ambulatory Visit | Attending: Orthopedic Surgery | Admitting: Orthopedic Surgery

## 2019-08-05 DIAGNOSIS — M4807 Spinal stenosis, lumbosacral region: Secondary | ICD-10-CM

## 2019-08-05 NOTE — Telephone Encounter (Signed)
Your last note says taking 50 mg?

## 2019-08-06 ENCOUNTER — Ambulatory Visit: Payer: 59 | Admitting: Orthopedic Surgery

## 2019-08-13 ENCOUNTER — Other Ambulatory Visit: Payer: Self-pay

## 2019-08-13 ENCOUNTER — Encounter: Payer: Self-pay | Admitting: Orthopedic Surgery

## 2019-08-13 ENCOUNTER — Ambulatory Visit: Payer: 59 | Admitting: Orthopedic Surgery

## 2019-08-13 DIAGNOSIS — M5126 Other intervertebral disc displacement, lumbar region: Secondary | ICD-10-CM

## 2019-08-13 MED ORDER — MELOXICAM 15 MG PO TABS: 15.0000 mg | ORAL_TABLET | Freq: Every day | ORAL | 1 refills | Status: DC | PRN

## 2019-08-13 MED ORDER — TRAMADOL HCL 50 MG PO TABS: 50.0000 mg | ORAL_TABLET | Freq: Every day | ORAL | 0 refills | Status: DC | PRN

## 2019-08-13 NOTE — Progress Notes (Signed)
Office Visit Note   Patient: Albert [name]           Date of Birth: Oct 22, 1975           MRN: [id number] Visit Date: 08/13/2019 Requested by: Esperanza Richters, PA-C [address] [place],  [place] [address] PCP: Marisue Brooklyn  Subjective: Chief Complaint  Patient presents with   Follow-up    HPI: Albert [name] is a 44 y.o. male who presents to the office complaining of low back pain.  He presents for follow-up for MRI of the lumbar spine.  Patient notes that the steroid Dosepak helped tremendously and brought his pain from occurring 90% of the time to about 10% of the time.  He still has occasional radiation into his left buttocks but he denies any weakness and notes that his pain is overall significantly decreased in intensity.  He is taking tramadol as needed.  He also takes occasional Advil.  He has not returned to a gym workout just yet..                ROS:  All systems reviewed are negative as they relate to the chief complaint within the history of present illness.  Patient denies fevers or chills.  Assessment & Plan: Visit Diagnoses:  1. Protrusion of lumbar intervertebral disc     Plan: Patient is a 44 year old male who presents for MRI follow-up.  His low back pain and radicular pain to the left buttock has improved significantly with the steroid Dosepak.  He is not having much in the way of symptoms now.  No nerve root tension signs on exam.  MRI of the lumbar spine revealed central disc protrusion at L5-S1 with bilateral L5 foraminal stenosis.  These images were discussed with the patient.  Discussed options available to patient.  We will hold off on ESI's for now.  If pain returns then consideration should be given to trying a  course of anti-inflammatories for about a week and if that does not resolve his pain, he will call the office for ESI's based on the MRI lumbar spine.  Prescribed tramadol No. 10 as well as Mobic 15 mg daily to take for the next 2  weeks and then only to take as needed.  Patient agrees with this plan.  Will follow up with the office as needed.  Follow-Up Instructions: No follow-ups on file.   Orders:  No orders of the defined types were placed in this encounter.  No orders of the defined types were placed in this encounter.     Procedures: No procedures performed   Clinical Data: No additional findings.  Objective: Vital Signs: There were no vitals taken for this visit.  Physical Exam:  Constitutional: Patient appears well-developed HEENT:  Head: Normocephalic Eyes:EOM are normal Neck: Normal range of motion Cardiovascular: Normal rate Pulmonary/chest: Effort normal Neurologic: Patient is alert Skin: Skin is warm Psychiatric: Patient has normal mood and affect  Ortho Exam:  No tenderness throughout the axial lumbar spine or paraspinal musculature.  5/5 motor strength of the bilateral hip flexors, quadriceps, hamstring, dorsiflexion, plantarflexion.  No loss of sensation throughout all to the dermatomes of the bilateral lower extremities.  Negative straight leg raise bilaterally.  Specialty Comments:  No specialty comments available.  Imaging: No results found.   PMFS History: Patient Active Problem List   Diagnosis Date Noted   Essential hypertension 08/04/2018   OCD (obsessive compulsive disorder) 05/25/2018   Insomnia  05/25/2018   Cardiac risk counseling 07/20/2017   Closed nondisplaced fracture of neck of fifth metacarpal bone of right hand 07/10/2016   Attention deficit hyperactivity disorder (ADHD) 06/19/2016   Thrombosed external hemorrhoid 10/06/2015   CONTACT DERMATITIS&OTHER ECZEMA DUE UNSPEC CAUSE 10/06/2009   NECK PAIN, ACUTE 09/13/2009   TOBACCO USE, QUIT 03/16/2009   DEPRESSION 02/20/2009   Past Medical History:  Diagnosis Date   Attention deficit hyperactivity disorder (ADHD) 06/19/2016   Closed nondisplaced fracture of neck of fifth metacarpal bone of right  hand 07/10/2016   CONTACT DERMATITIS&OTHER ECZEMA DUE UNSPEC CAUSE 10/06/2009   Qualifier: Diagnosis of  By: Ronnald Ramp MD, Arvid Right.    Depression    DEPRESSION 02/20/2009   Qualifier: Diagnosis of  By: Ronnald Ramp MD, Arvid Right.    Hyperlipidemia    NECK PAIN, ACUTE 09/13/2009   Qualifier: Diagnosis of  By: Ronnald Ramp MD, Arvid Right.    Thrombosed external hemorrhoid 10/06/2015   Symptoms and exam consistent with thrombosed external hemorrhoid. Patient declines incise and drain at this time. Treat conservatively with hydrocortisone rectal cream and Tylenol 3 as needed for discomfort. Recommend sitz bath. Follow-up if symptoms worsen or do not improve or would like to seek interventional procedure.    TOBACCO USE, QUIT 03/16/2009   Qualifier: Diagnosis of  By: Doralee Albino      Family History  Problem Relation Age of Onset   Healthy Mother    Hyperlipidemia Paternal Grandfather    Heart disease Paternal Grandfather    Heart failure Paternal Grandfather     Past Surgical History:  Procedure Laterality Date   HERNIA REPAIR     Social History   Occupational History   Occupation: Sales  Tobacco Use   Smoking status: Never Smoker   Smokeless tobacco: Never Used   Tobacco comment: socially smoked years ago.  Substance and Sexual Activity   Alcohol use: Yes    Alcohol/week: 7.0 - 10.0 standard drinks    Types: 7 - 10 Glasses of wine per week   Drug use: No   Sexual activity: Yes

## 2019-08-16 ENCOUNTER — Other Ambulatory Visit: Payer: Self-pay | Admitting: Cardiology

## 2019-08-16 ENCOUNTER — Encounter: Payer: Self-pay | Admitting: Orthopedic Surgery

## 2019-08-16 ENCOUNTER — Ambulatory Visit: Payer: 59 | Admitting: Physician Assistant

## 2019-08-25 ENCOUNTER — Encounter: Payer: Self-pay | Admitting: Physician Assistant

## 2019-08-25 ENCOUNTER — Ambulatory Visit (INDEPENDENT_AMBULATORY_CARE_PROVIDER_SITE_OTHER): Payer: 59 | Admitting: Physician Assistant

## 2019-08-25 ENCOUNTER — Other Ambulatory Visit: Payer: Self-pay

## 2019-08-25 VITALS — BP 151/90 | HR 73

## 2019-08-25 DIAGNOSIS — G4700 Insomnia, unspecified: Secondary | ICD-10-CM

## 2019-08-25 DIAGNOSIS — F411 Generalized anxiety disorder: Secondary | ICD-10-CM

## 2019-08-25 DIAGNOSIS — F422 Mixed obsessional thoughts and acts: Secondary | ICD-10-CM | POA: Diagnosis not present

## 2019-08-25 DIAGNOSIS — F902 Attention-deficit hyperactivity disorder, combined type: Secondary | ICD-10-CM

## 2019-08-25 MED ORDER — ESZOPICLONE 1 MG PO TABS: 1.0000 mg | ORAL_TABLET | Freq: Every evening | ORAL | 2 refills | Status: DC | PRN

## 2019-08-25 MED ORDER — AMPHETAMINE-DEXTROAMPHETAMINE 10 MG PO TABS: 10.0000 mg | ORAL_TABLET | Freq: Every day | ORAL | 0 refills | Status: DC

## 2019-08-25 NOTE — Progress Notes (Signed)
[facility]  Patient ID: Albert [name],  MRN: [id number]  PCP: [name], PA-C  Date of Evaluation: 08/25/2019 Time spent:20 minutes  Chief Complaint:  Chief Complaint    Follow-up      HISTORY/CURRENT STATUS: HPI For routine med check.  He d/c the Wellbutrin because he did not feel like he needed it anymore.  Once we started having more sunshine and warmer temperatures, he started feeling better.  It may have been causing his hair to thin out a little bit as well.  Mood has been fine and ruminating thoughts have not been nearly as bad.  Even though he is taking Luvox 50 mg, he still has fatigue the next day from it.  When he took the Adderall in the past, he did have more clarity and was able to be more productive, plus he had less fatigue during the day.  He is able to enjoy things.  Work is very busy.  He still has a hard time falling asleep at times.  He had some old Lunesta at home and has been taking the equivalent of 1 mg and it helps him fall asleep and stay asleep the whole night.  He is unsure if that is what is causing him to be more tired during the morning or if it is the Luvox or both.  No suicidal or homicidal thoughts.  Patient denies increased energy with decreased need for sleep, no increased talkativeness, no racing thoughts, no impulsivity or risky behaviors, no increased spending, no increased libido, no grandiosity.  Denies dizziness, syncope, seizures, numbness, tingling, tremor, tics, unsteady gait, slurred speech, confusion. Denies muscle or joint pain, stiffness, or dystonia.  Individual Medical History/ Review of Systems: Changes? :No    Past medications for mental health diagnoses include: Luvox, Lunesta, Xanax, Wellbutrin, Risperdal, Prozac, Ambien, Deplin, Abilify, naltrexone, Adderall  Allergies: Sulfamethoxazole and Sulfonamide derivatives  Current Medications:  Current Outpatient Medications:  .  clomiPHENE (CLOMID) 50 MG tablet,  Take 25 mg by mouth daily. , Disp: , Rfl:  .  fluvoxaMINE (LUVOX) 100 MG tablet, Take 0.5 tablets (50 mg total) by mouth at bedtime., Disp: 45 tablet, Rfl: 1 .  naltrexone (DEPADE) 50 MG tablet, Take 1 tablet (50 mg total) by mouth daily., Disp: 30 tablet, Rfl: 0 .  telmisartan-hydrochlorothiazide (MICARDIS HCT) 40-12.5 MG tablet, TAKE 1 TABLET BY MOUTH EVERY DAY, Disp: 30 tablet, Rfl: 0 .  traMADol (ULTRAM) 50 MG tablet, Take 1 tablet (50 mg total) by mouth daily as needed., Disp: 10 tablet, Rfl: 0 .  amoxicillin-clavulanate (AUGMENTIN) 875-125 MG tablet, Take 1 tablet by mouth 2 (two) times daily. (Patient not taking: Reported on 05/14/2019), Disp: 20 tablet, Rfl: 0 .  amphetamine-dextroamphetamine (ADDERALL) 10 MG tablet, Take 1 tablet (10 mg total) by mouth daily with breakfast., Disp: 30 tablet, Rfl: 0 .  eszopiclone (LUNESTA) 1 MG TABS tablet, Take 1 tablet (1 mg total) by mouth at bedtime as needed for sleep. Take immediately before bedtime, Disp: 30 tablet, Rfl: 2 .  meloxicam (MOBIC) 15 MG tablet, Take 1 tablet (15 mg total) by mouth daily as needed for pain. (Patient not taking: Reported on 08/25/2019), Disp: 30 tablet, Rfl: 1 .  methocarbamol (ROBAXIN) 500 MG tablet, Take 1 tablet (500 mg total) by mouth every 8 (eight) hours as needed for muscle spasms. (Patient not taking: Reported on 04/15/2019), Disp: 30 tablet, Rfl: 0 .  oxyCODONE (ROXICODONE) 5 MG immediate release tablet, Take 1 tablet (5 mg total) by  mouth every 6 (six) hours as needed. (Patient not taking: Reported on 04/15/2019), Disp: 35 tablet, Rfl: 0 .  predniSONE (STERAPRED UNI-PAK 21 TAB) 10 MG (21) TBPK tablet, Take as directed (Patient not taking: Reported on 08/25/2019), Disp: 21 tablet, Rfl: 0 Medication Side Effects: none  Family Medical/ Social History: Changes? No  MENTAL HEALTH EXAM:  Blood pressure (!) 151/90, pulse 73.There is no height or weight on file to calculate BMI.  General Appearance: Casual, Neat and Well  Groomed  Eye Contact:  Good  Speech:  Clear and Coherent  Volume:  Normal  Mood:  Euthymic  Affect:  Appropriate  Thought Process:  Goal Directed and Descriptions of Associations: Intact  Orientation:  Full (Time, Place, and Person)  Thought Content: Logical   Suicidal Thoughts:  No  Homicidal Thoughts:  No  Memory:  WNL  Judgement:  Good  Insight:  Good  Psychomotor Activity:  Normal  Concentration:  Concentration: Fair and Attention Span: Good  Recall:  Good  Fund of Knowledge: Good  Language: Good  Assets:  Desire for Improvement  ADL's:  Intact  Cognition: WNL  Prognosis:  Good    DIAGNOSES:    ICD-10-CM   1. Insomnia, unspecified type  G47.00   2. Mixed obsessional thoughts and acts  F42.2   3. Generalized anxiety disorder  F41.1   4. Attention deficit hyperactivity disorder (ADHD), combined type  F90.2     Receiving Psychotherapy: No    RECOMMENDATIONS:  PDMP was reviewed. I spent 20 minutes with him. He will be seeing his cardiologist soon for his annual checkup and will discuss his blood pressure with him.  He is on medicine for it already and states it has been fine anytime he is checked it recently therefore I am not concerned about this mildly elevated reading. Discontinue Wellbutrin which he has already done. Restart Adderall 10 mg, 1/2-1 p.o. every morning.  (In the past he only took 5 mg most of the time.) Continue Lunesta 1 mg nightly as needed sleep. Continue Luvox 100 mg, one half p.o. nightly. Return in 3 months.  [name], PA-C

## 2019-09-01 ENCOUNTER — Ambulatory Visit: Payer: 59 | Admitting: Orthopedic Surgery

## 2019-09-01 ENCOUNTER — Other Ambulatory Visit: Payer: Self-pay

## 2019-09-01 DIAGNOSIS — M5126 Other intervertebral disc displacement, lumbar region: Secondary | ICD-10-CM

## 2019-09-03 ENCOUNTER — Encounter: Payer: Self-pay | Admitting: Orthopedic Surgery

## 2019-09-03 NOTE — Progress Notes (Signed)
Office Visit Note   Patient: Albert [name]           Date of Birth: 08-Sep-1975           MRN: [id number] Visit Date: 09/01/2019 Requested by: Esperanza Richters, PA-C [address] [place],  [place] [address] PCP: Marisue Brooklyn  Subjective: Chief Complaint  Patient presents with  . Lower Back - Pain    HPI: Albert [name] is a 44 y.o. male who presents to the office complaining of low back pain with left-sided leg pain.  Patient returns with return of his left leg radicular symptoms.  He was doing well at his last office visit after his course of oral steroids significantly improved his symptoms.  Since discontinuing that Dosepak of steroids, his symptoms have returned.  He notes left-sided leg pain posteriorly that is worse with sitting.  He denies any red flag symptoms including loss of bowel or bladder control, sexual dysfunction, saddle anesthesia.  He does not take any blood thinners.  Denies any weakness.  He has been taking NSAIDs as needed..                ROS:  All systems reviewed are negative as they relate to the chief complaint within the history of present illness.  Patient denies fevers or chills.  Assessment & Plan: Visit Diagnoses:  1. Protrusion of lumbar intervertebral disc     Plan: Patient is a 44 year old male who returns complaining of left leg radicular symptoms.  He has no red flag signs or weakness but he does have pain that is causing him to alter his daily lifestyle.  He was previously controlling this with an oral course of steroids but this has worn off.  He requests epidural steroid injections.  Refer to Dr. Naaman Plummer for lumbar spine ESI's.  Follow-up as needed.  Continue with core strengthening and stretching as well once pain is improved with epidural steroid injections.  Follow-Up Instructions: No follow-ups on file.   Orders:  Orders Placed This Encounter  Procedures  . Ambulatory referral to Physical Medicine Rehab   No orders  of the defined types were placed in this encounter.     Procedures: No procedures performed   Clinical Data: No additional findings.  Objective: Vital Signs: There were no vitals taken for this visit.  Physical Exam:  Constitutional: Patient appears well-developed HEENT:  Head: Normocephalic Eyes:EOM are normal Neck: Normal range of motion Cardiovascular: Normal rate Pulmonary/chest: Effort normal Neurologic: Patient is alert Skin: Skin is warm Psychiatric: Patient has normal mood and affect  Ortho Exam:  No significant tenderness to palpation throughout the axial lumbar spine or paraspinal musculature.  5/5 motor strength of the bilateral hip flexors, quadriceps, hamstring, dorsiflexion, plantarflexion.  Sensation intact through all dermatomes of the bilateral lower extremities.  No evidence of hypo or hyperreflexia on exam.  Specialty Comments:  No specialty comments available.  Imaging: No results found.   PMFS History: Patient Active Problem List   Diagnosis Date Noted  . Essential hypertension 08/04/2018  . OCD (obsessive compulsive disorder) 05/25/2018  . Insomnia 05/25/2018  . Cardiac risk counseling 07/20/2017  . Closed nondisplaced fracture of neck of fifth metacarpal bone of right hand 07/10/2016  . Attention deficit hyperactivity disorder (ADHD) 06/19/2016  . Thrombosed external hemorrhoid 10/06/2015  . CONTACT DERMATITIS&OTHER ECZEMA DUE UNSPEC CAUSE 10/06/2009  . NECK PAIN, ACUTE 09/13/2009  . TOBACCO USE, QUIT 03/16/2009  . DEPRESSION 02/20/2009  Past Medical History:  Diagnosis Date  . Attention deficit hyperactivity disorder (ADHD) 06/19/2016  . Closed nondisplaced fracture of neck of fifth metacarpal bone of right hand 07/10/2016  . CONTACT DERMATITIS&OTHER ECZEMA DUE [place] CAUSE 10/06/2009   Qualifier: Diagnosis of  By: Ronnald Ramp MD, Arvid Right.   . Depression   . DEPRESSION 02/20/2009   Qualifier: Diagnosis of  By: Ronnald Ramp MD, Arvid Right.   .  Hyperlipidemia   . NECK PAIN, ACUTE 09/13/2009   Qualifier: Diagnosis of  By: Ronnald Ramp MD, Arvid Right.   . Thrombosed external hemorrhoid 10/06/2015   Symptoms and exam consistent with thrombosed external hemorrhoid. Patient declines incise and drain at this time. Treat conservatively with hydrocortisone rectal cream and Tylenol 3 as needed for discomfort. Recommend sitz bath. Follow-up if symptoms worsen or do not improve or would like to seek interventional procedure.   . TOBACCO USE, QUIT 03/16/2009   Qualifier: Diagnosis of  By: Doralee Albino      Family History  Problem Relation Age of Onset  . Healthy Mother   . Hyperlipidemia Paternal Grandfather   . Heart disease Paternal Grandfather   . Heart failure Paternal Grandfather     Past Surgical History:  Procedure Laterality Date  . HERNIA REPAIR     Social History   Occupational History  . Occupation: Sales  Tobacco Use  . Smoking status: Never Smoker  . Smokeless tobacco: Never Used  . Tobacco comment: socially smoked years ago.  Substance and Sexual Activity  . Alcohol use: Yes    Alcohol/week: 5.0 standard drinks    Types: 5 Glasses of wine per week  . Drug use: No  . Sexual activity: Yes

## 2019-09-10 ENCOUNTER — Other Ambulatory Visit: Payer: Self-pay | Admitting: Cardiology

## 2019-09-23 ENCOUNTER — Ambulatory Visit: Payer: Self-pay

## 2019-09-23 ENCOUNTER — Encounter: Payer: Self-pay | Admitting: Physical Medicine and Rehabilitation

## 2019-09-23 ENCOUNTER — Other Ambulatory Visit: Payer: Self-pay

## 2019-09-23 ENCOUNTER — Ambulatory Visit (INDEPENDENT_AMBULATORY_CARE_PROVIDER_SITE_OTHER): Payer: 59 | Admitting: Physical Medicine and Rehabilitation

## 2019-09-23 VITALS — BP 138/90 | HR 73

## 2019-09-23 DIAGNOSIS — M5116 Intervertebral disc disorders with radiculopathy, lumbar region: Secondary | ICD-10-CM

## 2019-09-23 MED ORDER — METHYLPREDNISOLONE ACETATE 80 MG/ML IJ SUSP
40.0000 mg | Freq: Once | INTRAMUSCULAR | Status: AC
  Administered 2019-09-23: 40 mg

## 2019-09-23 NOTE — Progress Notes (Signed)
.  Numeric Pain Rating Scale and Functional Assessment Average Pain 7   In the last MONTH (on 0-10 scale) has pain interfered with the following?  1. General activity like being  able to carry out your everyday physical activities such as walking, climbing stairs, carrying groceries, or moving a chair?  Rating(7)   +Driver, -BT, -Dye Allergies.   

## 2019-09-24 NOTE — Procedures (Signed)
Lumbar Epidural Steroid Injection - Interlaminar Approach with Fluoroscopic Guidance  Patient: Albert [name]      Date of Birth: [birth year] MRN: [id number] PCP: Esperanza Richters, PA-C      Visit Date: 09/23/2019   Universal Protocol:     Consent Given By: the patient  Position: PRONE  Additional Comments: Vital signs were monitored before and after the procedure. Patient was prepped and draped in the usual sterile fashion. The correct patient, procedure, and site was verified.   Injection Procedure Details:  Procedure Site One Meds Administered:  Meds ordered this encounter  Medications  . methylPREDNISolone acetate (DEPO-MEDROL) injection 40 mg     Laterality: Left  Location/Site:  L5-S1  Needle size: 20 G  Needle type: Tuohy  Needle Placement: Paramedian epidural  Findings:   -Comments: Excellent flow of contrast into the epidural space.  Procedure Details: Using a paramedian approach from the side mentioned above, the region overlying the inferior lamina was localized under fluoroscopic visualization and the soft tissues overlying this structure were infiltrated with 4 ml. of 1% Lidocaine without Epinephrine. The Tuohy needle was inserted into the epidural space using a paramedian approach.   The epidural space was localized using loss of resistance along with lateral and bi-planar fluoroscopic views.  After negative aspirate for air, blood, and CSF, a 2 ml. volume of Isovue-250 was injected into the epidural space and the flow of contrast was observed. Radiographs were obtained for documentation purposes.    The injectate was administered into the level noted above.   Additional Comments:  The patient tolerated the procedure well Dressing: 2 x 2 sterile gauze and Band-Aid    Post-procedure details: Patient was observed during the procedure. Post-procedure instructions were reviewed.  Patient left the clinic in stable condition.

## 2019-09-24 NOTE — Progress Notes (Signed)
Albert [name] - 44 y.o. male MRN [id number]  Date of birth: 06-28-1975  Office Visit Note: Visit Date: 09/23/2019 PCP: [name], PA-C Referred by: [name], PA-C  Subjective: Chief Complaint  Patient presents with  . Lower Back - Pain   HPI:  Albert [name] is a 44 y.o. male who comes in today At the request of Dr. Anderson Malta for left interlaminar epidural steroid injection L5-S1.  Patient with chronic history of back pain off and on now with left lower back pain referring into the leg to a degree.  He does use some tramadol for pain relief.  MRI obtained and reviewed with the patient today.  He does have L5-S1 degenerative disc height loss with endplate reactive changes and broad disc bulging in general with some impact on the lateral recesses left more than right.  We will have the patient come back in a couple weeks for either repeat injection if needed or potential transforaminal injection at S1.  Patient will continue with avoiding positions and activities that flareup the pain as well as continue with core strengthening and physical therapy.  Initial severity of pain he did take meloxicam as well as muscle relaxer and prednisone.  Prednisone seem to help the most but pain returned after he stopped taking it.  The patient has failed conservative care including home exercise, medications, time and activity modification.  This injection will be diagnostic and hopefully therapeutic.  Please see requesting physician notes for further details and justification.   ROS Otherwise per HPI.  Assessment & Plan: Visit Diagnoses:  1. Radiculopathy due to lumbar intervertebral disc disorder     Plan: No additional findings.   Meds & Orders:  Meds ordered this encounter  Medications  . methylPREDNISolone acetate (DEPO-MEDROL) injection 40 mg    Orders Placed This Encounter  Procedures  . XR C-ARM NO REPORT  . Epidural Steroid injection    Follow-up: Return in about 2 weeks  (around 10/07/2019).   Procedures: No procedures performed  Lumbar Epidural Steroid Injection - Interlaminar Approach with Fluoroscopic Guidance  Patient: Albert [name]      Date of Birth: Mar 24, 1976 MRN: [id number] PCP: [name], PA-C      Visit Date: 09/23/2019   Universal Protocol:     Consent Given By: the patient  Position: PRONE  Additional Comments: Vital signs were monitored before and after the procedure. Patient was prepped and draped in the usual sterile fashion. The correct patient, procedure, and site was verified.   Injection Procedure Details:  Procedure Site One Meds Administered:  Meds ordered this encounter  Medications  . methylPREDNISolone acetate (DEPO-MEDROL) injection 40 mg     Laterality: Left  Location/Site:  L5-S1  Needle size: 20 G  Needle type: Tuohy  Needle Placement: Paramedian epidural  Findings:   -Comments: Excellent flow of contrast into the epidural space.  Procedure Details: Using a paramedian approach from the side mentioned above, the region overlying the inferior lamina was localized under fluoroscopic visualization and the soft tissues overlying this structure were infiltrated with 4 ml. of 1% Lidocaine without Epinephrine. The Tuohy needle was inserted into the epidural space using a paramedian approach.   The epidural space was localized using loss of resistance along with lateral and bi-planar fluoroscopic views.  After negative aspirate for air, blood, and CSF, a 2 ml. volume of Isovue-250 was injected into the epidural space and the flow of contrast was observed. Radiographs were obtained for documentation  purposes.    The injectate was administered into the level noted above.   Additional Comments:  The patient tolerated the procedure well Dressing: 2 x 2 sterile gauze and Band-Aid    Post-procedure details: Patient was observed during the procedure. Post-procedure instructions were reviewed.  Patient  left the clinic in stable condition.     Clinical History: MRI LUMBAR SPINE WITHOUT CONTRAST  TECHNIQUE: Multiplanar, multisequence MR imaging of the lumbar spine was performed. No intravenous contrast was administered.  COMPARISON:  Prior radiograph from 07/08/2019.  FINDINGS: Segmentation: Standard. Lowest well-formed disc space labeled the L5-S1 level.  Alignment: Physiologic with preservation of the normal lumbar lordosis. No listhesis.  Vertebrae: Vertebral body height maintained without evidence for acute or chronic fracture. Bone marrow signal intensity within normal limits. No discrete or worrisome osseous lesions. Small endplate Schmorl's node deformity noted at the inferior endplate of L3 with associated mild reactive marrow edema. No other abnormal marrow edema.  Conus medullaris and cauda equina: Conus extends to the L1 level. Conus and cauda equina appear normal.  Paraspinal and other soft tissues: Paraspinous soft tissues within normal limits. Visualized visceral structures are normal.  Disc levels:  T12-L1: Unremarkable.  L1-2:  Unremarkable.  L2-3:  Unremarkable.  L3-4: Minimal annular disc bulge. No canal or lateral recess stenosis. Foramina remain patent.  L4-5: Minimal annular disc bulge. Mild facet hypertrophy. No canal or lateral recess stenosis. Foramina remain patent.  L5-S1: Chronic intervertebral disc space narrowing with disc desiccation and diffuse disc bulge. Reactive endplate changes with mild marginal endplate osteophytic spurring. Superimposed broad central disc protrusion with associated annular fissure. Slight inferior migration. Protruding disc abuts the descending S1 nerve roots bilaterally, slightly greater on the right (series 5, image 38). Right S1 nerve root slightly displaced posteriorly. Mild right greater than left facet hypertrophy. No significant canal or lateral recess stenosis. Mild to moderate right  greater than left L5 foraminal narrowing.  IMPRESSION: 1. Broad central disc protrusion at L5-S1, contacting and potentially affecting either of the descending S1 nerve roots bilaterally. 2. Mild to moderate bilateral L5 foraminal stenosis related to disc bulge, reactive endplate changes, and facet hypertrophy. 3. Mild Schmorl's node deformity with associated reactive marrow edema at the inferior endplate of L3. Finding could contribute to lower back pain.   Electronically Signed   By: Rise Mu M.D.   On: 08/05/2019 22:47     Objective:  VS:  HT:    WT:   BMI:     BP:138/90  HR:73bpm  TEMP: ( )  RESP:  Physical Exam  Ortho Exam Imaging: XR C-ARM NO REPORT  Result Date: 09/23/2019 Please see Notes tab for imaging impression.

## 2019-10-07 ENCOUNTER — Ambulatory Visit: Payer: Self-pay

## 2019-10-07 ENCOUNTER — Encounter: Payer: Self-pay | Admitting: Physical Medicine and Rehabilitation

## 2019-10-07 ENCOUNTER — Ambulatory Visit (INDEPENDENT_AMBULATORY_CARE_PROVIDER_SITE_OTHER): Payer: 59 | Admitting: Physical Medicine and Rehabilitation

## 2019-10-07 VITALS — BP 137/85 | HR 61 | Temp 97.0°F

## 2019-10-07 DIAGNOSIS — M5116 Intervertebral disc disorders with radiculopathy, lumbar region: Secondary | ICD-10-CM | POA: Diagnosis not present

## 2019-10-07 MED ORDER — METHYLPREDNISOLONE ACETATE 80 MG/ML IJ SUSP
40.0000 mg | Freq: Once | INTRAMUSCULAR | Status: AC
  Administered 2019-10-07: 40 mg

## 2019-10-07 NOTE — Progress Notes (Signed)
 .  Numeric Pain Rating Scale and Functional Assessment Average Pain 7   In the last MONTH (on 0-10 scale) has pain interfered with the following?  1. General activity like being  able to carry out your everyday physical activities such as walking, climbing stairs, carrying groceries, or moving a chair?  Rating(4)   +Driver, -BT, -Dye Allergies.  

## 2019-10-07 NOTE — Procedures (Signed)
S1 Lumbosacral Transforaminal Epidural Steroid Injection - Sub-Pedicular Approach with Fluoroscopic Guidance   Patient: Albert [name]      Date of Birth: 1975-12-07 MRN: [id number] PCP: Esperanza Richters, PA-C      Visit Date: 10/07/2019   Universal Protocol:    Date/Time: 04/29/212:24 PM  Consent Given By: the patient  Position:  PRONE  Additional Comments: Vital signs were monitored before and after the procedure. Patient was prepped and draped in the usual sterile fashion. The correct patient, procedure, and site was verified.   Injection Procedure Details:  Procedure Site One Meds Administered:  Meds ordered this encounter  Medications  . methylPREDNISolone acetate (DEPO-MEDROL) injection 40 mg    Laterality: Left  Location/Site:  S1 Foramen   Needle size: 22 ga.  Needle type: Spinal  Needle Placement: Transforaminal  Findings:   -Comments: Excellent flow of contrast along the nerve and into the epidural space.  Epidurogram: Contrast epidurogram showed no nerve root cut off or restricted flow pattern.  Procedure Details: After squaring off the sacral end-plate to get a true AP view, the C-arm was positioned so that the best possible view of the S1 foramen was visualized. The soft tissues overlying this structure were infiltrated with 2-3 ml. of 1% Lidocaine without Epinephrine.    The spinal needle was inserted toward the target using a "trajectory" view along the fluoroscope beam.  Under AP and lateral visualization, the needle was advanced so it did not puncture dura. Biplanar projections were used to confirm position. Aspiration was confirmed to be negative for CSF and/or blood. A 1-2 ml. volume of Isovue-250 was injected and flow of contrast was noted at each level. Radiographs were obtained for documentation purposes.   After attaining the desired flow of contrast documented above, a 0.5 to 1.0 ml test dose of 0.25% Marcaine was injected into each respective  transforaminal space.  The patient was observed for 90 seconds post injection.  After no sensory deficits were reported, and normal lower extremity motor function was noted,   the above injectate was administered so that equal amounts of the injectate were placed at each foramen (level) into the transforaminal epidural space.   Additional Comments:  The patient tolerated the procedure well Dressing: Band-Aid with 2 x 2 sterile gauze    Post-procedure details: Patient was observed during the procedure. Post-procedure instructions were reviewed.  Patient left the clinic in stable condition.

## 2019-10-08 NOTE — Progress Notes (Signed)
Albert [name] - 44 y.o. male MRN [id number]  Date of birth: [birth year]  Office Visit Note: Visit Date: 10/07/2019 PCP: Esperanza Richters, PA-C Referred by: Esperanza Richters, PA-C  Subjective: Chief Complaint  Patient presents with  . Lower Back - Pain   HPI:  Albert [name] is a 44 y.o. male who comes in today For 2-week follow-up and planned repeat epidural injection if needed.  Patient had L5-S1 interlaminar epidural steroid injection without much relief.  He did state for a couple of days he felt like it was helping but never really got to where he like to be and it did seem to come back.  His pain complaints are essentially left much more than right hip buttock and hamstring consistent more with an S1 nerve root irritation along with the disc problems he has at L5-S1.  Approaches diagnostic and therapeutic left S1 transforaminal epidural steroid injection and see how he does with that.  Could look at change in medication and regrouping with therapy.  Could consider surgical consultation.  ROS Otherwise per HPI.  Assessment & Plan: Visit Diagnoses:  1. Radiculopathy due to lumbar intervertebral disc disorder     Plan: No additional findings.   Meds & Orders:  Meds ordered this encounter  Medications  . methylPREDNISolone acetate (DEPO-MEDROL) injection 40 mg    Orders Placed This Encounter  Procedures  . XR C-ARM NO REPORT  . Epidural Steroid injection    Follow-up: Return if symptoms worsen or fail to improve.   Procedures: No procedures performed  S1 Lumbosacral Transforaminal Epidural Steroid Injection - Sub-Pedicular Approach with Fluoroscopic Guidance   Patient: Albert [name]      Date of Birth: 09-04-1975 MRN: [id number] PCP: Esperanza Richters, PA-C      Visit Date: 10/07/2019   Universal Protocol:    Date/Time: 04/29/212:24 PM  Consent Given By: the patient  Position:  PRONE  Additional Comments: Vital signs were monitored before and after the  procedure. Patient was prepped and draped in the usual sterile fashion. The correct patient, procedure, and site was verified.   Injection Procedure Details:  Procedure Site One Meds Administered:  Meds ordered this encounter  Medications  . methylPREDNISolone acetate (DEPO-MEDROL) injection 40 mg    Laterality: Left  Location/Site:  S1 Foramen   Needle size: 22 ga.  Needle type: Spinal  Needle Placement: Transforaminal  Findings:   -Comments: Excellent flow of contrast along the nerve and into the epidural space.  Epidurogram: Contrast epidurogram showed no nerve root cut off or restricted flow pattern.  Procedure Details: After squaring off the sacral end-plate to get a true AP view, the C-arm was positioned so that the best possible view of the S1 foramen was visualized. The soft tissues overlying this structure were infiltrated with 2-3 ml. of 1% Lidocaine without Epinephrine.    The spinal needle was inserted toward the target using a "trajectory" view along the fluoroscope beam.  Under AP and lateral visualization, the needle was advanced so it did not puncture dura. Biplanar projections were used to confirm position. Aspiration was confirmed to be negative for CSF and/or blood. A 1-2 ml. volume of Isovue-250 was injected and flow of contrast was noted at each level. Radiographs were obtained for documentation purposes.   After attaining the desired flow of contrast documented above, a 0.5 to 1.0 ml test dose of 0.25% Marcaine was injected into each respective transforaminal space.  The patient was observed for 90 seconds post injection.  After no sensory deficits were reported, and normal lower extremity motor function was noted,   the above injectate was administered so that equal amounts of the injectate were placed at each foramen (level) into the transforaminal epidural space.   Additional Comments:  The patient tolerated the procedure well Dressing: Band-Aid with  2 x 2 sterile gauze    Post-procedure details: Patient was observed during the procedure. Post-procedure instructions were reviewed.  Patient left the clinic in stable condition.     Clinical History: MRI LUMBAR SPINE WITHOUT CONTRAST  TECHNIQUE: Multiplanar, multisequence MR imaging of the lumbar spine was performed. No intravenous contrast was administered.  COMPARISON:  Prior radiograph from 07/08/2019.  FINDINGS: Segmentation: Standard. Lowest well-formed disc space labeled the L5-S1 level.  Alignment: Physiologic with preservation of the normal lumbar lordosis. No listhesis.  Vertebrae: Vertebral body height maintained without evidence for acute or chronic fracture. Bone marrow signal intensity within normal limits. No discrete or worrisome osseous lesions. Small endplate Schmorl's node deformity noted at the inferior endplate of L3 with associated mild reactive marrow edema. No other abnormal marrow edema.  Conus medullaris and cauda equina: Conus extends to the L1 level. Conus and cauda equina appear normal.  Paraspinal and other soft tissues: Paraspinous soft tissues within normal limits. Visualized visceral structures are normal.  Disc levels:  T12-L1: Unremarkable.  L1-2:  Unremarkable.  L2-3:  Unremarkable.  L3-4: Minimal annular disc bulge. No canal or lateral recess stenosis. Foramina remain patent.  L4-5: Minimal annular disc bulge. Mild facet hypertrophy. No canal or lateral recess stenosis. Foramina remain patent.  L5-S1: Chronic intervertebral disc space narrowing with disc desiccation and diffuse disc bulge. Reactive endplate changes with mild marginal endplate osteophytic spurring. Superimposed broad central disc protrusion with associated annular fissure. Slight inferior migration. Protruding disc abuts the descending S1 nerve roots bilaterally, slightly greater on the right (series 5, image 38). Right S1 nerve root  slightly displaced posteriorly. Mild right greater than left facet hypertrophy. No significant canal or lateral recess stenosis. Mild to moderate right greater than left L5 foraminal narrowing.  IMPRESSION: 1. Broad central disc protrusion at L5-S1, contacting and potentially affecting either of the descending S1 nerve roots bilaterally. 2. Mild to moderate bilateral L5 foraminal stenosis related to disc bulge, reactive endplate changes, and facet hypertrophy. 3. Mild Schmorl's node deformity with associated reactive marrow edema at the inferior endplate of L3. Finding could contribute to lower back pain.   Electronically Signed   By: Jeannine Boga M.D.   On: 08/05/2019 22:47     Objective:  VS:  HT:    WT:   BMI:     BP:137/85  HR:61bpm  TEMP:(!) 97 F (36.1 C)( )  RESP:  Physical Exam  Ortho Exam Imaging: XR C-ARM NO REPORT  Result Date: 10/07/2019 Please see Notes tab for imaging impression.

## 2019-11-09 ENCOUNTER — Other Ambulatory Visit: Payer: Self-pay

## 2019-11-09 ENCOUNTER — Other Ambulatory Visit: Payer: Self-pay | Admitting: Cardiology

## 2019-11-09 MED ORDER — TELMISARTAN-HCTZ 40-12.5 MG PO TABS: 1.0000 | ORAL_TABLET | Freq: Every day | ORAL | 0 refills | Status: DC

## 2019-11-09 NOTE — Telephone Encounter (Signed)
Medication refilled

## 2019-11-09 NOTE — Telephone Encounter (Signed)
*  STAT* If patient is at the pharmacy, call can be transferred to refill team.   1. Which medications need to be refilled? (please list name of each medication and dose if known)  telmisartan-hydrochlorothiazide (MICARDIS HCT) 40-12.5 MG tablet  2. Which pharmacy/location (including street and city if local pharmacy) is medication to be sent to? [id number] - [place], [place] - [address] AT [address] OF [facility] - [address]  3. Do they need a 30 day or 90 day supply? 30 day   Patient is out of medication. Pt has appointment scheduled for 12/22/19.

## 2019-11-15 ENCOUNTER — Ambulatory Visit: Payer: 59 | Admitting: Cardiology

## 2019-11-24 ENCOUNTER — Ambulatory Visit (INDEPENDENT_AMBULATORY_CARE_PROVIDER_SITE_OTHER): Payer: 59 | Admitting: Physician Assistant

## 2019-11-24 ENCOUNTER — Telehealth: Payer: Self-pay

## 2019-11-24 ENCOUNTER — Other Ambulatory Visit: Payer: Self-pay

## 2019-11-24 ENCOUNTER — Ambulatory Visit: Payer: 59 | Admitting: Orthopedic Surgery

## 2019-11-24 ENCOUNTER — Encounter: Payer: Self-pay | Admitting: Physician Assistant

## 2019-11-24 VITALS — BP 135/91 | HR 65

## 2019-11-24 DIAGNOSIS — F902 Attention-deficit hyperactivity disorder, combined type: Secondary | ICD-10-CM

## 2019-11-24 DIAGNOSIS — M5126 Other intervertebral disc displacement, lumbar region: Secondary | ICD-10-CM

## 2019-11-24 DIAGNOSIS — F411 Generalized anxiety disorder: Secondary | ICD-10-CM

## 2019-11-24 DIAGNOSIS — F429 Obsessive-compulsive disorder, unspecified: Secondary | ICD-10-CM | POA: Diagnosis not present

## 2019-11-24 DIAGNOSIS — G4700 Insomnia, unspecified: Secondary | ICD-10-CM

## 2019-11-24 MED ORDER — AMPHETAMINE-DEXTROAMPHETAMINE 10 MG PO TABS: 10.0000 mg | ORAL_TABLET | Freq: Every day | ORAL | 0 refills | Status: DC

## 2019-11-24 MED ORDER — AMPHETAMINE-DEXTROAMPHETAMINE 10 MG PO TABS
10.0000 mg | ORAL_TABLET | Freq: Every day | ORAL | 0 refills | Status: DC
Start: 2020-01-22 — End: 2019-12-22

## 2019-11-24 MED ORDER — LORAZEPAM 0.5 MG PO TABS: 0.2500 mg | ORAL_TABLET | Freq: Two times a day (BID) | ORAL | 1 refills | Status: DC | PRN

## 2019-11-24 MED ORDER — ESZOPICLONE 1 MG PO TABS: 1.0000 mg | ORAL_TABLET | Freq: Every evening | ORAL | 5 refills | Status: DC | PRN

## 2019-11-24 MED ORDER — FLUVOXAMINE MALEATE 100 MG PO TABS: 150.0000 mg | ORAL_TABLET | Freq: Every day | ORAL | 1 refills | Status: DC

## 2019-11-24 MED ORDER — AMPHETAMINE-DEXTROAMPHETAMINE 10 MG PO TABS
10.0000 mg | ORAL_TABLET | Freq: Every day | ORAL | 0 refills | Status: DC
Start: 2019-12-23 — End: 2019-12-22

## 2019-11-24 NOTE — Telephone Encounter (Signed)
Patient was seen by Dr August Saucer today. He would like to refer patient to see Dr Ophelia Charter for lumbar surgery consultation. Can you please call patient to schedule appointment? Thanks.

## 2019-11-24 NOTE — Progress Notes (Signed)
[facility] Med Check  Patient ID: Albert [name],  MRN: [id number]  PCP: [name], PA-C  Date of Evaluation: 11/26/2019 Time spent:20 minutes  Chief Complaint:  Chief Complaint    Medication Refill      HISTORY/CURRENT STATUS: HPI For routine med check.  Has had more ruminating thoughts, 'thoughts go in a loop.'  He increased the Luvox on his own and is now taking 150 mg.  States it is helping right now.  In the past when we have increased it, it caused sexual side effects but he is not having major problems with that right now.  The benefits to his mental health are outweighing the sexual issues.  He is sleeping well with the Lunesta.  Work is busy but everything is going fine there. Adderall is helping.  He is more able to concentrate and finish things in a timely manner.  Patient denies loss of interest in usual activities and is able to enjoy things.  Denies decreased energy or motivation.  Appetite has not changed.  No extreme sadness, tearfulness, or feelings of hopelessness.  Denies any changes in concentration, making decisions or remembering things.  Denies suicidal or homicidal thoughts.  Patient denies increased energy with decreased need for sleep, no increased talkativeness, no racing thoughts, no impulsivity or risky behaviors, no increased spending, no increased libido, no grandiosity.  Denies dizziness, syncope, seizures, numbness, tingling, tremor, tics, unsteady gait, slurred speech, confusion. Denies muscle or joint pain, stiffness, or dystonia.  Individual Medical History/ Review of Systems: Changes? :No    Past medications for mental health diagnoses include: Luvox, Lunesta, Xanax, Wellbutrin, Risperdal, Prozac, Ambien, Deplin, Abilify, naltrexone, Adderall  Allergies: Sulfamethoxazole and Sulfonamide derivatives  Current Medications:  Current Outpatient Medications:  .  amphetamine-dextroamphetamine (ADDERALL) 10 MG tablet, Take 1 tablet (10 mg  total) by mouth daily with breakfast., Disp: 30 tablet, Rfl: 0 .  clomiPHENE (CLOMID) 50 MG tablet, Take 25 mg by mouth daily. , Disp: , Rfl:  .  eszopiclone (LUNESTA) 1 MG TABS tablet, Take 1 tablet (1 mg total) by mouth at bedtime as needed for sleep. Take immediately before bedtime, Disp: 30 tablet, Rfl: 5 .  fluvoxaMINE (LUVOX) 100 MG tablet, Take 1.5 tablets (150 mg total) by mouth at bedtime., Disp: 135 tablet, Rfl: 1 .  telmisartan-hydrochlorothiazide (MICARDIS HCT) 40-12.5 MG tablet, Take 1 tablet by mouth daily. PATIENT MUST KEEP UPCOMING APPOINTMENT FOR FURTHER REFILLS, Disp: 45 tablet, Rfl: 0 .  [START ON 12/23/2019] amphetamine-dextroamphetamine (ADDERALL) 10 MG tablet, Take 1 tablet (10 mg total) by mouth daily with breakfast., Disp: 30 tablet, Rfl: 0 .  [START ON 01/22/2020] amphetamine-dextroamphetamine (ADDERALL) 10 MG tablet, Take 1 tablet (10 mg total) by mouth daily with breakfast., Disp: 30 tablet, Rfl: 0 .  LORazepam (ATIVAN) 0.5 MG tablet, Take 0.5-1 tablets (0.25-0.5 mg total) by mouth 2 (two) times daily as needed for anxiety., Disp: 30 tablet, Rfl: 1 .  naltrexone (DEPADE) 50 MG tablet, Take 1 tablet (50 mg total) by mouth daily. (Patient not taking: Reported on 11/24/2019), Disp: 30 tablet, Rfl: 0 Medication Side Effects: none  Family Medical/ Social History: Changes? No  MENTAL HEALTH EXAM:  Blood pressure (!) 135/91, pulse 65.There is no height or weight on file to calculate BMI.  General Appearance: Casual, Neat and Well Groomed  Eye Contact:  Good  Speech:  Clear and Coherent  Volume:  Normal  Mood:  Euthymic  Affect:  Appropriate  Thought Process:  Goal Directed and Descriptions of  Associations: Intact  Orientation:  Full (Time, Place, and Person)  Thought Content: Logical   Suicidal Thoughts:  No  Homicidal Thoughts:  No  Memory:  WNL  Judgement:  Good  Insight:  Good  Psychomotor Activity:  Normal  Concentration:  Concentration: Good and Attention Span:  Good  Recall:  Good  Fund of Knowledge: Good  Language: Good  Assets:  Desire for Improvement  ADL's:  Intact  Cognition: WNL  Prognosis:  Good    DIAGNOSES:    ICD-10-CM   1. Obsessive-compulsive disorder, unspecified type  F42.9   2. Attention deficit hyperactivity disorder (ADHD), combined type  F90.2   3. Generalized anxiety disorder  F41.1   4. Insomnia, unspecified type  G47.00     Receiving Psychotherapy: No    RECOMMENDATIONS:  PDMP was reviewed. I provided 20 minutes of face-to-face time during this encounter. Discussed that increasing the Luvox is okay because he knows the maximum dose is 200 mg usually, I would appreciate it if he calls to let me know the dose he is on though. Continue Luvox 100 mg, 1.5 pills nightly. Continue Adderall 10 mg, 1 p.o. every morning. Continue Ativan 0.5 mg, 1/2-1 twice daily as needed, sparingly. Continue Lunesta 1 mg nightly as needed sleep. Return in 4 months.  [name], PA-C

## 2019-11-25 ENCOUNTER — Encounter: Payer: Self-pay | Admitting: Orthopedic Surgery

## 2019-11-25 NOTE — Progress Notes (Signed)
Office Visit Note   Patient: Albert [name]           Date of Birth: 1975/06/23           MRN: [id number] Visit Date: 11/24/2019 Requested by: Esperanza Richters, PA-C [address] [place],  [place] [address] PCP: Marisue Brooklyn  Subjective: Chief Complaint  Patient presents with  . Lower Back - Follow-up    HPI: Albert [name] is a 44 y.o. male who presents to the office complaining of back pain.  He complains of low back pain with radiation to the right buttocks and the left buttocks as well as down to the left upper thigh.  He has had prednisone pack in the past with some relief as well as 2 injections with Dr. Alvester Morin.  First injection provided no relief but the second injection provided 50% of relief for a few weeks.  However he is not satisfied with this.  Right leg symptoms are new in onset.  His left radicular symptoms make up about 70% of his leg pain.  Back pain is bothering him more than the leg pain however.  He works from home and so sitting can aggravate his pain.  He is doing home exercises focused on core strength.  He denies any numbness or tingling.  He denies any groin pain.  He has never had formal physical therapy.  He takes Aleve which helps some.  He wishes to discuss surgical options as he is concerned about side effects with steroid injections.. Patient's father also ended up having surgical intervention for his back at a similar age.  That surgical intervention essentially cured his father's pain.  Cayden is having symptoms with his back but whether or not it reaches that threshold to consider surgery is worth further discussion.              ROS:  All systems reviewed are negative as they relate to the chief complaint within the history of present illness.  Patient denies fevers or chills.  Assessment & Plan: Visit Diagnoses:  1. Protrusion of lumbar intervertebral disc     Plan:  Patient is a 44 year old male who presents complaining of about 1 year  of back pain.  He is had improvement with oral prednisone course but pain returned after several weeks off of the prednisone course.  He has had 2 injections with Dr. Alvester Morin only 1 of which provided relief for several weeks.  His main concern is back pain rather than leg pain though he does have bilateral radicular leg pain.  No red flag symptoms.  He wants to discuss surgical options patient has predominantly more back pain than leg pain.Marland Kitchen  His father has had back surgery and did well with that.  Plan to refer patient to Dr. Annell Greening for further discussion on options.  Patient agreed with plan.  Follow-Up Instructions: No follow-ups on file.   Orders:  No orders of the defined types were placed in this encounter.  No orders of the defined types were placed in this encounter.     Procedures: No procedures performed   Clinical Data: No additional findings.  Objective: Vital Signs: There were no vitals taken for this visit.  Physical Exam:  Constitutional: Patient appears well-developed HEENT:  Head: Normocephalic Eyes:EOM are normal Neck: Normal range of motion Cardiovascular: Normal rate Pulmonary/chest: Effort normal Neurologic: Patient is alert Skin: Skin is warm Psychiatric: Patient has normal mood and affect  Ortho  Exam:  Moderate tenderness palpation throughout the inferior axial lumbar spine.  Mild tenderness palpation throughout the left-sided paraspinal musculature.  Negative straight leg raise bilaterally.  5/5 motor strength of the bilateral hip flexors, quadriceps, hamstring, dorsiflexion, plantarflexion.  Sensation intact through all dermatomes of the bilateral lower extremities.  No evidence of hypo or hyperreflexia  Specialty Comments:  No specialty comments available.  Imaging: No results found.   PMFS History: Patient Active Problem List   Diagnosis Date Noted  . Essential hypertension 08/04/2018  . OCD (obsessive compulsive disorder) 05/25/2018  .  Insomnia 05/25/2018  . Cardiac risk counseling 07/20/2017  . Closed nondisplaced fracture of neck of fifth metacarpal bone of right hand 07/10/2016  . Attention deficit hyperactivity disorder (ADHD) 06/19/2016  . Thrombosed external hemorrhoid 10/06/2015  . CONTACT DERMATITIS&OTHER ECZEMA DUE UNSPEC CAUSE 10/06/2009  . NECK PAIN, ACUTE 09/13/2009  . TOBACCO USE, QUIT 03/16/2009  . DEPRESSION 02/20/2009   Past Medical History:  Diagnosis Date  . Attention deficit hyperactivity disorder (ADHD) 06/19/2016  . Closed nondisplaced fracture of neck of fifth metacarpal bone of right hand 07/10/2016  . CONTACT DERMATITIS&OTHER ECZEMA DUE [place] CAUSE 10/06/2009   Qualifier: Diagnosis of  By: Ronnald Ramp MD, Arvid Right.   . Depression   . DEPRESSION 02/20/2009   Qualifier: Diagnosis of  By: Ronnald Ramp MD, Arvid Right.   . Hyperlipidemia   . NECK PAIN, ACUTE 09/13/2009   Qualifier: Diagnosis of  By: Ronnald Ramp MD, Arvid Right.   . Thrombosed external hemorrhoid 10/06/2015   Symptoms and exam consistent with thrombosed external hemorrhoid. Patient declines incise and drain at this time. Treat conservatively with hydrocortisone rectal cream and Tylenol 3 as needed for discomfort. Recommend sitz bath. Follow-up if symptoms worsen or do not improve or would like to seek interventional procedure.   . TOBACCO USE, QUIT 03/16/2009   Qualifier: Diagnosis of  By: Doralee Albino      Family History  Problem Relation Age of Onset  . Healthy Mother   . Hyperlipidemia Paternal Grandfather   . Heart disease Paternal Grandfather   . Heart failure Paternal Grandfather     Past Surgical History:  Procedure Laterality Date  . HERNIA REPAIR     Social History   Occupational History  . Occupation: Sales  Tobacco Use  . Smoking status: Never Smoker  . Smokeless tobacco: Never Used  . Tobacco comment: socially smoked years ago.  Vaping Use  . Vaping Use: Never used  Substance and Sexual Activity  . Alcohol use: Yes     Alcohol/week: 7.0 standard drinks    Types: 3 Glasses of wine, 4 Standard drinks or equivalent per week  . Drug use: No  . Sexual activity: Yes

## 2019-12-09 ENCOUNTER — Other Ambulatory Visit: Payer: Self-pay | Admitting: Urology

## 2019-12-14 ENCOUNTER — Ambulatory Visit (INDEPENDENT_AMBULATORY_CARE_PROVIDER_SITE_OTHER): Payer: 59 | Admitting: Orthopaedic Surgery

## 2019-12-14 ENCOUNTER — Encounter: Payer: Self-pay | Admitting: Orthopaedic Surgery

## 2019-12-14 DIAGNOSIS — M48061 Spinal stenosis, lumbar region without neurogenic claudication: Secondary | ICD-10-CM | POA: Diagnosis not present

## 2019-12-14 NOTE — Progress Notes (Signed)
Office Visit Note   Patient: Albert [name]           Date of Birth: 06-Jan-1976           MRN: [id number] Visit Date: 12/14/2019              Requested by: Esperanza Richters, PA-C [id number] [place],  [place] [address] PCP: Esperanza Richters, PA-C   Assessment & Plan: Visit Diagnoses:  1. Lumbar foraminal stenosis     Plan: Patient has L5-S1 disc protrusion with high intensity zone.  Some bilateral L5 foraminal stenosis.  We discussed and reviewed the MRI scan and imaging.  At present he has some days where he is having less symptoms.  He is getting some relief with Aleve.  At this point I would defer operative intervention and we reviewed the MRI scan and then discussed options including fusion versus no fusion surgeries.  I plan to recheck him in 6 months.  We discussed he may get improvement in his symptoms with time and without surgical treatment.  Follow-Up Instructions: Return in about 6 months (around 06/15/2020).   Orders:  No orders of the defined types were placed in this encounter.  No orders of the defined types were placed in this encounter.     Procedures: No procedures performed   Clinical Data: No additional findings.   Subjective: Chief Complaint  Patient presents with  . Lower Back - Pain    HPI 44 year old male here to discuss possible lumbar surgery for low back pain left greater than right leg pain.  He had history of prednisone p.o. also injections with Dr.Newton.  He states he has intermittent symptoms and pain used to be worse in the left leg now sometimes seems to be worse in the right radiates into the hamstring.  He gets some relief with the second injection.  He is taken some Aleve and he states this also helps his symptoms.  MRI scan showed L5-S1 broad-based disc protrusion in February 2021.  He has bilateral L5 foraminal stenosis with disc bulge endplate changes and facet hypertrophy.  He also has some Schmorl's nodes on the endplates  of L3.  No bowel bladder symptoms no fever or chills.  Patient used to use tobacco but has quit.  Review of Systems positive history of OCD, ADHD, depression, hypertension.     Objective: Vital Signs: BP 127/83   Pulse 68   Ht 6' (1.829 m)   Wt 190 lb (86.2 kg)   BMI 25.77 kg/m   Physical Exam Constitutional:      Appearance: He is well-developed.  HENT:     Head: Normocephalic and atraumatic.  Eyes:     Pupils: Pupils are equal, round, and reactive to light.  Neck:     Thyroid: No thyromegaly.     Trachea: No tracheal deviation.  Cardiovascular:     Rate and Rhythm: Normal rate.  Pulmonary:     Effort: Pulmonary effort is normal.     Breath sounds: No wheezing.  Abdominal:     General: Bowel sounds are normal.     Palpations: Abdomen is soft.  Skin:    General: Skin is warm and dry.     Capillary Refill: Capillary refill takes less than 2 seconds.  Neurological:     Mental Status: He is alert and oriented to person, place, and time.  Psychiatric:        Behavior: Behavior normal.  Thought Content: Thought content normal.        Judgment: Judgment normal.     Ortho Exam patient has intact ankle dorsiflexion plantarflexion normal heel toe walking minimal discomfort straight leg raising mild sciatic notch tenderness.  Knee and ankle jerk are intact.  Some discomfort with lateral bending forward flexion and extension.  Distal pulses are 2+.  Specialty Comments:  No specialty comments available.  Imaging: CLINICAL DATA:  Initial evaluation for left-sided lower back pain for past year. Evaluate for spinal stenosis.  EXAM: MRI LUMBAR SPINE WITHOUT CONTRAST  TECHNIQUE: Multiplanar, multisequence MR imaging of the lumbar spine was performed. No intravenous contrast was administered.  COMPARISON:  Prior radiograph from 07/08/2019.  FINDINGS: Segmentation: Standard. Lowest well-formed disc space labeled the L5-S1 level.  Alignment: Physiologic with  preservation of the normal lumbar lordosis. No listhesis.  Vertebrae: Vertebral body height maintained without evidence for acute or chronic fracture. Bone marrow signal intensity within normal limits. No discrete or worrisome osseous lesions. Small endplate Schmorl's node deformity noted at the inferior endplate of L3 with associated mild reactive marrow edema. No other abnormal marrow edema.  Conus medullaris and cauda equina: Conus extends to the L1 level. Conus and cauda equina appear normal.  Paraspinal and other soft tissues: Paraspinous soft tissues within normal limits. Visualized visceral structures are normal.  Disc levels:  T12-L1: Unremarkable.  L1-2:  Unremarkable.  L2-3:  Unremarkable.  L3-4: Minimal annular disc bulge. No canal or lateral recess stenosis. Foramina remain patent.  L4-5: Minimal annular disc bulge. Mild facet hypertrophy. No canal or lateral recess stenosis. Foramina remain patent.  L5-S1: Chronic intervertebral disc space narrowing with disc desiccation and diffuse disc bulge. Reactive endplate changes with mild marginal endplate osteophytic spurring. Superimposed broad central disc protrusion with associated annular fissure. Slight inferior migration. Protruding disc abuts the descending S1 nerve roots bilaterally, slightly greater on the right (series 5, image 38). Right S1 nerve root slightly displaced posteriorly. Mild right greater than left facet hypertrophy. No significant canal or lateral recess stenosis. Mild to moderate right greater than left L5 foraminal narrowing.  IMPRESSION: 1. Broad central disc protrusion at L5-S1, contacting and potentially affecting either of the descending S1 nerve roots bilaterally. 2. Mild to moderate bilateral L5 foraminal stenosis related to disc bulge, reactive endplate changes, and facet hypertrophy. 3. Mild Schmorl's node deformity with associated reactive marrow edema at the inferior  endplate of L3. Finding could contribute to lower back pain.   Electronically Signed   By: Rise Mu M.D.   On: 08/05/2019 22:47   PMFS History: Patient Active Problem List   Diagnosis Date Noted  . Lumbar foraminal stenosis 12/16/2019  . Essential hypertension 08/04/2018  . OCD (obsessive compulsive disorder) 05/25/2018  . Insomnia 05/25/2018  . Cardiac risk counseling 07/20/2017  . Closed nondisplaced fracture of neck of fifth metacarpal bone of right hand 07/10/2016  . Attention deficit hyperactivity disorder (ADHD) 06/19/2016  . Thrombosed external hemorrhoid 10/06/2015  . CONTACT DERMATITIS&OTHER ECZEMA DUE UNSPEC CAUSE 10/06/2009  . NECK PAIN, ACUTE 09/13/2009  . TOBACCO USE, QUIT 03/16/2009  . DEPRESSION 02/20/2009   Past Medical History:  Diagnosis Date  . Attention deficit hyperactivity disorder (ADHD) 06/19/2016  . Closed nondisplaced fracture of neck of fifth metacarpal bone of right hand 07/10/2016  . CONTACT DERMATITIS&OTHER ECZEMA DUE UNSPEC CAUSE 10/06/2009   Qualifier: Diagnosis of  By: Yetta Barre MD, Bernadene Bell.   . Depression   . DEPRESSION 02/20/2009   Qualifier: Diagnosis of  ByYetta Barre MD, Bernadene Bell.   . Hyperlipidemia   . NECK PAIN, ACUTE 09/13/2009   Qualifier: Diagnosis of  By: Yetta Barre MD, Bernadene Bell.   . Thrombosed external hemorrhoid 10/06/2015   Symptoms and exam consistent with thrombosed external hemorrhoid. Patient declines incise and drain at this time. Treat conservatively with hydrocortisone rectal cream and Tylenol 3 as needed for discomfort. Recommend sitz bath. Follow-up if symptoms worsen or do not improve or would like to seek interventional procedure.   . TOBACCO USE, QUIT 03/16/2009   Qualifier: Diagnosis of  By: Tora Perches      Family History  Problem Relation Age of Onset  . Healthy Mother   . Hyperlipidemia Paternal Grandfather   . Heart disease Paternal Grandfather   . Heart failure Paternal Grandfather     Past Surgical  History:  Procedure Laterality Date  . HERNIA REPAIR     Social History   Occupational History  . Occupation: Sales  Tobacco Use  . Smoking status: Never Smoker  . Smokeless tobacco: Never Used  . Tobacco comment: socially smoked years ago.  Vaping Use  . Vaping Use: Never used  Substance and Sexual Activity  . Alcohol use: Yes    Alcohol/week: 7.0 standard drinks    Types: 3 Glasses of wine, 4 Standard drinks or equivalent per week  . Drug use: No  . Sexual activity: Yes

## 2019-12-16 ENCOUNTER — Other Ambulatory Visit: Payer: Self-pay | Admitting: Cardiology

## 2019-12-16 DIAGNOSIS — M48061 Spinal stenosis, lumbar region without neurogenic claudication: Secondary | ICD-10-CM | POA: Insufficient documentation

## 2019-12-21 NOTE — Progress Notes (Signed)
Cardiology Office Note:    Date:  12/22/2019   ID:  Albert [name], DOB 05-01-1976, MRN [id number]  PCP:  Esperanza Richters, PA-C  Cardiologist:  Norman Herrlich, MD    Referring MD: Esperanza Richters, PA-C    ASSESSMENT:    1. Essential hypertension   2. Hyperlipidemia, unspecified hyperlipidemia type    PLAN:    In order of problems listed above:  1. BP is at target continue his ARB thiazide diuretic recheck renal function potassium continue to monitor home blood pressure 2. Recheck profile lipids   Next appointment: As needed   Medication Adjustments/Labs and Tests Ordered: Current medicines are reviewed at length with the patient today.  Concerns regarding medicines are outlined above.  Orders Placed This Encounter  Procedures  . Comprehensive metabolic panel  . Lipid panel   Meds ordered this encounter  Medications  . telmisartan-hydrochlorothiazide (MICARDIS HCT) 40-12.5 MG tablet    Sig: Take 1 tablet by mouth daily.    Dispense:  90 tablet    Refill:  3    Patient must keep appointment for 12-22-19 for further refills.    Chief Complaint  Patient presents with  . Follow-up  . Hypertension  . Hyperlipidemia    History of Present Illness:    Albert [name] is a 44 y.o. male with a hx of hypertension and hyperlipidemia last seen 08/04/2018. Compliance with diet, lifestyle and medications: Yes   Past Medical History:  Diagnosis Date  . Attention deficit hyperactivity disorder (ADHD) 06/19/2016  . Closed nondisplaced fracture of neck of fifth metacarpal bone of right hand 07/10/2016  . CONTACT DERMATITIS&OTHER ECZEMA DUE UNSPEC CAUSE 10/06/2009   Qualifier: Diagnosis of  By: Yetta Barre MD, Bernadene Bell.   . Depression   . DEPRESSION 02/20/2009   Qualifier: Diagnosis of  By: Yetta Barre MD, Bernadene Bell.   . Hyperlipidemia   . NECK PAIN, ACUTE 09/13/2009   Qualifier: Diagnosis of  By: Yetta Barre MD, Bernadene Bell.   . Thrombosed external hemorrhoid 10/06/2015   Symptoms and exam consistent  with thrombosed external hemorrhoid. Patient declines incise and drain at this time. Treat conservatively with hydrocortisone rectal cream and Tylenol 3 as needed for discomfort. Recommend sitz bath. Follow-up if symptoms worsen or do not improve or would like to seek interventional procedure.   . TOBACCO USE, QUIT 03/16/2009   Qualifier: Diagnosis of  By: Tora Perches      Past Surgical History:  Procedure Laterality Date  . HERNIA REPAIR      Current Medications: Current Meds  Medication Sig  . amphetamine-dextroamphetamine (ADDERALL) 10 MG tablet Take 10 mg by mouth as needed.  . clomiPHENE (CLOMID) 50 MG tablet TAKE 1/2 TABLET BY MOUTH EVERY DAY  . eszopiclone (LUNESTA) 1 MG TABS tablet Take 1 tablet (1 mg total) by mouth at bedtime as needed for sleep. Take immediately before bedtime  . fluvoxaMINE (LUVOX) 100 MG tablet Take 1.5 tablets (150 mg total) by mouth at bedtime.  Marland Kitchen LORazepam (ATIVAN) 0.5 MG tablet Take 0.5-1 tablets (0.25-0.5 mg total) by mouth 2 (two) times daily as needed for anxiety.  Marland Kitchen telmisartan-hydrochlorothiazide (MICARDIS HCT) 40-12.5 MG tablet Take 1 tablet by mouth daily.  . [DISCONTINUED] telmisartan-hydrochlorothiazide (MICARDIS HCT) 40-12.5 MG tablet TAKE 1 TABLET BY MOUTH EVERY DAY **OFFICE VISIT NEEDED FOR FUTURE REFILLS**     Allergies:   Sulfamethoxazole and Sulfonamide derivatives   Social History   Socioeconomic History  . Marital status: Married    Spouse name: Not on  file  . Number of children: 1  . Years of education: 44  . Highest education level: Not on file  Occupational History  . Occupation: Sales  Tobacco Use  . Smoking status: Never Smoker  . Smokeless tobacco: Never Used  . Tobacco comment: socially smoked years ago.  Vaping Use  . Vaping Use: Never used  Substance and Sexual Activity  . Alcohol use: Yes    Alcohol/week: 7.0 standard drinks    Types: 3 Glasses of wine, 4 Standard drinks or equivalent per week  . Drug use: No    . Sexual activity: Yes  Other Topics Concern  . Not on file  Social History Narrative   Fun: Exercise, play music, sports   Social Determinants of Health   Financial Resource Strain:   . Difficulty of Paying Living Expenses:   Food Insecurity:   . Worried About Programme researcher, broadcasting/film/video in the Last Year:   . Barista in the Last Year:   Transportation Needs:   . Freight forwarder (Medical):   Marland Kitchen Lack of Transportation (Non-Medical):   Physical Activity:   . Days of Exercise per Week:   . Minutes of Exercise per Session:   Stress:   . Feeling of Stress :   Social Connections:   . Frequency of Communication with Friends and Family:   . Frequency of Social Gatherings with Friends and Family:   . Attends Religious Services:   . Active Member of Clubs or Organizations:   . Attends Banker Meetings:   Marland Kitchen Marital Status:      Family History: The patient's family history includes Healthy in his mother; Heart disease in his paternal grandfather; Heart failure in his paternal grandfather; Hyperlipidemia in his paternal grandfather. ROS:   Please see the history of present illness.    All other systems reviewed and are negative.  EKGs/Labs/Other Studies Reviewed:    The following studies were reviewed today:    Recent Labs: 04/12/2019: ALT 18; BUN 16; Creatinine, Ser 1.13; Hemoglobin 14.8; Platelets 188.0; Potassium 3.9; Sodium 140  Recent Lipid Panel    Component Value Date/Time   CHOL 192 04/12/2019 0845   TRIG 148.0 04/12/2019 0845   HDL 36.10 (L) 04/12/2019 0845   CHOLHDL 5 04/12/2019 0845   VLDL 29.6 04/12/2019 0845   LDLCALC 126 (H) 04/12/2019 0845   LDLDIRECT 154.7 02/20/2009 0000    Physical Exam:    VS:  BP 122/90   Pulse 67   Ht 6' (1.829 m)   Wt 190 lb (86.2 kg)   SpO2 98%   BMI 25.77 kg/m     Wt Readings from Last 3 Encounters:  12/22/19 190 lb (86.2 kg)  12/14/19 190 lb (86.2 kg)  04/09/19 195 lb 9.6 oz (88.7 kg)  Repeat blood  pressure by me 138/86 130/86.  GEN:  Well nourished, well developed in no acute distress HEENT: Normal NECK: No JVD; No carotid bruits LYMPHATICS: No lymphadenopathy CARDIAC: RRR, no murmurs, rubs, gallops RESPIRATORY:  Clear to auscultation without rales, wheezing or rhonchi  ABDOMEN: Soft, non-tender, non-distended MUSCULOSKELETAL:  No edema; No deformity  SKIN: Warm and dry NEUROLOGIC:  Alert and oriented x 3 PSYCHIATRIC:  Normal affect    Signed, Norman Herrlich, MD  12/22/2019 12:08 PM    [facility]

## 2019-12-22 ENCOUNTER — Encounter: Payer: Self-pay | Admitting: Cardiology

## 2019-12-22 ENCOUNTER — Other Ambulatory Visit: Payer: Self-pay

## 2019-12-22 ENCOUNTER — Ambulatory Visit: Payer: 59 | Admitting: Cardiology

## 2019-12-22 VITALS — BP 122/90 | HR 67 | Ht 72.0 in | Wt 190.0 lb

## 2019-12-22 DIAGNOSIS — I10 Essential (primary) hypertension: Secondary | ICD-10-CM

## 2019-12-22 DIAGNOSIS — E785 Hyperlipidemia, unspecified: Secondary | ICD-10-CM | POA: Diagnosis not present

## 2019-12-22 MED ORDER — TELMISARTAN-HCTZ 40-12.5 MG PO TABS: 1.0000 | ORAL_TABLET | Freq: Every day | ORAL | 3 refills | Status: DC

## 2019-12-22 NOTE — Patient Instructions (Signed)

## 2019-12-23 ENCOUNTER — Telehealth: Payer: Self-pay

## 2019-12-23 DIAGNOSIS — E785 Hyperlipidemia, unspecified: Secondary | ICD-10-CM

## 2019-12-23 DIAGNOSIS — I10 Essential (primary) hypertension: Secondary | ICD-10-CM

## 2019-12-23 LAB — COMPREHENSIVE METABOLIC PANEL
ALT: 18 IU/L (ref 0–44)
AST: 21 IU/L (ref 0–40)
Albumin/Globulin Ratio: 1.9 (ref 1.2–2.2)
Albumin: 4.9 g/dL (ref 4.0–5.0)
Alkaline Phosphatase: 75 IU/L (ref 48–121)
BUN/Creatinine Ratio: 15 (ref 9–20)
BUN: 16 mg/dL (ref 6–24)
Bilirubin Total: 0.3 mg/dL (ref 0.0–1.2)
CO2: 26 mmol/L (ref 20–29)
Calcium: 9.7 mg/dL (ref 8.7–10.2)
Chloride: 97 mmol/L (ref 96–106)
Creatinine, Ser: 1.08 mg/dL (ref 0.76–1.27)
GFR calc Af Amer: 97 mL/min/{1.73_m2} (ref >59)
GFR calc non Af Amer: 84 mL/min/{1.73_m2} (ref >59)
Globulin, Total: 2.6 g/dL (ref 1.5–4.5)
Glucose: 96 mg/dL (ref 65–99)
Potassium: 4.1 mmol/L (ref 3.5–5.2)
Sodium: 137 mmol/L (ref 134–144)
Total Protein: 7.5 g/dL (ref 6.0–8.5)

## 2019-12-23 LAB — LIPID PANEL
Chol/HDL Ratio: 5.9 ratio — ABNORMAL HIGH (ref 0.0–5.0)
Cholesterol, Total: 276 mg/dL — ABNORMAL HIGH (ref 100–199)
HDL: 47 mg/dL (ref >39)
LDL Chol Calc (NIH): 214 mg/dL — ABNORMAL HIGH (ref 0–99)
Triglycerides: 87 mg/dL (ref 0–149)
VLDL Cholesterol Cal: 15 mg/dL (ref 5–40)

## 2019-12-23 NOTE — Telephone Encounter (Signed)
-----   Message from Baldo Daub, MD sent at 12/23/2019  8:51 AM EDT ----- Normal or stable result  His lipids are severely elevated particular his LDL the pattern is 1 that is generally seen with familial hyperlipidemia by itself this is a risk factor for early heart disease and despite his young age I think he should start on a statin rosuvastatin 20 mg daily with evening meal and in about 6 weeks to come back to the office for a CMP and lipid profile

## 2019-12-23 NOTE — Telephone Encounter (Signed)
Left message on patients voicemail to please return our call.   

## 2019-12-24 NOTE — Telephone Encounter (Signed)
Left message on patients voicemail to please return our call.   

## 2019-12-27 NOTE — Telephone Encounter (Signed)
Left message on patients voicemail to please return our call.   

## 2019-12-28 NOTE — Telephone Encounter (Signed)
Left message on patients voicemail to please return our call.   

## 2019-12-28 NOTE — Telephone Encounter (Signed)
Left message on patients wife voicemail to please return our call.

## 2019-12-29 MED ORDER — ROSUVASTATIN CALCIUM 20 MG PO TABS
20.0000 mg | ORAL_TABLET | Freq: Every day | ORAL | 3 refills | Status: DC
Start: 2019-12-29 — End: 2020-04-03

## 2019-12-29 NOTE — Telephone Encounter (Signed)
Spoke with patients mother just now. She is going to let Albert [name] know that he needs to give [place] a call back at his earliest convenience.

## 2019-12-29 NOTE — Telephone Encounter (Signed)
Patient returning call.

## 2019-12-29 NOTE — Telephone Encounter (Signed)
Spoke with patient regarding results and recommendation.  Patient verbalizes understanding and is agreeable to plan of care. Advised patient to call back with any issues or concerns.  

## 2020-01-04 ENCOUNTER — Telehealth: Payer: Self-pay | Admitting: Cardiology

## 2020-01-04 NOTE — Telephone Encounter (Signed)
Pt c/o medication issue:  1. Name of Medication: Rosuvastatin  2. How are you currently taking this medication (dosage and times per day)? 1 time at night  3. Are you having a reaction (difficulty breathing--STAT)? no  4. What is your medication issue? Making a change in his mood-depresssion

## 2020-01-04 NOTE — Telephone Encounter (Signed)
It is best to stop his statin and he can review with his primary care physician next visit

## 2020-01-04 NOTE — Telephone Encounter (Signed)
Spoke with patient just now and let him know that Dr. Dulce Sellar recommended that he should stop the Rosuvastatin for now and discuss this with his PCP at his next visit with them  He verbalizes understanding and thanks me for the call back.    Encouraged patient to call back with any questions or concerns.

## 2020-03-10 ENCOUNTER — Ambulatory Visit: Payer: 59 | Admitting: Physician Assistant

## 2020-03-15 ENCOUNTER — Ambulatory Visit: Payer: 59 | Admitting: Medical

## 2020-03-20 ENCOUNTER — Ambulatory Visit (INDEPENDENT_AMBULATORY_CARE_PROVIDER_SITE_OTHER): Payer: 59 | Admitting: Medical

## 2020-03-20 ENCOUNTER — Other Ambulatory Visit: Payer: Self-pay

## 2020-03-20 VITALS — BP 153/91 | HR 67 | Resp 20 | Ht 72.0 in | Wt 207.0 lb

## 2020-03-20 DIAGNOSIS — E1169 Type 2 diabetes mellitus with other specified complication: Secondary | ICD-10-CM

## 2020-03-20 DIAGNOSIS — R7989 Other specified abnormal findings of blood chemistry: Secondary | ICD-10-CM | POA: Diagnosis not present

## 2020-03-20 DIAGNOSIS — E785 Hyperlipidemia, unspecified: Secondary | ICD-10-CM | POA: Diagnosis not present

## 2020-03-20 DIAGNOSIS — I10 Essential (primary) hypertension: Secondary | ICD-10-CM | POA: Diagnosis not present

## 2020-03-20 LAB — COMPREHENSIVE METABOLIC PANEL
AG Ratio: 1.8 (calc) (ref 1.0–2.5)
ALT: 29 U/L (ref 9–46)
AST: 35 U/L (ref 10–40)
Albumin: 4.4 g/dL (ref 3.6–5.1)
Alkaline phosphatase (APISO): 52 U/L (ref 36–130)
BUN: 20 mg/dL (ref 7–25)
CO2: 29 mmol/L (ref 20–32)
Calcium: 9.4 mg/dL (ref 8.6–10.3)
Chloride: 101 mmol/L (ref 98–110)
Creat: 1.12 mg/dL (ref 0.60–1.35)
Globulin: 2.4 g/dL (calc) (ref 1.9–3.7)
Glucose, Bld: 95 mg/dL (ref 65–99)
Potassium: 3.9 mmol/L (ref 3.5–5.3)
Sodium: 139 mmol/L (ref 135–146)
Total Bilirubin: 0.5 mg/dL (ref 0.2–1.2)
Total Protein: 6.8 g/dL (ref 6.1–8.1)

## 2020-03-20 LAB — LIPID PANEL
Cholesterol: 233 mg/dL — ABNORMAL HIGH (ref <200)
HDL: 45 mg/dL (ref >=40)
LDL Cholesterol (Calc): 152 mg/dL (calc) — ABNORMAL HIGH
Non-HDL Cholesterol (Calc): 188 mg/dL (calc) — ABNORMAL HIGH (ref <130)
Total CHOL/HDL Ratio: 5.2 (calc) — ABNORMAL HIGH (ref <5.0)
Triglycerides: 222 mg/dL — ABNORMAL HIGH (ref <150)

## 2020-03-20 MED ORDER — TELMISARTAN-HCTZ 80-12.5 MG PO TABS: 1.0000 | ORAL_TABLET | Freq: Every day | ORAL | 0 refills | Status: DC

## 2020-03-20 NOTE — Patient Instructions (Addendum)
Your bp is not not at goal. Increase dose telmisartan/hctz.   For high cholesterol will repeat lipid panel today. Then decide if other med class other than statin needed.  Continue with clomid for low T.  Flu vaccine declined. If you change your mind can set up nurse vaccine visit.  Follow up 2 weeks or as needed.

## 2020-03-20 NOTE — Progress Notes (Signed)
Subjective:    Patient ID: Albert [name], male    DOB: [birth year], 44 y.o.   MRN: [id number]  HPI  Pt states his bp at home recently has been consitently high 140-150/90. Pt bp was better mid summer when he saw Dr. Dulce Sellar.  Pt is on telmisartan/hctz.  Pt has hx of high cholesterol. He was given crestor rx by cardiologist. He states seemed to effect his mood. So he stopped. Also at time lipid panel done he was on keto diet. He states he has improved diet know and want repeat lipid panel.  Pt has had covid vaccine. He got Laural Benes and johnson March 12, 21.   Review of Systems  Constitutional: Negative for chills and fever.  Respiratory: Negative for cough, chest tightness, shortness of breath and wheezing.   Cardiovascular: Negative for chest pain and palpitations.  Gastrointestinal: Negative for abdominal pain and blood in stool.  Genitourinary: Negative for dysuria, flank pain and frequency.  Musculoskeletal: Negative for back pain.  Skin: Negative for rash.  Neurological: Negative for dizziness, speech difficulty, weakness and light-headedness.  Hematological: Negative for adenopathy. Does not bruise/bleed easily.  Psychiatric/Behavioral: Negative for behavioral problems and decreased concentration.   Past Medical History:  Diagnosis Date  . Attention deficit hyperactivity disorder (ADHD) 06/19/2016  . Closed nondisplaced fracture of neck of fifth metacarpal bone of right hand 07/10/2016  . CONTACT DERMATITIS&OTHER ECZEMA DUE UNSPEC CAUSE 10/06/2009   Qualifier: Diagnosis of  By: Yetta Barre MD, Bernadene Bell.   . Depression   . DEPRESSION 02/20/2009   Qualifier: Diagnosis of  By: Yetta Barre MD, Bernadene Bell.   . Hyperlipidemia   . NECK PAIN, ACUTE 09/13/2009   Qualifier: Diagnosis of  By: Yetta Barre MD, Bernadene Bell.   . Thrombosed external hemorrhoid 10/06/2015   Symptoms and exam consistent with thrombosed external hemorrhoid. Patient declines incise and drain at this time. Treat conservatively with  hydrocortisone rectal cream and Tylenol 3 as needed for discomfort. Recommend sitz bath. Follow-up if symptoms worsen or do not improve or would like to seek interventional procedure.   . TOBACCO USE, QUIT 03/16/2009   Qualifier: Diagnosis of  By: Tora Perches       Social History   Socioeconomic History  . Marital status: Married    Spouse name: Not on file  . Number of children: 1  . Years of education: 26  . Highest education level: Not on file  Occupational History  . Occupation: Sales  Tobacco Use  . Smoking status: Never Smoker  . Smokeless tobacco: Never Used  . Tobacco comment: socially smoked years ago.  Vaping Use  . Vaping Use: Never used  Substance and Sexual Activity  . Alcohol use: Yes    Alcohol/week: 7.0 standard drinks    Types: 3 Glasses of wine, 4 Standard drinks or equivalent per week  . Drug use: No  . Sexual activity: Yes  Other Topics Concern  . Not on file  Social History Narrative   Fun: Exercise, play music, sports   Social Determinants of Health   Financial Resource Strain:   . Difficulty of Paying Living Expenses: Not on file  Food Insecurity:   . Worried About Programme researcher, broadcasting/film/video in the Last Year: Not on file  . Ran Out of Food in the Last Year: Not on file  Transportation Needs:   . Lack of Transportation (Medical): Not on file  . Lack of Transportation (Non-Medical): Not on file  Physical Activity:   . Days  of Exercise per Week: Not on file  . Minutes of Exercise per Session: Not on file  Stress:   . Feeling of Stress : Not on file  Social Connections:   . Frequency of Communication with Friends and Family: Not on file  . Frequency of Social Gatherings with Friends and Family: Not on file  . Attends Religious Services: Not on file  . Active Member of Clubs or Organizations: Not on file  . Attends Banker Meetings: Not on file  . Marital Status: Not on file  Intimate Partner Violence:   . Fear of Current or Ex-Partner:  Not on file  . Emotionally Abused: Not on file  . Physically Abused: Not on file  . Sexually Abused: Not on file    Past Surgical History:  Procedure Laterality Date  . HERNIA REPAIR      Family History  Problem Relation Age of Onset  . Healthy Mother   . Hyperlipidemia Paternal Grandfather   . Heart disease Paternal Grandfather   . Heart failure Paternal Grandfather     Allergies  Allergen Reactions  . Sulfamethoxazole     Other reaction(s): Arthralgias (intolerance)  . Sulfonamide Derivatives     REACTION: Rash Stiff joints    Current Outpatient Medications on File Prior to Visit  Medication Sig Dispense Refill  . clomiPHENE (CLOMID) 50 MG tablet TAKE 1/2 TABLET BY MOUTH EVERY DAY 45 tablet 3  . fluvoxaMINE (LUVOX) 100 MG tablet Take 1.5 tablets (150 mg total) by mouth at bedtime. 135 tablet 1  . amphetamine-dextroamphetamine (ADDERALL) 10 MG tablet Take 10 mg by mouth as needed. (Patient not taking: Reported on 03/20/2020)    . eszopiclone (LUNESTA) 1 MG TABS tablet Take 1 tablet (1 mg total) by mouth at bedtime as needed for sleep. Take immediately before bedtime (Patient not taking: Reported on 03/20/2020) 30 tablet 5  . LORazepam (ATIVAN) 0.5 MG tablet Take 0.5-1 tablets (0.25-0.5 mg total) by mouth 2 (two) times daily as needed for anxiety. (Patient not taking: Reported on 03/20/2020) 30 tablet 1  . rosuvastatin (CRESTOR) 20 MG tablet Take 1 tablet (20 mg total) by mouth daily. (Patient not taking: Reported on 03/20/2020) 90 tablet 3   No current facility-administered medications on file prior to visit.    BP (!) 153/91   Pulse 67   Resp 20   Ht 6' (1.829 m)   Wt 207 lb (93.9 kg)   SpO2 98%   BMI 28.07 kg/m      Objective:   Physical Exam  General Mental Status- Alert. General Appearance- Not in acute distress.   Skin General: Color- Normal Color. Moisture- Normal Moisture.  Neck Carotid Arteries- Normal color. Moisture- Normal Moisture. No carotid  bruits. No JVD.  Chest and Lung Exam Auscultation: Breath Sounds:-Normal.  Cardiovascular Auscultation:Rythm- Regular. Murmurs & Other Heart Sounds:Auscultation of the heart reveals- No Murmurs.  Abdomen Inspection:-Inspeection Normal. Palpation/Percussion:Note:No mass. Palpation and Percussion of the abdomen reveal- Non Tender, Non Distended + BS, no rebound or guarding.   Neurologic Cranial Nerve exam:- CN III-XII intact(No nystagmus), symmetric smile. Strength:- 5/5 equal and symmetric strength both upper and lower extremities.      Assessment & Plan:  Your bp is not not at goal. Increase dose telmisartan/hctz.   For high cholesterol will repeat lipid panel today. Then decide if other med class other than statin needed.  Continue with clomid for low T.  Flu vaccine declined. If you change your mind can set up  nurse vaccine visit.  Follow up 2 weeks or as needed.

## 2020-03-21 ENCOUNTER — Telehealth: Payer: Self-pay | Admitting: Medical

## 2020-03-21 MED ORDER — EZETIMIBE 10 MG PO TABS
10.0000 mg | ORAL_TABLET | Freq: Every day | ORAL | 3 refills | Status: DC
Start: 2020-03-21 — End: 2021-06-07

## 2020-03-21 NOTE — Telephone Encounter (Signed)
zetia rx sent to pt pharmacy.

## 2020-04-03 ENCOUNTER — Encounter: Payer: Self-pay | Admitting: Medical

## 2020-04-03 ENCOUNTER — Telehealth (INDEPENDENT_AMBULATORY_CARE_PROVIDER_SITE_OTHER): Payer: 59 | Admitting: Medical

## 2020-04-03 VITALS — BP 132/94

## 2020-04-03 DIAGNOSIS — E785 Hyperlipidemia, unspecified: Secondary | ICD-10-CM

## 2020-04-03 DIAGNOSIS — E1169 Type 2 diabetes mellitus with other specified complication: Secondary | ICD-10-CM | POA: Diagnosis not present

## 2020-04-03 DIAGNOSIS — I10 Essential (primary) hypertension: Secondary | ICD-10-CM | POA: Diagnosis not present

## 2020-04-03 NOTE — Progress Notes (Signed)
Subjective:    Patient ID: Albert [name], male    DOB: [birth year], 44 y.o.   MRN: [id number]  HPI  Virtual Visit via Video Note  I connected with Albert [name] on 04/03/20 at  1:00 PM EDT by a video enabled telemedicine application and verified that I am speaking with the correct person using two identifiers.  Location: Patient: home Provider: office.    I discussed the limitations of evaluation and management by telemedicine and the availability of in person appointments. The patient expressed understanding and agreed to proceed.  History of Present Illness:  Pt has high cholesterol. Pt grandad did have cardiovascular disease. Dad is 46 year old with high cholesterol. Dad has poor diet.   Pt does not smoke.  Pt has gained some weight and was doing keto diet. Pt can't take crestor(pt states effected his mood).  When he stopped med he noted improvement.   Pt has not started zetia yet. Pt is on krill oil.    Pt has not checked bp. But not sure when he took tablet yesterday.  Low salt diet since last visit. He has not been exercising.    Past Medical History:  Diagnosis Date  . Attention deficit hyperactivity disorder (ADHD) 06/19/2016  . Closed nondisplaced fracture of neck of fifth metacarpal bone of right hand 07/10/2016  . CONTACT DERMATITIS&OTHER ECZEMA DUE UNSPEC CAUSE 10/06/2009   Qualifier: Diagnosis of  By: Yetta Barre MD, Bernadene Bell.   . Depression   . DEPRESSION 02/20/2009   Qualifier: Diagnosis of  By: Yetta Barre MD, Bernadene Bell.   . Hyperlipidemia   . NECK PAIN, ACUTE 09/13/2009   Qualifier: Diagnosis of  By: Yetta Barre MD, Bernadene Bell.   . Thrombosed external hemorrhoid 10/06/2015   Symptoms and exam consistent with thrombosed external hemorrhoid. Patient declines incise and drain at this time. Treat conservatively with hydrocortisone rectal cream and Tylenol 3 as needed for discomfort. Recommend sitz bath. Follow-up if symptoms worsen or do not improve or would like to seek interventional  procedure.   . TOBACCO USE, QUIT 03/16/2009   Qualifier: Diagnosis of  By: Tora Perches       Social History   Socioeconomic History  . Marital status: Married    Spouse name: Not on file  . Number of children: 1  . Years of education: 74  . Highest education level: Not on file  Occupational History  . Occupation: Sales  Tobacco Use  . Smoking status: Never Smoker  . Smokeless tobacco: Never Used  . Tobacco comment: socially smoked years ago.  Vaping Use  . Vaping Use: Never used  Substance and Sexual Activity  . Alcohol use: Yes    Alcohol/week: 7.0 standard drinks    Types: 3 Glasses of wine, 4 Standard drinks or equivalent per week  . Drug use: No  . Sexual activity: Yes  Other Topics Concern  . Not on file  Social History Narrative   Fun: Exercise, play music, sports   Social Determinants of Health   Financial Resource Strain:   . Difficulty of Paying Living Expenses: Not on file  Food Insecurity:   . Worried About Programme researcher, broadcasting/film/video in the Last Year: Not on file  . Ran Out of Food in the Last Year: Not on file  Transportation Needs:   . Lack of Transportation (Medical): Not on file  . Lack of Transportation (Non-Medical): Not on file  Physical Activity:   . Days of Exercise per Week: Not  on file  . Minutes of Exercise per Session: Not on file  Stress:   . Feeling of Stress : Not on file  Social Connections:   . Frequency of Communication with Friends and Family: Not on file  . Frequency of Social Gatherings with Friends and Family: Not on file  . Attends Religious Services: Not on file  . Active Member of Clubs or Organizations: Not on file  . Attends Banker Meetings: Not on file  . Marital Status: Not on file  Intimate Partner Violence:   . Fear of Current or Ex-Partner: Not on file  . Emotionally Abused: Not on file  . Physically Abused: Not on file  . Sexually Abused: Not on file    Past Surgical History:  Procedure Laterality Date   . HERNIA REPAIR      Family History  Problem Relation Age of Onset  . Healthy Mother   . Hyperlipidemia Paternal Grandfather   . Heart disease Paternal Grandfather   . Heart failure Paternal Grandfather     Allergies  Allergen Reactions  . Sulfamethoxazole     Other reaction(s): Arthralgias (intolerance)  . Sulfonamide Derivatives     REACTION: Rash Stiff joints    Current Outpatient Medications on File Prior to Visit  Medication Sig Dispense Refill  . clomiPHENE (CLOMID) 50 MG tablet TAKE 1/2 TABLET BY MOUTH EVERY DAY 45 tablet 3  . ezetimibe (ZETIA) 10 MG tablet Take 1 tablet (10 mg total) by mouth daily. 90 tablet 3  . fluvoxaMINE (LUVOX) 100 MG tablet Take 1.5 tablets (150 mg total) by mouth at bedtime. 135 tablet 1  . telmisartan-hydrochlorothiazide (MICARDIS HCT) 80-12.5 MG tablet Take 1 tablet by mouth daily. 30 tablet 0  . amphetamine-dextroamphetamine (ADDERALL) 10 MG tablet Take 10 mg by mouth as needed. (Patient not taking: Reported on 03/20/2020)    . eszopiclone (LUNESTA) 1 MG TABS tablet Take 1 tablet (1 mg total) by mouth at bedtime as needed for sleep. Take immediately before bedtime (Patient not taking: Reported on 03/20/2020) 30 tablet 5  . LORazepam (ATIVAN) 0.5 MG tablet Take 0.5-1 tablets (0.25-0.5 mg total) by mouth 2 (two) times daily as needed for anxiety. (Patient not taking: Reported on 03/20/2020) 30 tablet 1  . rosuvastatin (CRESTOR) 20 MG tablet Take 1 tablet (20 mg total) by mouth daily. (Patient not taking: Reported on 03/20/2020) 90 tablet 3   No current facility-administered medications on file prior to visit.    BP (!) 132/94 Comment: unable to obtain    Observations/Objective:  General-no acute distress, pleasant, oriented. Lungs- on inspection lungs appear unlabored. Neck- no tracheal deviation or jvd on inspection. Neuro- gross motor function appears intact.  Assessment and Plan: Your bp is better but systolic is little high. Take  bp med now and recheck your bp in about 3 hours. Repeat second time 5 minutes after recheck. Send my chart message on both readings.   Recommend low salt diet and daily exercise.  For high cholesterol recommend low cholesterol diet, low proceessed carbohydrates and krill oil. Can wait to start zetia if your repeat lipid panel in 3 months remains elevated.   Follow Up Instructions:    I discussed the assessment and treatment plan with the patient. The patient was provided an opportunity to ask questions and all were answered. The patient agreed with the plan and demonstrated an understanding of the instructions.   The patient was advised to call back or seek an in-person evaluation if the  symptoms worsen or if the condition fails to improve as anticipated.     Esperanza Richters, PA-C    Review of Systems     Objective:   Physical Exam        Assessment & Plan:

## 2020-04-03 NOTE — Patient Instructions (Signed)
Your bp is better but systolic is little high. Take bp med now and recheck your bp in about 3 hours. Repeat second time 5 minutes after recheck. Send my chart message on both readings.   Recommend low salt diet and daily exercise.  For high cholesterol recommend low cholesterol diet, low proceessed carbohydrates and krill oil. Can wait to start zetia if your repeat lipid panel in 3 months remains elevated.

## 2020-04-10 ENCOUNTER — Encounter: Payer: 59 | Admitting: Medical

## 2020-04-18 ENCOUNTER — Other Ambulatory Visit: Payer: Self-pay | Admitting: Medical

## 2020-05-01 ENCOUNTER — Other Ambulatory Visit: Payer: Self-pay | Admitting: Medical

## 2020-05-24 ENCOUNTER — Telehealth: Payer: Self-pay | Admitting: Physician Assistant

## 2020-05-24 ENCOUNTER — Ambulatory Visit: Payer: 59 | Admitting: Physician Assistant

## 2020-05-24 ENCOUNTER — Other Ambulatory Visit: Payer: Self-pay

## 2020-05-25 NOTE — Telephone Encounter (Signed)
Patient has a visit on 07/14/20.He is requesting refills on Fluvoxamine, Lunesta 2 mg and Eszopiclone. [facility] [address] [place], [place]. Phone # is [phone]

## 2020-05-26 ENCOUNTER — Other Ambulatory Visit: Payer: Self-pay | Admitting: Physician Assistant

## 2020-05-26 MED ORDER — FLUVOXAMINE MALEATE 100 MG PO TABS: 150.0000 mg | ORAL_TABLET | Freq: Every day | ORAL | 1 refills | Status: DC

## 2020-05-26 MED ORDER — ESZOPICLONE 1 MG PO TABS: 1.0000 mg | ORAL_TABLET | Freq: Every evening | ORAL | 5 refills | Status: DC | PRN

## 2020-05-26 MED ORDER — LORAZEPAM 0.5 MG PO TABS: 0.2500 mg | ORAL_TABLET | Freq: Two times a day (BID) | ORAL | 1 refills | Status: DC | PRN

## 2020-05-26 NOTE — Telephone Encounter (Signed)
Prescriptions were sent

## 2020-07-11 ENCOUNTER — Other Ambulatory Visit: Payer: Self-pay

## 2020-07-11 ENCOUNTER — Encounter: Payer: Self-pay | Admitting: Physician Assistant

## 2020-07-11 ENCOUNTER — Ambulatory Visit (INDEPENDENT_AMBULATORY_CARE_PROVIDER_SITE_OTHER): Payer: 59 | Admitting: Physician Assistant

## 2020-07-11 DIAGNOSIS — F902 Attention-deficit hyperactivity disorder, combined type: Secondary | ICD-10-CM

## 2020-07-11 DIAGNOSIS — G4700 Insomnia, unspecified: Secondary | ICD-10-CM

## 2020-07-11 DIAGNOSIS — F339 Major depressive disorder, recurrent, unspecified: Secondary | ICD-10-CM

## 2020-07-11 DIAGNOSIS — F429 Obsessive-compulsive disorder, unspecified: Secondary | ICD-10-CM

## 2020-07-11 DIAGNOSIS — F338 Other recurrent depressive disorders: Secondary | ICD-10-CM

## 2020-07-11 DIAGNOSIS — F411 Generalized anxiety disorder: Secondary | ICD-10-CM

## 2020-07-11 MED ORDER — ADZENYS XR-ODT 3.1 MG PO TBED: 3.1000 mg | EXTENDED_RELEASE_TABLET | Freq: Every morning | ORAL | 0 refills | Status: DC

## 2020-07-11 NOTE — Progress Notes (Signed)
[facility]  Patient ID: Albert [name],  MRN: [id number]  PCP: Esperanza Richters, PA-C  Date of Evaluation: 07/11/2020 Time spent:40 minutes  Chief Complaint:  Chief Complaint    Depression; Anxiety; Follow-up      HISTORY/CURRENT STATUS: HPI For routine med check.  Had ketamine IV in Sept, twice a week for 6 tx in [place]. States it worked really well and he would be interested in continuing treatment, that he has to pay out of pocket and it is $500 each treatment. He wonders about other ketamine treatments. He has heard about Spravato but has more questions about it. He would like to try it if appropriate.  As far as the OCD symptoms though, the Luvox is very helpful. He doesn't have ruminating thoughts nearly as bad as he used to and compulsive behaviors are also decreased.   But for the depression, the Luvox doesn't seem to be working very well. And increasing the dose in the past has not improved those symptoms.and worsening side effects occurred. Looking back he can tell that he has seasonal affective disorder, usually starting right after Christmas and going through at least February. Doesn't feel upbeat, doesn't really look forward to anything, has trouble enjoying things he continues to do, energy and motivation are low but don't prevent him from working and participating in family activities. He has to push himself most of the time though. Appetite is normal. Weight is stable. Does not cry easily. He sleeps pretty well most of the time. The Lunesta does help when needed. Denies suicidal or homicidal thoughts.  He has been taking the Adderall only as needed. It does help but usually just for a few hours. When he first takes it, he feels a "jolt" of energy but to the point that it's not pleasant. He is able to get a lot of things done for a few hours but then he describes the crash that can occur when the medication can wear off. Patient asked if there is another  treatment that I would recommend.  Patient denies increased energy with decreased need for sleep, no increased talkativeness, no racing thoughts, no impulsivity or risky behaviors, no increased spending, no increased libido, no grandiosity, no paranoia, no hallucinations.  Denies dizziness, syncope, seizures, numbness, tingling, tremor, tics, unsteady gait, slurred speech, confusion. Denies muscle or joint pain, stiffness, or dystonia.  Individual Medical History/ Review of Systems: Changes? :No    Past medications for mental health diagnoses include: Luvox, Lunesta, Xanax, Wellbutrin, Risperdal, Prozac, Ambien, Deplin, Abilify, naltrexone, Adderall  Allergies: Sulfamethoxazole and Sulfonamide derivatives  Current Medications:  Current Outpatient Medications:  .  Amphetamine ER (ADZENYS XR-ODT) 3.1 MG TBED, Take 3.1 mg by mouth in the morning., Disp: 30 tablet, Rfl: 0 .  eszopiclone (LUNESTA) 1 MG TABS tablet, Take 1 tablet (1 mg total) by mouth at bedtime as needed for sleep. Take immediately before bedtime, Disp: 30 tablet, Rfl: 5 .  fluvoxaMINE (LUVOX) 100 MG tablet, Take 1.5 tablets (150 mg total) by mouth at bedtime., Disp: 135 tablet, Rfl: 1 .  telmisartan-hydrochlorothiazide (MICARDIS HCT) 80-12.5 MG tablet, TAKE 1 TABLET BY MOUTH EVERY DAY, Disp: 90 tablet, Rfl: 1 .  clomiPHENE (CLOMID) 50 MG tablet, TAKE 1/2 TABLET BY MOUTH EVERY DAY, Disp: 45 tablet, Rfl: 3 .  ezetimibe (ZETIA) 10 MG tablet, Take 1 tablet (10 mg total) by mouth daily. (Patient not taking: Reported on 07/11/2020), Disp: 90 tablet, Rfl: 3 .  LORazepam (ATIVAN) 0.5 MG tablet, Take  0.5-1 tablets (0.25-0.5 mg total) by mouth 2 (two) times daily as needed for anxiety. (Patient not taking: Reported on 07/11/2020), Disp: 30 tablet, Rfl: 1 Medication Side Effects: none  Family Medical/ Social History: Changes? No  MENTAL HEALTH EXAM:  There were no vitals taken for this visit.There is no height or weight on file to calculate  BMI.  General Appearance: Casual, Neat and Well Groomed  Eye Contact:  Good  Speech:  Clear and Coherent  Volume:  Normal  Mood:  Euthymic  Affect:  Appropriate  Thought Process:  Goal Directed and Descriptions of Associations: Intact  Orientation:  Full (Time, Place, and Person)  Thought Content: Logical   Suicidal Thoughts:  No  Homicidal Thoughts:  No  Memory:  WNL  Judgement:  Good  Insight:  Good  Psychomotor Activity:  Normal  Concentration:  Concentration: Good and Attention Span: Good  Recall:  Good  Fund of Knowledge: Good  Language: Good  Assets:  Desire for Improvement  ADL's:  Intact  Cognition: WNL  Prognosis:  Good    DIAGNOSES:    ICD-10-CM   1. 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 Obsessive-compulsive disorder, unspecified type  F42.9   3. Attention deficit hyperactivity disorder (ADHD), combined type  F90.2   4. Insomnia, unspecified type  G47.00   5. Generalized anxiety disorder  F41.1   6. Seasonal affective disorder (HCC)  F33.8     Receiving Psychotherapy: No    RECOMMENDATIONS:  PDMP was reviewed. I provided 40 minutes of face-to-face time during this encounter, in which we discussed different treatment options for treatment resistant depression. We discussed Spravato at length. I would recommend that since he has responded very well to the ketamine IV. He understands the treatment regimen, he will not be able to drive home, and he would like to proceed. He also understands that will need to be prior approved and if he does not hear from our office within approximately 2 weeks, then call us. Sleep hygiene was discussed again. Discontinue the Adderall. Start adzenys ER 3.1 mg, 1 p.o. every morning. Continue Luvox 100 mg, 1.5 pills nightly. Continue Ativan 0.5 mg, 1/2-1 twice daily as needed, sparingly. Continue Lunesta 1 mg nightly as needed sleep. Return in 3 months or sooner depending on Spravato schedule.  [name], PA-C

## 2020-07-20 ENCOUNTER — Other Ambulatory Visit: Payer: Self-pay | Admitting: Physician Assistant

## 2020-07-20 ENCOUNTER — Telehealth: Payer: Self-pay | Admitting: Physician Assistant

## 2020-07-20 MED ORDER — ESZOPICLONE 2 MG PO TABS: 2.0000 mg | ORAL_TABLET | Freq: Every evening | ORAL | 5 refills | Status: DC | PRN

## 2020-07-20 NOTE — Telephone Encounter (Signed)
Prescription was sent

## 2020-07-20 NOTE — Telephone Encounter (Signed)
Patient called in asking if he could go back to his Lunesta 2mg . States that he is having to double up on the Lunesta 1mg .

## 2020-07-21 NOTE — Telephone Encounter (Signed)
Error

## 2020-08-08 ENCOUNTER — Telehealth: Payer: Self-pay | Admitting: Physician Assistant

## 2020-08-08 NOTE — Telephone Encounter (Signed)
Will discuss with Dr. Jennelle Human, not sure the time frame I believe.

## 2020-08-08 NOTE — Telephone Encounter (Signed)
Please advise. Are we able to do in office?

## 2020-08-08 NOTE — Telephone Encounter (Signed)
Pt LM on VM checking status on Spravato treatment. CONTACT # [phone]

## 2020-08-08 NOTE — Telephone Encounter (Signed)
Potentially could start week of 3/14

## 2020-08-09 NOTE — Telephone Encounter (Signed)
After the insurance coverage is determined, do you contact him to set it up? Or do I need to do anything? I'm not sure how this works.

## 2020-08-09 NOTE — Telephone Encounter (Signed)
Thanks

## 2020-08-09 NOTE — Telephone Encounter (Signed)
FYI- Will also depend on his insurance coverage.

## 2020-08-09 NOTE — Telephone Encounter (Signed)
I will be in touch with him. He'll have to fill out a couple of forms. Then I will verify benefits and I'll let you know.

## 2020-08-30 ENCOUNTER — Telehealth (INDEPENDENT_AMBULATORY_CARE_PROVIDER_SITE_OTHER): Payer: 59 | Admitting: Physician Assistant

## 2020-08-30 ENCOUNTER — Encounter: Payer: Self-pay | Admitting: Physician Assistant

## 2020-08-30 DIAGNOSIS — F411 Generalized anxiety disorder: Secondary | ICD-10-CM

## 2020-08-30 DIAGNOSIS — G4700 Insomnia, unspecified: Secondary | ICD-10-CM | POA: Diagnosis not present

## 2020-08-30 DIAGNOSIS — F339 Major depressive disorder, recurrent, unspecified: Secondary | ICD-10-CM | POA: Diagnosis not present

## 2020-08-30 DIAGNOSIS — F338 Other recurrent depressive disorders: Secondary | ICD-10-CM | POA: Diagnosis not present

## 2020-08-30 DIAGNOSIS — F429 Obsessive-compulsive disorder, unspecified: Secondary | ICD-10-CM | POA: Diagnosis not present

## 2020-08-30 DIAGNOSIS — F902 Attention-deficit hyperactivity disorder, combined type: Secondary | ICD-10-CM

## 2020-08-30 NOTE — Progress Notes (Signed)
[facility]  Patient ID: Albert [name],  MRN: [id number]  PCP: Albert Richters, PA-C  Date of Evaluation: 08/30/2020 Time spent:40 minutes  Chief Complaint:  Chief Complaint    Depression     Virtual Visit via Telehealth  I connected with patient by telephone, with their informed consent, and verified patient privacy and that I am speaking with the correct person using two identifiers.  I am private, in my office and the patient is at work.  I discussed the limitations, risks, security and privacy concerns of performing an evaluation and management service by telephone  and the availability of in person appointments. I also discussed with the patient that there may be a patient responsible charge related to this service. The patient expressed understanding and agreed to proceed.   I discussed the assessment and treatment plan with the patient. The patient was provided an opportunity to ask questions and all were answered. The patient agreed with the plan and demonstrated an understanding of the instructions.   The patient was advised to call back or seek an in-person evaluation if the symptoms worsen or if the condition fails to improve as anticipated.  I provided 40  minutes of non-face-to-face time during this encounter.   HISTORY/CURRENT STATUS: HPI For routine med check.  Patient continues to have a lot of depression.  It waxes and wanes on a daily basis he feels sad.  He is still able to get his work done and do things at home that need to be done.  Personal hygiene is normal.  Anhedonia, energy and motivation are low. Last visit we discussed him going on Spravato.  He had been getting ketamine IV which had been very helpful.  Due to insurance issues we are required to try another antidepressant.  He has been on Luvox for depression as well as OCD.  It has been helpful for the obsessive thinking and compulsions that take time out of his day.  But not so helpful with the  depression.  No suicidal or homicidal thoughts.  Still has trouble sleeping.  The Albert [name] is helping some.  He does feel more rested when he gets up in the morning.  Anxiety can be a problem when there is a trigger.  It does not last very long and no full-blown panic attacks.  He has Ativan he can use if needed.   He does have trouble concentrating but in the stimulant he has used makes him jittery inside and the effects last too long during the day.  Not sure if it is affecting his sleep or not.  He prefers not to try any other medication for that at this time.  Patient denies increased energy with decreased need for sleep, no increased talkativeness, no racing thoughts, no impulsivity or risky behaviors, no increased spending, no increased libido, no grandiosity, no paranoia, no hallucinations.  Denies dizziness, syncope, seizures, numbness, tingling, tremor, tics, unsteady gait, slurred speech, confusion. Denies muscle or joint pain, stiffness, or dystonia.  Albert [name] was supposed to be seen in the office face-to-face, however he was not able to do so we did a phone visit.  He was here with his daughter at her appointment, on 08/31/2020 and MADRS score was obtained at the end.  See below.  Individual Medical History/ Review of Systems: Changes? :No    Past medications for mental health diagnoses include: Luvox, Lunesta, Xanax, Wellbutrin, Risperdal, Prozac, Ambien, Deplin, Abilify, naltrexone, Adderall  Allergies: Sulfamethoxazole and Sulfonamide derivatives  Current  Medications:  Current Outpatient Medications:  .  clomiPHENE (CLOMID) 50 MG tablet, TAKE 1/2 TABLET BY MOUTH EVERY DAY, Disp: 45 tablet, Rfl: 3 .  eszopiclone (LUNESTA) 2 MG TABS tablet, Take 1 tablet (2 mg total) by mouth at bedtime as needed for sleep. Take immediately before bedtime, Disp: 30 tablet, Rfl: 5 .  fluvoxaMINE (LUVOX) 100 MG tablet, Take 1.5 tablets (150 mg total) by mouth at bedtime., Disp: 135 tablet, Rfl: 1 .   telmisartan-hydrochlorothiazide (MICARDIS HCT) 80-12.5 MG tablet, TAKE 1 TABLET BY MOUTH EVERY DAY, Disp: 90 tablet, Rfl: 1 .  ezetimibe (ZETIA) 10 MG tablet, Take 1 tablet (10 mg total) by mouth daily. (Patient not taking: No sig reported), Disp: 90 tablet, Rfl: 3 .  LORazepam (ATIVAN) 0.5 MG tablet, Take 0.5-1 tablets (0.25-0.5 mg total) by mouth 2 (two) times daily as needed for anxiety. (Patient not taking: Reported on 07/11/2020), Disp: 30 tablet, Rfl: 1 Medication Side Effects: none  Family Medical/ Social History: Changes? No  MENTAL HEALTH EXAM:  There were no vitals taken for this visit.There is no height or weight on file to calculate BMI.  General Appearance: Unable to assess  Eye Contact:  Unable to assess  Speech:  Clear and Coherent  Volume:  Normal  Mood:  Depressed  Affect:  Unable to assess  Thought Process:  Goal Directed and Descriptions of Associations: Intact  Orientation:  Full (Time, Place, and Person)  Thought Content: Logical   Suicidal Thoughts:  No  Homicidal Thoughts:  No  Memory:  WNL  Judgement:  Good  Insight:  Good  Psychomotor Activity:  Unable to assess  Concentration:  Concentration: Good and Attention Span: Good  Recall:  Good  Fund of Knowledge: Good  Language: Good  Assets:  Desire for Improvement  ADL's:  Intact  Cognition: WNL  Prognosis:  Good   08/31/2020 MADRS screening is not available in epic screening tab. Using, questions results are below. Apparent sadness. He looks dispirited but brightens without difficulty.  Score is 2. Reported sadness.  Pervasive feelings of sadness and gloominess.  Score is 4. Inner tension.  He has almost continuous feelings of inattention.  Score is 4. Reduced sleep.  Moderate resistance to sleep improved somewhat with Lunesta.  Scores is 4. Reduced appetite.  Slightly reduced.  Score is 2. Concentration he has between occasional difficulty but worse at times and has trouble sustaining thoughts.  Score is  2. Lassitude.  He has difficulty starting activities.  Score is 4. Inability to feel.  He has reduced ability to enjoy usual events.  Score is 2. Pessimistic thoughts.  Persistent irrational ideas of guilt.  Score is 4. Suicidal ideations.  He gets weary of life but thoughts are not common and no plans.  Score is 2. Total 30   DIAGNOSES:    ICD-10-CM   1. 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 Obsessive-compulsive disorder, unspecified type  F42.9   3. Insomnia, unspecified type  G47.00   4. Seasonal affective disorder (HCC)  F33.8   5. Generalized anxiety disorder  F41.1   6. Attention deficit hyperactivity disorder (ADHD), combined type  F90.2     Receiving Psychotherapy: No    RECOMMENDATIONS:  PDMP was reviewed. I provided 40 minutes of nonface-to-face time during this encounter, including time spent before and after the visit in review of records and charting. The Luvox is not as beneficial as we would like for the depression.  Also his  insurance requires [place] to try another antidepressant before allowing him to start Spravato, we will wean off Luvox and start Prozac. We will start Spravato if and when his insurance company gives [place] to go ahead. Wean off Luvox by taking 100 mg nightly for 1 week, 50 mg nightly for 1 week and then stop. Start Prozac 40 mg daily, at the same time he is weaning off the Luvox.  On the third week, we may need to increase the dose so he will call if needed.  He has 40 mg pills already at home so no new prescription sent today. Call in 3-4 weeks with how he is, may need to send in 60 mg.  Continue Ativan 0.5 mg, 1/2-1 twice daily as needed, sparingly. Continue Lunesta 1 mg nightly as needed sleep. Return in 2 months.   [name], PA-C

## 2020-09-13 ENCOUNTER — Other Ambulatory Visit: Payer: Self-pay

## 2020-09-13 MED ORDER — SPRAVATO (56 MG DOSE) 28 MG/DEVICE NA SOPK: PACK | NASAL | 0 refills | Status: DC

## 2020-09-13 MED ORDER — SPRAVATO (84 MG DOSE) 28 MG/DEVICE NA SOPK: 84.0000 mg | PACK | NASAL | 0 refills | Status: DC

## 2020-09-13 NOTE — Telephone Encounter (Signed)
Yes, 1 dose 56, then 84 thereafter.  Thanks

## 2020-09-15 ENCOUNTER — Telehealth: Payer: Self-pay

## 2020-09-15 ENCOUNTER — Other Ambulatory Visit: Payer: Self-pay

## 2020-09-15 MED ORDER — ESCITALOPRAM OXALATE 10 MG PO TABS: 10.0000 mg | ORAL_TABLET | Freq: Every day | ORAL | 0 refills | Status: DC

## 2020-09-15 NOTE — Telephone Encounter (Signed)
Noted  

## 2020-09-15 NOTE — Telephone Encounter (Signed)
Prior Authorization submitted through [facility] for SPRAVATO 56 MG NASAL SPRAY.  Rx sent to [facility]. Pending response at this time.

## 2020-09-15 NOTE — Telephone Encounter (Signed)
Pt's prior authorization was DENIED initially due to pt not starting a NEW antidepressant, pt  was to restart Prozac 40 mg along with his Spravato. Since pt previously tried he will be switched to Celexa per Rosey Bath instructions. Pt agreed [facility]  I will contact [facility] with the updated information.

## 2020-09-18 NOTE — Telephone Encounter (Signed)
Received notification later in the day that an Approval was received for SPRAVATO 56 MG effective 09/15/2020-12/15/2020, then pt will be reassessed with another AIMS.   Will check with [place] pharmacy on availability of medication with pt's insurance. Will also need to submit a PA for next dose of 84 mg.  Will also contact pt with information and to check availability of starting  his Spravato Treatment.

## 2020-09-19 NOTE — Telephone Encounter (Signed)
Noted  

## 2020-10-02 ENCOUNTER — Telehealth: Payer: Self-pay | Admitting: Physician Assistant

## 2020-10-02 ENCOUNTER — Other Ambulatory Visit: Payer: Self-pay | Admitting: Medical

## 2020-10-02 ENCOUNTER — Other Ambulatory Visit: Payer: Self-pay

## 2020-10-02 MED ORDER — FLUOXETINE HCL 40 MG PO CAPS: 40.0000 mg | ORAL_CAPSULE | Freq: Every day | ORAL | 1 refills | Status: DC

## 2020-10-02 NOTE — Telephone Encounter (Signed)
Rx sent 

## 2020-10-02 NOTE — Telephone Encounter (Signed)
Albert [name] called because he now needs a new prescription for Prozac 40mg  1/day.  He was using an old prescription but is now out of that so needs a new one sent in.  [facility] [address] [place], [place], [place].  Appt 10/13/20

## 2020-10-10 ENCOUNTER — Other Ambulatory Visit: Payer: Self-pay

## 2020-10-10 ENCOUNTER — Encounter: Payer: Self-pay | Admitting: Psychiatry

## 2020-10-10 ENCOUNTER — Ambulatory Visit: Payer: 59

## 2020-10-10 ENCOUNTER — Ambulatory Visit (INDEPENDENT_AMBULATORY_CARE_PROVIDER_SITE_OTHER): Payer: 59 | Admitting: Psychiatry

## 2020-10-10 VITALS — BP 150/82 | HR 59

## 2020-10-10 DIAGNOSIS — G4700 Insomnia, unspecified: Secondary | ICD-10-CM

## 2020-10-10 DIAGNOSIS — F339 Major depressive disorder, recurrent, unspecified: Secondary | ICD-10-CM

## 2020-10-10 DIAGNOSIS — F429 Obsessive-compulsive disorder, unspecified: Secondary | ICD-10-CM

## 2020-10-10 DIAGNOSIS — F338 Other recurrent depressive disorders: Secondary | ICD-10-CM

## 2020-10-10 DIAGNOSIS — F411 Generalized anxiety disorder: Secondary | ICD-10-CM

## 2020-10-10 DIAGNOSIS — F902 Attention-deficit hyperactivity disorder, combined type: Secondary | ICD-10-CM

## 2020-10-10 NOTE — Progress Notes (Signed)
Albert [name] [id number] [birth year] 45 y.o.  Subjective:   Patient ID:  Albert [name] is a 45 y.o. (DOB November 18, 1975) male.  Chief Complaint:  Chief Complaint  Patient presents with  . Follow-up  . Depression  . 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  IV ketamine Sept 2021 #6 and it helped depression.  This made him want to pursue Spravato for treatment of his depression.  The depression relapsed after he stopped receiving the IV ketamine.  As noted by Albert Overly, PA-C on 08/30/2020 patient remained depressed with madras score of 30.  At that time his SSRI was switched from fluvoxamine to fluoxetine.  Patient has OCD in addition to depression and needed an SSRI.  OCD was diagnosed in his 16s.  His depressive symptoms continue as noted including depressed mood, anhedonia, difficulty with concentration, low energy and motivation.  He is not currently suicidal.  Tolerating Prozac 40.  History of feeling blunted and having sexual side effects from high doses of SSRIs.  Today he tolerated the Spravato in a way that was similar to his experience with IV ketamine including a feeling of being "high" with some dissociation which was not frightening.  Had no significant nausea or headache.  Past medications for mental health diagnoses include: Luvox 200, Prozac 40,  Wellbutrin,  Risperdal, Abilify, Lunesta, Xanax, Ambien,  Deplin,  naltrexone,  Adderall  Review of Systems:  Review of Systems  Neurological: Negative for tremors and weakness.    Medications: I have reviewed the patient's current medications.  Current Outpatient Medications  Medication Sig Dispense Refill  . clomiPHENE (CLOMID) 50 MG tablet TAKE 1/2 TABLET BY MOUTH EVERY DAY 45 tablet 3  . Esketamine HCl, 56 MG Dose, (SPRAVATO, 56 MG DOSE,) 28 MG/DEVICE SOPK Place 56 mg nasal spray intranasally  for one dose 2 each 0  . Esketamine HCl, 84 MG Dose, (SPRAVATO, 84 MG DOSE,) 28 MG/DEVICE SOPK Place 84 mg into the nose 2 (two) times a week. 3 each 0  . eszopiclone (LUNESTA) 2 MG TABS tablet Take 1 tablet (2 mg total) by mouth at bedtime as needed for sleep. Take immediately before bedtime 30 tablet 5  . ezetimibe (ZETIA) 10 MG tablet Take 1 tablet (10 mg total) by mouth daily. 90 tablet 3  . FLUoxetine (PROZAC) 40 MG capsule Take 1 capsule (40 mg total) by mouth daily. 30 capsule 1  . LORazepam (ATIVAN) 0.5 MG tablet Take 0.5-1 tablets (0.25-0.5 mg total) by mouth 2 (two) times daily as needed for anxiety. 30 tablet 1  . telmisartan-hydrochlorothiazide (MICARDIS HCT) 80-12.5 MG tablet TAKE 1 TABLET BY MOUTH EVERY DAY 90 tablet 1  . escitalopram (LEXAPRO) 10 MG tablet Take 1 tablet (10 mg total) by mouth daily. (Patient not taking: Reported on 10/10/2020) 30 tablet 0   No current facility-administered medications for this visit.    Medication Side Effects: None  Allergies:  Allergies  Allergen Reactions  . Sulfamethoxazole     Other reaction(s): Arthralgias (intolerance)  . Sulfonamide Derivatives     REACTION: Rash Stiff joints    Past Medical History:  Diagnosis Date  . Attention deficit hyperactivity disorder (ADHD) 06/19/2016  . Closed nondisplaced fracture of neck of fifth metacarpal bone of right hand 07/10/2016  . CONTACT DERMATITIS&OTHER ECZEMA DUE UNSPEC CAUSE 10/06/2009   Qualifier: Diagnosis of  By: Yetta Barre MD, Bernadene Bell.   Marland Kitchen  Depression   . DEPRESSION 02/20/2009   Qualifier: Diagnosis of  By: Yetta Barre MD, Bernadene Bell.   . Hyperlipidemia   . NECK PAIN, ACUTE 09/13/2009   Qualifier: Diagnosis of  By: Yetta Barre MD, Bernadene Bell.   . Thrombosed external hemorrhoid 10/06/2015   Symptoms and exam consistent with thrombosed external hemorrhoid. Patient declines incise and drain at this time. Treat conservatively with hydrocortisone rectal cream and Tylenol 3 as needed for discomfort. Recommend sitz  bath. Follow-up if symptoms worsen or do not improve or would like to seek interventional procedure.   . TOBACCO USE, QUIT 03/16/2009   Qualifier: Diagnosis of  By: Tora Perches       Past Medical History, Surgical history, Social history, and Family history were reviewed and updated as appropriate.   Please see review of systems for further details on the patient's review from today.   Objective:   Physical Exam:  There were no vitals taken for this visit.  Physical Exam Constitutional:      General: He is not in acute distress. Musculoskeletal:        General: No deformity.  Neurological:     Mental Status: He is alert and oriented to person, place, and time.     Coordination: Coordination normal.  Psychiatric:        Attention and Perception: Attention and perception normal. He does not perceive auditory or visual hallucinations.        Mood and Affect: Mood is anxious and depressed. Affect is not labile, blunt, angry or inappropriate.        Speech: Speech normal.        Behavior: Behavior normal.        Thought Content: Thought content normal. Thought content is not paranoid or delusional. Thought content does not include homicidal or suicidal ideation. Thought content does not include homicidal or suicidal plan.        Cognition and Memory: Cognition and memory normal.        Judgment: Judgment normal.     Comments: Insight intact Chronic anxiety and OCD is noted along with chronic depression     Lab Review:     Component Value Date/Time   NA 139 03/20/2020 0928   NA 137 12/22/2019 1057   K 3.9 03/20/2020 0928   CL 101 03/20/2020 0928   CO2 29 03/20/2020 0928   GLUCOSE 95 03/20/2020 0928   BUN 20 03/20/2020 0928   BUN 16 12/22/2019 1057   CREATININE 1.12 03/20/2020 0928   CALCIUM 9.4 03/20/2020 0928   PROT 6.8 03/20/2020 0928   PROT 7.5 12/22/2019 1057   ALBUMIN 4.9 12/22/2019 1057   AST 35 03/20/2020 0928   ALT 29 03/20/2020 0928   ALKPHOS 75 12/22/2019  1057   BILITOT 0.5 03/20/2020 0928   BILITOT 0.3 12/22/2019 1057   GFRNONAA 84 12/22/2019 1057   GFRAA 97 12/22/2019 1057       Component Value Date/Time   WBC 5.5 04/12/2019 0845   RBC 4.94 04/12/2019 0845   HGB 14.8 04/12/2019 0845   HCT 43.5 04/12/2019 0845   PLT 188.0 04/12/2019 0845   MCV 88.1 04/12/2019 0845   MCHC 34.1 04/12/2019 0845   RDW 12.9 04/12/2019 0845   LYMPHSABS 2.2 04/12/2019 0845   MONOABS 0.4 04/12/2019 0845   EOSABS 0.1 04/12/2019 0845   BASOSABS 0.0 04/12/2019 0845    No results found for: POCLITH, LITHIUM   No results found for: PHENYTOIN, PHENOBARB, VALPROATE, CBMZ   .res  Assessment: Plan:    Quanah was seen today for follow-up, depression and anxiety.  Diagnoses and all orders for this visit:  Recurrent major depression resistant to treatment (HCC)  Obsessive-compulsive disorder, unspecified type  Insomnia, unspecified type  Generalized anxiety disorder  Seasonal affective disorder (HCC)  Attention deficit hyperactivity disorder (ADHD), combined type    Discussed his treatment resistant depression and alternatives to the Spravato.  He would like to pursue the Spravato because of a history of benefit from IV ketamine.  We discussed the side effects of Spravato.  We specifically discussed the dissociation which can be scary at times as well as a risk of hypertension.  We discussed the treatment plan including the first dosage being 56 mg.  Follow-up plan would be 84 mg administered twice weekly for approximately 1 month with a goal of dropping the dosage back to 84 mg administered once weekly.  He tolerated the Spravato without unusual adverse reactions.  He did fill dissociation as expected which resolved over the 2-hour observation..  His vital signs were stable without excessive hypertension reaction.  He had no headache or nausea.  His vital signs were monitored according to the protocol.  Continue current psych meds including fluoxetine 40  mg daily for both depression and OCD.  He is tolerating these medications.  Next Spravato administration will be on Friday at 84 mg dosage.  As indicated this was a 2-hour period of observation.  He agrees with this treatment plan.  Meredith Staggers MD, DFAPA Please see After Visit Summary for patient specific instructions.  Future Appointments  Date Time Provider Department Center  10/13/2020  2:00 PM CP-NURSE [facility] None  10/13/2020  3:30 PM Cottle, Steva Ready., MD [facility] None  10/13/2020  3:45 PM Albert [name] T, PA-C [facility] None  10/25/2020  8:00 AM Romero Belling, MD [facility] None    No orders of the defined types were placed in this encounter.   -------------------------------

## 2020-10-13 ENCOUNTER — Other Ambulatory Visit: Payer: Self-pay

## 2020-10-13 ENCOUNTER — Ambulatory Visit: Payer: 59

## 2020-10-13 ENCOUNTER — Ambulatory Visit: Payer: 59 | Admitting: Physician Assistant

## 2020-10-13 ENCOUNTER — Ambulatory Visit (INDEPENDENT_AMBULATORY_CARE_PROVIDER_SITE_OTHER): Payer: 59 | Admitting: Psychiatry

## 2020-10-13 ENCOUNTER — Encounter: Payer: Self-pay | Admitting: Psychiatry

## 2020-10-13 VITALS — BP 141/92 | HR 61

## 2020-10-13 DIAGNOSIS — F339 Major depressive disorder, recurrent, unspecified: Secondary | ICD-10-CM

## 2020-10-13 DIAGNOSIS — G4700 Insomnia, unspecified: Secondary | ICD-10-CM | POA: Diagnosis not present

## 2020-10-13 DIAGNOSIS — F411 Generalized anxiety disorder: Secondary | ICD-10-CM | POA: Diagnosis not present

## 2020-10-13 DIAGNOSIS — F338 Other recurrent depressive disorders: Secondary | ICD-10-CM

## 2020-10-13 DIAGNOSIS — F429 Obsessive-compulsive disorder, unspecified: Secondary | ICD-10-CM

## 2020-10-13 DIAGNOSIS — F902 Attention-deficit hyperactivity disorder, combined type: Secondary | ICD-10-CM

## 2020-10-13 NOTE — Progress Notes (Signed)
Albert [name] [id number] July 12, 1975 45 y.o.  Subjective:   Patient ID:  Albert [name] is a 45 y.o. (DOB January 30, 1976) male.  Chief Complaint:  Chief Complaint  Patient presents with  . Follow-up  . Depression  . 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  IV ketamine Sept 2021 #6 and it helped depression.  This made him want to pursue Spravato for treatment of his depression.  The depression relapsed after he stopped receiving the IV ketamine.  As noted by [name], PA-C on 08/30/2020 patient remained depressed with madras score of 30.  At that time his SSRI was switched from fluvoxamine to fluoxetine.  Patient has OCD in addition to depression and needed an SSRI.  OCD was diagnosed in his 65s.  His depressive symptoms continue as noted including depressed mood, anhedonia, difficulty with concentration, low energy and motivation.  He is not currently suicidal.  Tolerating Prozac 40.  History of feeling blunted and having sexual side effects from high doses of SSRIs.   10/13/20 appt noted: Today he tolerated the Spravato in a way that was similar to his experience with IV ketamine including a feeling of being "high" with some dissociation which was not frightening.  Had no significant nausea or headache. He tolerated the 84 mg dose as well as he did the 56 mg dosage.  Depressive sx have been a littlle better since first Spravato administration.  Day of administration he was tired and napped.  Hopeful for further improvement.   Tolerating fluoxetine and satisfied with dose. No SI.  Sleep ok generally .  Anxiety present over his depression and need for improvement but also residual OCD.  Past medications for mental health diagnoses include: Luvox 200, Prozac 40,  Wellbutrin,  Risperdal, Abilify, Lunesta, Xanax, Ambien,  Deplin,  naltrexone,  Adderall  Review of  Systems:  Review of Systems  Cardiovascular: Negative for palpitations.  Neurological: Negative for tremors and weakness.    Medications: I have reviewed the patient's current medications.  Current Outpatient Medications  Medication Sig Dispense Refill  . clomiPHENE (CLOMID) 50 MG tablet TAKE 1/2 TABLET BY MOUTH EVERY DAY 45 tablet 3  . Esketamine HCl, 56 MG Dose, (SPRAVATO, 56 MG DOSE,) 28 MG/DEVICE SOPK Place 56 mg nasal spray intranasally for one dose 2 each 0  . Esketamine HCl, 84 MG Dose, (SPRAVATO, 84 MG DOSE,) 28 MG/DEVICE SOPK Place 84 mg into the nose 2 (two) times a week. 3 each 0  . eszopiclone (LUNESTA) 2 MG TABS tablet Take 1 tablet (2 mg total) by mouth at bedtime as needed for sleep. Take immediately before bedtime 30 tablet 5  . ezetimibe (ZETIA) 10 MG tablet Take 1 tablet (10 mg total) by mouth daily. 90 tablet 3  . FLUoxetine (PROZAC) 40 MG capsule Take 1 capsule (40 mg total) by mouth daily. 30 capsule 1  . LORazepam (ATIVAN) 0.5 MG tablet Take 0.5-1 tablets (0.25-0.5 mg total) by mouth 2 (two) times daily as needed for anxiety. 30 tablet 1  . telmisartan-hydrochlorothiazide (MICARDIS HCT) 80-12.5 MG tablet TAKE 1 TABLET BY MOUTH EVERY DAY 90 tablet 1   No current facility-administered medications for this visit.    Medication Side Effects: None  Allergies:  Allergies  Allergen Reactions  . Sulfamethoxazole     Other reaction(s): Arthralgias (intolerance)  . Sulfonamide Derivatives     REACTION: Rash  Stiff joints    Past Medical History:  Diagnosis Date  . Attention deficit hyperactivity disorder (ADHD) 06/19/2016  . Closed nondisplaced fracture of neck of fifth metacarpal bone of right hand 07/10/2016  . CONTACT DERMATITIS&OTHER ECZEMA DUE UNSPEC CAUSE 10/06/2009   Qualifier: Diagnosis of  By: Yetta Barre MD, Bernadene Bell.   . Depression   . DEPRESSION 02/20/2009   Qualifier: Diagnosis of  By: Yetta Barre MD, Bernadene Bell.   . Hyperlipidemia   . NECK PAIN, ACUTE 09/13/2009    Qualifier: Diagnosis of  By: Yetta Barre MD, Bernadene Bell.   . Thrombosed external hemorrhoid 10/06/2015   Symptoms and exam consistent with thrombosed external hemorrhoid. Patient declines incise and drain at this time. Treat conservatively with hydrocortisone rectal cream and Tylenol 3 as needed for discomfort. Recommend sitz bath. Follow-up if symptoms worsen or do not improve or would like to seek interventional procedure.   . TOBACCO USE, QUIT 03/16/2009   Qualifier: Diagnosis of  By: Tora Perches       Past Medical History, Surgical history, Social history, and Family history were reviewed and updated as appropriate.   Please see review of systems for further details on the patient's review from today.   Objective:   Physical Exam:  There were no vitals taken for this visit.  Physical Exam Constitutional:      General: He is not in acute distress. Musculoskeletal:        General: No deformity.  Neurological:     Mental Status: He is alert and oriented to person, place, and time.     Coordination: Coordination normal.  Psychiatric:        Attention and Perception: Attention and perception normal. He does not perceive auditory or visual hallucinations.        Mood and Affect: Mood is anxious and depressed. Affect is not labile, blunt, angry, tearful or inappropriate.        Speech: Speech normal.        Behavior: Behavior normal.        Thought Content: Thought content normal. Thought content is not paranoid or delusional. Thought content does not include homicidal or suicidal ideation. Thought content does not include homicidal or suicidal plan.        Cognition and Memory: Cognition and memory normal.        Judgment: Judgment normal.     Comments: Insight intact Chronic anxiety and OCD is noted along with chronic depression     Lab Review:     Component Value Date/Time   NA 139 03/20/2020 0928   NA 137 12/22/2019 1057   K 3.9 03/20/2020 0928   CL 101 03/20/2020 0928   CO2 29  03/20/2020 0928   GLUCOSE 95 03/20/2020 0928   BUN 20 03/20/2020 0928   BUN 16 12/22/2019 1057   CREATININE 1.12 03/20/2020 0928   CALCIUM 9.4 03/20/2020 0928   PROT 6.8 03/20/2020 0928   PROT 7.5 12/22/2019 1057   ALBUMIN 4.9 12/22/2019 1057   AST 35 03/20/2020 0928   ALT 29 03/20/2020 0928   ALKPHOS 75 12/22/2019 1057   BILITOT 0.5 03/20/2020 0928   BILITOT 0.3 12/22/2019 1057   GFRNONAA 84 12/22/2019 1057   GFRAA 97 12/22/2019 1057       Component Value Date/Time   WBC 5.5 04/12/2019 0845   RBC 4.94 04/12/2019 0845   HGB 14.8 04/12/2019 0845   HCT 43.5 04/12/2019 0845   PLT 188.0 04/12/2019 0845   MCV 88.1 04/12/2019 0845  MCHC 34.1 04/12/2019 0845   RDW 12.9 04/12/2019 0845   LYMPHSABS 2.2 04/12/2019 0845   MONOABS 0.4 04/12/2019 0845   EOSABS 0.1 04/12/2019 0845   BASOSABS 0.0 04/12/2019 0845    No results found for: POCLITH, LITHIUM   No results found for: PHENYTOIN, PHENOBARB, VALPROATE, CBMZ   .res Assessment: Plan:    Uchechukwu was seen today for follow-up, depression and anxiety.  Diagnoses and all orders for this visit:  Recurrent major depression resistant to treatment (HCC)  Obsessive-compulsive disorder, unspecified type  Generalized anxiety disorder  Insomnia, unspecified type  Seasonal affective disorder (HCC)  Attention deficit hyperactivity disorder (ADHD), combined type    Discussed his treatment resistant depression and alternatives to the Spravato.  He would like to pursue the Spravato because of a history of benefit from IV ketamine.  We discussed the side effects of Spravato.  We specifically discussed the dissociation which can be scary at times as well as a risk of hypertension.  We discussed the treatment plan including the first dosage being 56 mg.  Follow-up plan would be 84 mg administered twice weekly for approximately 1 month with a goal of dropping the dosage back to 84 mg administered once weekly.  He tolerated the Spravato  without unusual adverse reactions.  He did fill dissociation as expected which resolved over the 2-hour observation..  It was no worse than first administration of 56 mg dose.  His vital signs were stable without excessive hypertension reaction.  He had no headache or nausea.  His vital signs were monitored according to the protocol.  Continue current psych meds including fluoxetine 40 mg daily for both depression and OCD.  He is tolerating these medications.  Continue Spravato administration 84 mg dosage twice wekly.  As indicated this was a 2-hour period of observation. From 2pm to 4 pm  He agrees with this treatment plan.  Meredith Staggers MD, DFAPA Please see After Visit Summary for patient specific instructions.  Future Appointments  Date Time Provider Department Center  10/17/2020  2:00 PM Cottle, Steva Ready., MD [facility] None  10/17/2020  2:00 PM CP-NURSE [facility] None  10/20/2020  2:00 PM CP-NURSE [facility] None  10/20/2020  3:00 PM Cottle, Steva Ready., MD [facility] None  10/24/2020  2:00 PM CP-NURSE [facility] None  10/24/2020  2:15 PM Cottle, Steva Ready., MD [facility] None  10/25/2020  8:00 AM Romero Belling, MD [facility] None  10/27/2020  2:00 PM Cottle, Steva Ready., MD [facility] None  10/27/2020  2:00 PM CP-NURSE [facility] None  10/31/2020  2:00 PM CP-NURSE [facility] None  10/31/2020  2:15 PM Cottle, Steva Ready., MD [facility] None    No orders of the defined types were placed in this encounter.   -------------------------------

## 2020-10-16 ENCOUNTER — Other Ambulatory Visit: Payer: Self-pay

## 2020-10-16 MED ORDER — SPRAVATO (84 MG DOSE) 28 MG/DEVICE NA SOPK: 84.0000 mg | PACK | NASAL | 0 refills | Status: DC

## 2020-10-17 ENCOUNTER — Ambulatory Visit (INDEPENDENT_AMBULATORY_CARE_PROVIDER_SITE_OTHER): Payer: 59 | Admitting: Psychiatry

## 2020-10-17 ENCOUNTER — Other Ambulatory Visit: Payer: Self-pay

## 2020-10-17 ENCOUNTER — Ambulatory Visit: Payer: 59

## 2020-10-17 VITALS — BP 151/93 | HR 59

## 2020-10-17 DIAGNOSIS — F429 Obsessive-compulsive disorder, unspecified: Secondary | ICD-10-CM | POA: Diagnosis not present

## 2020-10-17 DIAGNOSIS — G4700 Insomnia, unspecified: Secondary | ICD-10-CM | POA: Diagnosis not present

## 2020-10-17 DIAGNOSIS — F411 Generalized anxiety disorder: Secondary | ICD-10-CM

## 2020-10-17 DIAGNOSIS — F339 Major depressive disorder, recurrent, unspecified: Secondary | ICD-10-CM

## 2020-10-17 DIAGNOSIS — F338 Other recurrent depressive disorders: Secondary | ICD-10-CM

## 2020-10-17 DIAGNOSIS — F902 Attention-deficit hyperactivity disorder, combined type: Secondary | ICD-10-CM

## 2020-10-17 MED ORDER — TELMISARTAN-HCTZ 80-12.5 MG PO TABS
1.0000 | ORAL_TABLET | Freq: Every day | ORAL | 1 refills | Status: DC
Start: 2020-10-17 — End: 2020-11-14

## 2020-10-17 MED ORDER — SPRAVATO (84 MG DOSE) 28 MG/DEVICE NA SOPK: 84.0000 mg | PACK | NASAL | 0 refills | Status: DC

## 2020-10-17 NOTE — Progress Notes (Signed)
Nurse visit: pt here for his 1st Spravato treatment for treatment resistant depression. Pt's starting dose is 56 mg ( 2 of the 28 mg) nasal sprays. Pt arrived and taken to treatment room, process explained to pt by Dr. Jennelle Human and nurse. Medication is stored at the doctor's office in a safe and behind locked door per FDA guidelines. Pt is enrolled in the [facility] Spravato Program. Pt's medication is ordered and delivered through [facility] in [place]. Pt's prior approval for Spravato 56 mg and 84 mg effective 09/15/2020-12/15/2020 with [facility]. Pt's initial vital signs at 1445, 119/71, 65. Instructed pt to blow his nose then recline back to a 45 degree angle. Pt began first dose 28 mg nasal spray, pt did well, administered in each nostril as directed, waited 5 minutes in between next dose. Nasal sprays disposed of per FDA guidelines. Pt brought head phones and the lights were turned down for him. Pt sleeping, vital signs assessed at 40 minutes,1530 138/82, 62. Discharge vitals were taken at 1645, 150/82, 59. Pt did experience dissociation but was resolved within 90 minutes. Pt reports his dissociation started quickly and ended quickly. He reports being surprised of the effectiveness. Total time for Spravato treatment was 120 minutes. Pt scheduled again this week on Friday, May 6th and will receive 84 mg.    LOT [id number] EXP 2022 SEP

## 2020-10-20 ENCOUNTER — Ambulatory Visit (INDEPENDENT_AMBULATORY_CARE_PROVIDER_SITE_OTHER): Payer: 59 | Admitting: Psychiatry

## 2020-10-20 ENCOUNTER — Encounter: Payer: Self-pay | Admitting: Psychiatry

## 2020-10-20 ENCOUNTER — Ambulatory Visit: Payer: 59

## 2020-10-20 ENCOUNTER — Other Ambulatory Visit: Payer: Self-pay

## 2020-10-20 VITALS — BP 154/95 | HR 65

## 2020-10-20 DIAGNOSIS — F338 Other recurrent depressive disorders: Secondary | ICD-10-CM

## 2020-10-20 DIAGNOSIS — F411 Generalized anxiety disorder: Secondary | ICD-10-CM

## 2020-10-20 DIAGNOSIS — G4700 Insomnia, unspecified: Secondary | ICD-10-CM

## 2020-10-20 DIAGNOSIS — F339 Major depressive disorder, recurrent, unspecified: Secondary | ICD-10-CM

## 2020-10-20 DIAGNOSIS — F429 Obsessive-compulsive disorder, unspecified: Secondary | ICD-10-CM

## 2020-10-20 DIAGNOSIS — F902 Attention-deficit hyperactivity disorder, combined type: Secondary | ICD-10-CM

## 2020-10-20 NOTE — Progress Notes (Signed)
Albert PolingJohn R [name] [id number] Jul 03, 1975 45 y.o.  Subjective:   Patient ID:  Albert [name] is a [age] y.o. (DOB Jul 03, 1975) male.  Chief Complaint:  Chief Complaint  Patient presents with  . Follow-up  . 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  IV ketamine Sept 2021 #6 and it helped depression.  This made him want to pursue Spravato for treatment of his depression.  The depression relapsed after he stopped receiving the IV ketamine.  As noted by Albert Overlyeresa hurst, PA-C on 08/30/2020 patient remained depressed with madras score of 30.  At that time his SSRI was switched from fluvoxamine to fluoxetine.  Patient has OCD in addition to depression and needed an SSRI.  OCD was diagnosed in his 8520s.  His depressive symptoms continue as noted including depressed mood, anhedonia, difficulty with concentration, low energy and motivation.  He is not currently suicidal.  Tolerating Prozac 40.  History of feeling blunted and having sexual side effects from high doses of SSRIs.   10/13/20 appt noted: Today he tolerated the Spravato in a way that was similar to his experience with IV ketamine including a feeling of being "high" with some dissociation which was not frightening.  Had no significant nausea or headache. He tolerated the 84 mg dose as well as he did the 56 mg dosage.  10/17/2020 appointment with following noted: Here for Spravato administration.  Received 84 mg today and tolerated it well.  It made him feel relaxed.  He had mild dissociation symptoms that resolved by the end of the session.  He feels that it is really helping with his depression.  In fact he feels that is even more effective than the IV ketamine that he had received in the past which was also effective.  His depression is maybe 40 to 50% better.  He has not needed to nap after receiving Spravato and going  home. The OCD is no worse than it was when he was on fluvoxamine.  He has better energy with the switch to fluoxetine. Tolerating fluoxetine and satisfied with dose. No SI.  Sleep ok generally .  Anxiety present over his depression and need for improvement but also residual OCD.  10/20/2020 appointment with the following noted: Here for Spravato administration and received 84 mg and tolerated it well.  He finds it relaxing.  He did notice after last administration he had some trouble sleeping that night though he did not nap in the afternoon.  Otherwise he had no concerns.  Overall still feels like it is as least as effective is IV ketamine for depression and perhaps more so.  He wants to continue the Spravato treatment.  His depression is approximately 50% improved.  He has good days in which he enjoys things and has better mood elevation.  There is still a background level of depression that is present. His OCD is no worse and overall anxiety is no worse than it was before when he was on fluvoxamine.  And his energy is better with the transition to fluoxetine. Tolerating fluoxetine and satisfied with dose. No SI.  Sleep ok generally .  Anxiety present over his depression and need for improvement but also residual OCD.  Past medications for mental health diagnoses include: Luvox 200, Prozac 40,  Wellbutrin,  Risperdal, Abilify, Lunesta, Xanax, Ambien,  Deplin,  naltrexone,  Adderall  Review of Systems:  Review of Systems  Constitutional: Negative for fatigue.  Cardiovascular: Negative for palpitations.  Neurological: Negative for tremors and weakness.    Medications: I have reviewed the patient's current medications.  Current Outpatient Medications  Medication Sig Dispense Refill  . clomiPHENE (CLOMID) 50 MG tablet TAKE 1/2 TABLET BY MOUTH EVERY DAY 45 tablet 3  . Esketamine HCl, 84 MG Dose, (SPRAVATO, 84 MG DOSE,) 28 MG/DEVICE SOPK Place 84 mg into the nose 2 (two) times a week. 3  each 0  . eszopiclone (LUNESTA) 2 MG TABS tablet Take 1 tablet (2 mg total) by mouth at bedtime as needed for sleep. Take immediately before bedtime 30 tablet 5  . ezetimibe (ZETIA) 10 MG tablet Take 1 tablet (10 mg total) by mouth daily. 90 tablet 3  . FLUoxetine (PROZAC) 40 MG capsule Take 1 capsule (40 mg total) by mouth daily. 30 capsule 1  . LORazepam (ATIVAN) 0.5 MG tablet Take 0.5-1 tablets (0.25-0.5 mg total) by mouth 2 (two) times daily as needed for anxiety. 30 tablet 1  . telmisartan-hydrochlorothiazide (MICARDIS HCT) 80-12.5 MG tablet Take 1 tablet by mouth daily. 90 tablet 1   No current facility-administered medications for this visit.    Medication Side Effects: None  Allergies:  Allergies  Allergen Reactions  . Sulfamethoxazole     Other reaction(s): Arthralgias (intolerance)  . Sulfonamide Derivatives     REACTION: Rash Stiff joints    Past Medical History:  Diagnosis Date  . Attention deficit hyperactivity disorder (ADHD) 06/19/2016  . Closed nondisplaced fracture of neck of fifth metacarpal bone of right hand 07/10/2016  . CONTACT DERMATITIS&OTHER ECZEMA DUE UNSPEC CAUSE 10/06/2009   Qualifier: Diagnosis of  By: Yetta Barre MD, Bernadene Bell.   . Depression   . DEPRESSION 02/20/2009   Qualifier: Diagnosis of  By: Yetta Barre MD, Bernadene Bell.   . Hyperlipidemia   . NECK PAIN, ACUTE 09/13/2009   Qualifier: Diagnosis of  By: Yetta Barre MD, Bernadene Bell.   . Thrombosed external hemorrhoid 10/06/2015   Symptoms and exam consistent with thrombosed external hemorrhoid. Patient declines incise and drain at this time. Treat conservatively with hydrocortisone rectal cream and Tylenol 3 as needed for discomfort. Recommend sitz bath. Follow-up if symptoms worsen or do not improve or would like to seek interventional procedure.   . TOBACCO USE, QUIT 03/16/2009   Qualifier: Diagnosis of  By: Tora Perches       Past Medical History, Surgical history, Social history, and Family history were reviewed and  updated as appropriate.   Please see review of systems for further details on the patient's review from today.   Objective:   Physical Exam:  There were no vitals taken for this visit.  Physical Exam Constitutional:      General: He is not in acute distress. Musculoskeletal:        General: No deformity.  Neurological:     Mental Status: He is alert and oriented to person, place, and time.     Coordination: Coordination normal.  Psychiatric:        Attention and Perception: Attention and perception normal. He does not perceive auditory or visual hallucinations.        Mood and Affect: Mood is anxious and depressed. Affect is not labile, blunt, angry, tearful or inappropriate.        Speech: Speech normal.        Behavior: Behavior normal.        Thought Content: Thought content normal. Thought content is not paranoid  or delusional. Thought content does not include homicidal or suicidal ideation. Thought content does not include homicidal or suicidal plan.        Cognition and Memory: Cognition and memory normal.        Judgment: Judgment normal.     Comments: Insight intact Chronic anxiety and OCD is noted along with chronic depression.  The OCD is no worse and the depression is improved     Lab Review:     Component Value Date/Time   NA 139 03/20/2020 0928   NA 137 12/22/2019 1057   K 3.9 03/20/2020 0928   CL 101 03/20/2020 0928   CO2 29 03/20/2020 0928   GLUCOSE 95 03/20/2020 0928   BUN 20 03/20/2020 0928   BUN 16 12/22/2019 1057   CREATININE 1.12 03/20/2020 0928   CALCIUM 9.4 03/20/2020 0928   PROT 6.8 03/20/2020 0928   PROT 7.5 12/22/2019 1057   ALBUMIN 4.9 12/22/2019 1057   AST 35 03/20/2020 0928   ALT 29 03/20/2020 0928   ALKPHOS 75 12/22/2019 1057   BILITOT 0.5 03/20/2020 0928   BILITOT 0.3 12/22/2019 1057   GFRNONAA 84 12/22/2019 1057   GFRAA 97 12/22/2019 1057       Component Value Date/Time   WBC 5.5 04/12/2019 0845   RBC 4.94 04/12/2019 0845   HGB  14.8 04/12/2019 0845   HCT 43.5 04/12/2019 0845   PLT 188.0 04/12/2019 0845   MCV 88.1 04/12/2019 0845   MCHC 34.1 04/12/2019 0845   RDW 12.9 04/12/2019 0845   LYMPHSABS 2.2 04/12/2019 0845   MONOABS 0.4 04/12/2019 0845   EOSABS 0.1 04/12/2019 0845   BASOSABS 0.0 04/12/2019 0845    No results found for: POCLITH, LITHIUM   No results found for: PHENYTOIN, PHENOBARB, VALPROATE, CBMZ   .res Assessment: Plan:    Robby was seen today for follow-up and depression.  Diagnoses and all orders for this visit:  Recurrent major depression resistant to treatment (HCC)  Obsessive-compulsive disorder, unspecified type  Generalized anxiety disorder  Insomnia, unspecified type  Seasonal affective disorder (HCC)  Attention deficit hyperactivity disorder (ADHD), combined type    Discussed his treatment resistant depression and alternatives to the Spravato.  He would like to continue the Spravato because of a history of benefit from IV ketamine.  We discussed the side effects of Spravato.  We specifically discussed the dissociation which can be scary at times as well as a risk of hypertension.  The treatment plan would be 84 mg administered twice weekly for approximately 1 month with a goal of dropping the dosage back to 84 mg administered once weekly.  He tolerated the Spravato without unusual adverse reactions.  He did fill dissociation as expected which resolved over the 2-hour observation..  It was no worse than first administration of 56 mg dose.  His vital signs were stable without excessive hypertension reaction.  He had no headache or nausea.  His vital signs were monitored according to the protocol.  Continue current psych meds including fluoxetine 40 mg daily for both depression and OCD.  He is tolerating these medications.  We discussed that it is still early in the transition from fluvoxamine to fluoxetine in regard to the OCD.  We discussed how OCD symptoms can change very gradually  over a period of a few months after transition from one SSRI to another.  Therefore he is at some risk of worsening OCD given the relatively low dose of fluoxetine.  He would like to keep  the dosage low in order to minimize sexual side effects. Continue fluoxetine 40 mg daily  Continue Spravato administration 84 mg dosage twice wekly.  As indicated this was a 2-hour period of observation. From 2pm to 4 pm  He agrees with this treatment plan.  Meredith Staggers MD, DFAPA Please see After Visit Summary for patient specific instructions.  Future Appointments  Date Time Provider Department Center  10/24/2020  2:00 PM CP-NURSE [facility] None  10/24/2020  2:15 PM Cottle, Steva Ready., MD [facility] None  10/25/2020  8:00 AM Romero Belling, MD [facility] None  10/27/2020  2:00 PM Cottle, Steva Ready., MD [facility] None  10/27/2020  2:00 PM CP-NURSE [facility] None  10/31/2020  2:00 PM CP-NURSE [facility] None  10/31/2020  2:15 PM Cottle, Steva Ready., MD [facility] None    No orders of the defined types were placed in this encounter.   -------------------------------

## 2020-10-20 NOTE — Progress Notes (Signed)
Albert [name] [id number] 1975/07/12 45 y.o.  Subjective:   Patient ID:  Albert [name] is a 45 y.o. (DOB 1975/11/26) male.  Chief Complaint:  Chief Complaint  Patient presents with  . Follow-up  . Depression  . 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  IV ketamine Sept 2021 #6 and it helped depression.  This made him want to pursue Spravato for treatment of his depression.  The depression relapsed after he stopped receiving the IV ketamine.  As noted by [name], PA-C on 08/30/2020 patient remained depressed with madras score of 30.  At that time his SSRI was switched from fluvoxamine to fluoxetine.  Patient has OCD in addition to depression and needed an SSRI.  OCD was diagnosed in his 32s.  His depressive symptoms continue as noted including depressed mood, anhedonia, difficulty with concentration, low energy and motivation.  He is not currently suicidal.  Tolerating Prozac 40.  History of feeling blunted and having sexual side effects from high doses of SSRIs.   10/13/20 appt noted: Today he tolerated the Spravato in a way that was similar to his experience with IV ketamine including a feeling of being "high" with some dissociation which was not frightening.  Had no significant nausea or headache. He tolerated the 84 mg dose as well as he did the 56 mg dosage.   10/17/2020 appointment with following noted: Here for Spravato administration.  Received 84 mg today and tolerated it well.  It made him feel relaxed.  He had mild dissociation symptoms that resolved by the end of the session.  He feels that it is really helping with his depression.  In fact he feels that is even more effective than the IV ketamine that he had received in the past which was also effective.  His depression is maybe 40 to 50% better.  He has not needed to nap after receiving Spravato and  going home.  The OCD is no worse than it was when he was on fluvoxamine.  He has better energy with the switch to fluoxetine. Tolerating fluoxetine and satisfied with dose. No SI.  Sleep ok generally .  Anxiety present over his depression and need for improvement but also residual OCD.  Past medications for mental health diagnoses include: Luvox 200, Prozac 40,  Wellbutrin,  Risperdal, Abilify, Lunesta, Xanax, Ambien,  Deplin,  naltrexone,  Adderall  Review of Systems:  Review of Systems  Constitutional: Negative for fatigue.  Cardiovascular: Negative for palpitations.  Neurological: Negative for tremors and weakness.    Medications: I have reviewed the patient's current medications.  Current Outpatient Medications  Medication Sig Dispense Refill  . clomiPHENE (CLOMID) 50 MG tablet TAKE 1/2 TABLET BY MOUTH EVERY DAY 45 tablet 3  . Esketamine HCl, 84 MG Dose, (SPRAVATO, 84 MG DOSE,) 28 MG/DEVICE SOPK Place 84 mg into the nose 2 (two) times a week. 3 each 0  . eszopiclone (LUNESTA) 2 MG TABS tablet Take 1 tablet (2 mg total) by mouth at bedtime as needed for sleep. Take immediately before bedtime 30 tablet 5  . ezetimibe (ZETIA) 10 MG tablet Take 1 tablet (10 mg total) by mouth daily. 90 tablet 3  . FLUoxetine (PROZAC) 40 MG capsule Take 1 capsule (40 mg total) by mouth daily. 30 capsule 1  . LORazepam (ATIVAN) 0.5 MG tablet Take 0.5-1 tablets (0.25-0.5 mg total) by mouth  2 (two) times daily as needed for anxiety. 30 tablet 1  . telmisartan-hydrochlorothiazide (MICARDIS HCT) 80-12.5 MG tablet Take 1 tablet by mouth daily. 90 tablet 1   No current facility-administered medications for this visit.    Medication Side Effects: None  Allergies:  Allergies  Allergen Reactions  . Sulfamethoxazole     Other reaction(s): Arthralgias (intolerance)  . Sulfonamide Derivatives     REACTION: Rash Stiff joints    Past Medical History:  Diagnosis Date  . Attention deficit hyperactivity  disorder (ADHD) 06/19/2016  . Closed nondisplaced fracture of neck of fifth metacarpal bone of right hand 07/10/2016  . CONTACT DERMATITIS&OTHER ECZEMA DUE UNSPEC CAUSE 10/06/2009   Qualifier: Diagnosis of  By: Yetta Barre MD, Bernadene Bell.   . Depression   . DEPRESSION 02/20/2009   Qualifier: Diagnosis of  By: Yetta Barre MD, Bernadene Bell.   . Hyperlipidemia   . NECK PAIN, ACUTE 09/13/2009   Qualifier: Diagnosis of  By: Yetta Barre MD, Bernadene Bell.   . Thrombosed external hemorrhoid 10/06/2015   Symptoms and exam consistent with thrombosed external hemorrhoid. Patient declines incise and drain at this time. Treat conservatively with hydrocortisone rectal cream and Tylenol 3 as needed for discomfort. Recommend sitz bath. Follow-up if symptoms worsen or do not improve or would like to seek interventional procedure.   . TOBACCO USE, QUIT 03/16/2009   Qualifier: Diagnosis of  By: Tora Perches       Past Medical History, Surgical history, Social history, and Family history were reviewed and updated as appropriate.   Please see review of systems for further details on the patient's review from today.   Objective:   Physical Exam:  There were no vitals taken for this visit.  Physical Exam Constitutional:      General: He is not in acute distress. Musculoskeletal:        General: No deformity.  Neurological:     Mental Status: He is alert and oriented to person, place, and time.     Coordination: Coordination normal.  Psychiatric:        Attention and Perception: Attention and perception normal. He does not perceive auditory or visual hallucinations.        Mood and Affect: Mood is anxious and depressed. Affect is not labile, blunt, angry or inappropriate.        Speech: Speech normal.        Behavior: Behavior normal.        Thought Content: Thought content normal. Thought content is not paranoid or delusional. Thought content does not include homicidal or suicidal ideation. Thought content does not include homicidal  or suicidal plan.        Cognition and Memory: Cognition and memory normal.        Judgment: Judgment normal.     Comments: Insight intact Chronic anxiety and OCD is noted along with chronic depression     Lab Review:     Component Value Date/Time   NA 139 03/20/2020 0928   NA 137 12/22/2019 1057   K 3.9 03/20/2020 0928   CL 101 03/20/2020 0928   CO2 29 03/20/2020 0928   GLUCOSE 95 03/20/2020 0928   BUN 20 03/20/2020 0928   BUN 16 12/22/2019 1057   CREATININE 1.12 03/20/2020 0928   CALCIUM 9.4 03/20/2020 0928   PROT 6.8 03/20/2020 0928   PROT 7.5 12/22/2019 1057   ALBUMIN 4.9 12/22/2019 1057   AST 35 03/20/2020 0928   ALT 29 03/20/2020 0928   ALKPHOS 75  12/22/2019 1057   BILITOT 0.5 03/20/2020 0928   BILITOT 0.3 12/22/2019 1057   GFRNONAA 84 12/22/2019 1057   GFRAA 97 12/22/2019 1057       Component Value Date/Time   WBC 5.5 04/12/2019 0845   RBC 4.94 04/12/2019 0845   HGB 14.8 04/12/2019 0845   HCT 43.5 04/12/2019 0845   PLT 188.0 04/12/2019 0845   MCV 88.1 04/12/2019 0845   MCHC 34.1 04/12/2019 0845   RDW 12.9 04/12/2019 0845   LYMPHSABS 2.2 04/12/2019 0845   MONOABS 0.4 04/12/2019 0845   EOSABS 0.1 04/12/2019 0845   BASOSABS 0.0 04/12/2019 0845    No results found for: POCLITH, LITHIUM   No results found for: PHENYTOIN, PHENOBARB, VALPROATE, CBMZ   .res Assessment: Plan:    Albert [name] was seen today for follow-up, depression and anxiety.  Diagnoses and all orders for this visit:  Recurrent major depression resistant to treatment (HCC)  Obsessive-compulsive disorder, unspecified type  Generalized anxiety disorder  Insomnia, unspecified type  Seasonal affective disorder (HCC)  Attention deficit hyperactivity disorder (ADHD), combined type    Discussed his treatment resistant depression and alternatives to the Spravato.  He would like to continue the Spravato because of a history of benefit from IV ketamine.  We discussed the side effects of  Spravato.  We specifically discussed the dissociation which can be scary at times as well as a risk of hypertension. Discussed the treatment plan of Spravato 84 mg administered twice weekly for approximately 1 month with a goal of dropping the dosage back to 84 mg administered once weekly.  He tolerated the Spravato without unusual adverse reactions.  He did fill dissociation as expected which resolved over the 2-hour observation..  It was no worse than first administration of 56 mg dose.  His vital signs were stable without excessive hypertension reaction.  He had no headache or nausea.  His vital signs were monitored according to the protocol.  And they were stable  He feels the Spravato has been at least as effective as the IV ketamine that he asked took in the past.  His depression is markedly improved.  Continue current psych meds including fluoxetine 40 mg daily for both depression and OCD.  He is tolerating these medications.  He feels his energy is better on the fluoxetine and so for OCD is no worse.  Continue Spravato administration 84 mg dosage twice wekly.  As indicated this was a 2-hour period of observation.  He agrees with this treatment plan.  Meredith Staggers MD, DFAPA Please see After Visit Summary for patient specific instructions.  Future Appointments  Date Time Provider Department Center  10/24/2020  2:00 PM CP-NURSE [facility] None  10/24/2020  2:15 PM Cottle, Steva Ready., MD [facility] None  10/25/2020  8:00 AM Romero Belling, MD [facility] None  10/27/2020  2:00 PM Cottle, Steva Ready., MD [facility] None  10/27/2020  2:00 PM CP-NURSE [facility] None  10/31/2020  2:00 PM CP-NURSE [facility] None  10/31/2020  2:15 PM Cottle, Steva Ready., MD [facility] None    No orders of the defined types were placed in this encounter.   -------------------------------

## 2020-10-22 NOTE — Progress Notes (Signed)
Nurse visit:  Pt here for his Spravato treatment for treatment resistant depression. He will receive 84 mg (3 of the 28 mg) nasal sprays today. Pt taken to treatment room.  Pt reports he's feeling good after his treatment on 10/10/2020. Spravato is stored at the doctor's office per FDA regulations in the safe behind another locked door due to being a schedule CIII medicaton. Pt's medication is delivered by [facility] in [place]. After nasal sprays used they are disposed of per FDA regulations. Beginning vital signs taken were 127/91, 88 pulse. Pt instructed to blow his nose and recline back 45 degrees. He began his first nasal spray approximately 1355, with 5 minutes between next 2 nasal sprays. Pt is still trying to figure out using the nasal sprays accurately, they are hard to get comfortable with. Instructions from the [facility] pamphlet reviewed. Pt brings head phones and an eye mask and relaxes in recliner. Pt sleeping off and on during treatment. He also prefers the lights out overhead. Pt awoken for his 40 minute vital sign check at 1432, B/P 132/87, pulse 73. Pt then resting/sleeping till Dr. Jennelle Human comes in near end of treatment to discuss how he's feeling and his medication. Discharge vital signs at 1550 were 141/92, pulse 61. Vital signs have been stable. Pt reports he does take B/P medication but normally doesn't check his readings at home. Pt is feeling good at discharge with no complaints. Pt contacts his wife to pick him up, they live very close to office. Pt is scheduled next week on Tuesday, May 10 th. He will also come twice next week.    LOT [id number] EXPT 2023 AUG   LOT [id number] EXP 2022 SEP

## 2020-10-23 ENCOUNTER — Other Ambulatory Visit: Payer: Self-pay

## 2020-10-23 MED ORDER — SPRAVATO (84 MG DOSE) 28 MG/DEVICE NA SOPK: 84.0000 mg | PACK | NASAL | 0 refills | Status: DC

## 2020-10-24 ENCOUNTER — Ambulatory Visit (INDEPENDENT_AMBULATORY_CARE_PROVIDER_SITE_OTHER): Payer: 59 | Admitting: Psychiatry

## 2020-10-24 ENCOUNTER — Ambulatory Visit: Payer: 59

## 2020-10-24 ENCOUNTER — Encounter: Payer: Self-pay | Admitting: Psychiatry

## 2020-10-24 ENCOUNTER — Other Ambulatory Visit: Payer: Self-pay

## 2020-10-24 VITALS — BP 154/92 | HR 62

## 2020-10-24 DIAGNOSIS — F339 Major depressive disorder, recurrent, unspecified: Secondary | ICD-10-CM | POA: Diagnosis not present

## 2020-10-24 DIAGNOSIS — F429 Obsessive-compulsive disorder, unspecified: Secondary | ICD-10-CM | POA: Diagnosis not present

## 2020-10-24 DIAGNOSIS — F338 Other recurrent depressive disorders: Secondary | ICD-10-CM

## 2020-10-24 DIAGNOSIS — G4700 Insomnia, unspecified: Secondary | ICD-10-CM | POA: Diagnosis not present

## 2020-10-24 DIAGNOSIS — F411 Generalized anxiety disorder: Secondary | ICD-10-CM

## 2020-10-24 NOTE — Progress Notes (Signed)
Albert [name] [id number] [birth year] 45 y.o.  Subjective:   Patient ID:  Albert [name] is a 45 y.o. (DOB [birth year]) male.  Chief Complaint:  Chief Complaint  Patient presents with  . Follow-up  . Depression  . 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  IV ketamine Sept 2021 #6 and it helped depression.  This made him want to pursue Spravato for treatment of his depression.  The depression relapsed after he stopped receiving the IV ketamine.  As noted by [name], PA-C on 08/30/2020 patient remained depressed with madras score of 30.  At that time his SSRI was switched from fluvoxamine to fluoxetine.  Patient has OCD in addition to depression and needed an SSRI.  OCD was diagnosed in his 18s.  His depressive symptoms continue as noted including depressed mood, anhedonia, difficulty with concentration, low energy and motivation.  He is not currently suicidal.  Tolerating Prozac 40.  History of feeling blunted and having sexual side effects from high doses of SSRIs.   10/13/20 appt noted: Today he tolerated the Spravato in a way that was similar to his experience with IV ketamine including a feeling of being "high" with some dissociation which was not frightening.  Had no significant nausea or headache. He tolerated the 84 mg dose as well as he did the 56 mg dosage.  10/17/2020 appointment with following noted: Here for Spravato administration.  Received 84 mg today and tolerated it well.  It made him feel relaxed.  He had mild dissociation symptoms that resolved by the end of the session.  He feels that it is really helping with his depression.  In fact he feels that is even more effective than the IV ketamine that he had received in the past which was also effective.  His depression is maybe 40 to 50% better.  He has not needed to nap after receiving Spravato and  going home. The OCD is no worse than it was when he was on fluvoxamine.  He has better energy with the switch to fluoxetine. Tolerating fluoxetine and satisfied with dose. No SI.  Sleep ok generally .  Anxiety present over his depression and need for improvement but also residual OCD.  10/20/2020 appointment with the following noted: Here for Spravato administration and received 84 mg and tolerated it well.  He finds it relaxing.  He did notice after last administration he had some trouble sleeping that night though he did not nap in the afternoon.  Otherwise he had no concerns.  Overall still feels like it is as least as effective is IV ketamine for depression and perhaps more so.  He wants to continue the Spravato treatment.  His depression is approximately 50% improved.  He has good days in which he enjoys things and has better mood elevation.  There is still a background level of depression that is present. His OCD is no worse and overall anxiety is no worse than it was before when he was on fluvoxamine.  And his energy is better with the transition to fluoxetine. Tolerating fluoxetine and satisfied with dose. No SI.  Sleep ok generally .  Anxiety present over his depression and need for improvement but also residual OCD. Plan: continue treatment plan and fluoxetine 40  10/24/2020 appointment with the following noted: Administered Spravato 84 mg and tolerated it well.  No disturbing amounts of dissociation  but certainly had the expected amount of dissociation.  He finds the experience relaxing.  No nausea or vomiting headache.  His blood pressure was a little high today but he did not have any associated symptoms.  Had a very good weekend with lower rates of depression.  He feels the [place] is working very well for his depression and perhaps even better than the IV ketamine had worked.  Also still pleased with the fluoxetine switch and that his energy is a little better and so far his OCD is no  worse on fluoxetine 40 mg versus fluvoxamine.  Anxiety is manageable.  Discussed concerns about his daughter who also has OCD and is on fluoxetine.  Past medications for mental health diagnoses include: Luvox 200, Prozac 40,  Wellbutrin,  Risperdal, Abilify, Lunesta, Xanax, Ambien,  Deplin,  naltrexone,  Adderall  Review of Systems:  Review of Systems  Constitutional: Negative for fatigue.  Cardiovascular: Negative for chest pain and palpitations.  Neurological: Negative for tremors and weakness.    Medications: I have reviewed the patient's current medications.  Current Outpatient Medications  Medication Sig Dispense Refill  . clomiPHENE (CLOMID) 50 MG tablet TAKE 1/2 TABLET BY MOUTH EVERY DAY 45 tablet 3  . Esketamine HCl, 84 MG Dose, (SPRAVATO, 84 MG DOSE,) 28 MG/DEVICE SOPK Place 84 mg into the nose 2 (two) times a week. 3 each 0  . eszopiclone (LUNESTA) 2 MG TABS tablet Take 1 tablet (2 mg total) by mouth at bedtime as needed for sleep. Take immediately before bedtime 30 tablet 5  . ezetimibe (ZETIA) 10 MG tablet Take 1 tablet (10 mg total) by mouth daily. 90 tablet 3  . FLUoxetine (PROZAC) 40 MG capsule Take 1 capsule (40 mg total) by mouth daily. 30 capsule 1  . LORazepam (ATIVAN) 0.5 MG tablet Take 0.5-1 tablets (0.25-0.5 mg total) by mouth 2 (two) times daily as needed for anxiety. 30 tablet 1  . telmisartan-hydrochlorothiazide (MICARDIS HCT) 80-12.5 MG tablet Take 1 tablet by mouth daily. 90 tablet 1   No current facility-administered medications for this visit.    Medication Side Effects: None  Allergies:  Allergies  Allergen Reactions  . Sulfamethoxazole     Other reaction(s): Arthralgias (intolerance)  . Sulfonamide Derivatives     REACTION: Rash Stiff joints    Past Medical History:  Diagnosis Date  . Attention deficit hyperactivity disorder (ADHD) 06/19/2016  . Closed nondisplaced fracture of neck of fifth metacarpal bone of right hand 07/10/2016  . CONTACT  DERMATITIS&OTHER ECZEMA DUE UNSPEC CAUSE 10/06/2009   Qualifier: Diagnosis of  By: Yetta Barre MD, Bernadene Bell.   . Depression   . DEPRESSION 02/20/2009   Qualifier: Diagnosis of  By: Yetta Barre MD, Bernadene Bell.   . Hyperlipidemia   . NECK PAIN, ACUTE 09/13/2009   Qualifier: Diagnosis of  By: Yetta Barre MD, Bernadene Bell.   . Thrombosed external hemorrhoid 10/06/2015   Symptoms and exam consistent with thrombosed external hemorrhoid. Patient declines incise and drain at this time. Treat conservatively with hydrocortisone rectal cream and Tylenol 3 as needed for discomfort. Recommend sitz bath. Follow-up if symptoms worsen or do not improve or would like to seek interventional procedure.   . TOBACCO USE, QUIT 03/16/2009   Qualifier: Diagnosis of  By: Tora Perches       Past Medical History, Surgical history, Social history, and Family history were reviewed and updated as appropriate.   Please see review of systems for further details on the patient's review from today.  Objective:   Physical Exam:  There were no vitals taken for this visit.  Physical Exam Constitutional:      General: He is not in acute distress. Musculoskeletal:        General: No deformity.  Neurological:     Mental Status: He is alert and oriented to person, place, and time.     Coordination: Coordination normal.  Psychiatric:        Attention and Perception: Attention and perception normal. He does not perceive auditory or visual hallucinations.        Mood and Affect: Mood is anxious and depressed. Affect is not labile, blunt, angry, tearful or inappropriate.        Speech: Speech normal.        Behavior: Behavior normal.        Thought Content: Thought content normal. Thought content is not paranoid or delusional. Thought content does not include homicidal or suicidal ideation. Thought content does not include homicidal or suicidal plan.        Cognition and Memory: Cognition and memory normal.        Judgment: Judgment normal.      Comments: Insight intact Chronic anxiety and OCD is noted along with chronic depression.  The OCD is no worse and the depression is improved significantly.     Lab Review:     Component Value Date/Time   NA 139 03/20/2020 0928   NA 137 12/22/2019 1057   K 3.9 03/20/2020 0928   CL 101 03/20/2020 0928   CO2 29 03/20/2020 0928   GLUCOSE 95 03/20/2020 0928   BUN 20 03/20/2020 0928   BUN 16 12/22/2019 1057   CREATININE 1.12 03/20/2020 0928   CALCIUM 9.4 03/20/2020 0928   PROT 6.8 03/20/2020 0928   PROT 7.5 12/22/2019 1057   ALBUMIN 4.9 12/22/2019 1057   AST 35 03/20/2020 0928   ALT 29 03/20/2020 0928   ALKPHOS 75 12/22/2019 1057   BILITOT 0.5 03/20/2020 0928   BILITOT 0.3 12/22/2019 1057   GFRNONAA 84 12/22/2019 1057   GFRAA 97 12/22/2019 1057       Component Value Date/Time   WBC 5.5 04/12/2019 0845   RBC 4.94 04/12/2019 0845   HGB 14.8 04/12/2019 0845   HCT 43.5 04/12/2019 0845   PLT 188.0 04/12/2019 0845   MCV 88.1 04/12/2019 0845   MCHC 34.1 04/12/2019 0845   RDW 12.9 04/12/2019 0845   LYMPHSABS 2.2 04/12/2019 0845   MONOABS 0.4 04/12/2019 0845   EOSABS 0.1 04/12/2019 0845   BASOSABS 0.0 04/12/2019 0845    No results found for: POCLITH, LITHIUM   No results found for: PHENYTOIN, PHENOBARB, VALPROATE, CBMZ   .res Assessment: Plan:    Micco was seen today for follow-up, depression and anxiety.  Diagnoses and all orders for this visit:  Recurrent major depression resistant to treatment (HCC)  Obsessive-compulsive disorder, unspecified type  Generalized anxiety disorder  Insomnia, unspecified type  Seasonal affective disorder (HCC)    Discussed his treatment resistant depression and alternatives to the Spravato.  He would like to continue the Spravato because of a history of benefit from IV ketamine.  We discussed the side effects of Spravato.  We specifically discussed the dissociation which can be scary at times as well as a risk of hypertension.  The  treatment plan would be 84 mg administered twice weekly for approximately 1 month with a goal of dropping the dosage back to 84 mg administered once weekly.  He will receive  Spravato twice this week and once next week and then has a trip.  At that point he will start once weekly Spravato assuming he does not have an early relapse.  He tolerated the Spravato without unusual adverse reactions.  He did fill dissociation as expected which resolved over the 2-hour observation..  It was no worse than first administration of 56 mg dose.  His vital signs indicated borderline hypertension prior to the procedure which then went down during the procedure and back up to 150/100.  He had no headache or nausea.  His vital signs were monitored according to the protocol.   Discussed his hypertension in detail.  He has had other instances of borderline high blood pressure and is on blood pressure medicines.  It was suggested he record his blood pressure at various times of day and discussed this with his doctor in the near future.  He has plans to do so.  Continue current psych meds including fluoxetine 40 mg daily for both depression and OCD.  He is tolerating these medications.  We discussed that it is still early in the transition from fluvoxamine to fluoxetine in regard to the OCD.  We discussed how OCD symptoms can change very gradually over a period of a few months after transition from one SSRI to another.  Therefore he is at some risk of worsening OCD given the relatively low dose of fluoxetine.  He would like to keep the dosage low in order to minimize sexual side effects. Continue fluoxetine 40 mg daily  Continue Spravato administration 84 mg dosage twice wekly.  As indicated this was a 2-hour period of observation. From 2pm to 4 pm  He agrees with this treatment plan.  Meredith Staggersarey Cottle MD, DFAPA Please see After Visit Summary for patient specific instructions.  Future Appointments  Date Time Provider  Department Center  10/25/2020  8:00 AM Romero BellingEllison, Sean, MD [facility] None  10/27/2020  2:00 PM Cottle, Steva Readyarey G Jr., MD [facility] None  10/27/2020  2:00 PM CP-NURSE [facility] None  10/31/2020  2:00 PM CP-NURSE [facility] None  10/31/2020  2:15 PM Cottle, Steva Readyarey G Jr., MD [facility] None    No orders of the defined types were placed in this encounter.   -------------------------------

## 2020-10-25 ENCOUNTER — Encounter: Payer: Self-pay | Admitting: Endocrinology

## 2020-10-25 ENCOUNTER — Other Ambulatory Visit: Payer: Self-pay

## 2020-10-25 ENCOUNTER — Ambulatory Visit: Payer: 59 | Admitting: Endocrinology

## 2020-10-25 DIAGNOSIS — R7989 Other specified abnormal findings of blood chemistry: Secondary | ICD-10-CM

## 2020-10-25 MED ORDER — SPRAVATO (84 MG DOSE) 28 MG/DEVICE NA SOPK: 84.0000 mg | PACK | NASAL | 0 refills | Status: DC

## 2020-10-25 NOTE — Progress Notes (Signed)
Subjective:    Patient ID: Albert [name], male    DOB: 1975/08/27, 45 y.o.   MRN: [id number]  HPI Pt is referred by for hypogonadism.  Pt reports he had puberty at age 63.  He has 1 biological child.  He says he has never taken illicit androgens.  He has never had pituitary imaging. He does not take antiandrogens or opioids.  He denies any h/o infertility, XRT, or genital infection.  He has never had surgery, or a serious injury to the head or genital area. He has no h/o sleep apnea or DVT.   pt says he consumes alcohol more then he feels he should.  He was noted to have low testosterone in 2014.  He took topical testosterone, then injections x a few months, but this was changed to clomid to allow for fertility.  He reports decreased libido and fatigue.   Past Medical History:  Diagnosis Date  . Attention deficit hyperactivity disorder (ADHD) 06/19/2016  . Closed nondisplaced fracture of neck of fifth metacarpal bone of right hand 07/10/2016  . CONTACT DERMATITIS&OTHER ECZEMA DUE UNSPEC CAUSE 10/06/2009   Qualifier: Diagnosis of  By: Yetta Barre MD, Bernadene Bell.   . Depression   . DEPRESSION 02/20/2009   Qualifier: Diagnosis of  By: Yetta Barre MD, Bernadene Bell.   . Hyperlipidemia   . NECK PAIN, ACUTE 09/13/2009   Qualifier: Diagnosis of  By: Yetta Barre MD, Bernadene Bell.   . Thrombosed external hemorrhoid 10/06/2015   Symptoms and exam consistent with thrombosed external hemorrhoid. Patient declines incise and drain at this time. Treat conservatively with hydrocortisone rectal cream and Tylenol 3 as needed for discomfort. Recommend sitz bath. Follow-up if symptoms worsen or do not improve or would like to seek interventional procedure.   . TOBACCO USE, QUIT 03/16/2009   Qualifier: Diagnosis of  By: Tora Perches      Past Surgical History:  Procedure Laterality Date  . HERNIA REPAIR      Social History   Socioeconomic History  . Marital status: Married    Spouse name: Not on file  . Number of children: 1  . Years  of education: 28  . Highest education level: Not on file  Occupational History  . Occupation: Sales  Tobacco Use  . Smoking status: Never Smoker  . Smokeless tobacco: Never Used  . Tobacco comment: socially smoked years ago.  Vaping Use  . Vaping Use: Never used  Substance and Sexual Activity  . Alcohol use: Yes    Alcohol/week: 7.0 standard drinks    Types: 3 Glasses of wine, 4 Standard drinks or equivalent per week  . Drug use: No  . Sexual activity: Yes  Other Topics Concern  . Not on file  Social History Narrative   Fun: Exercise, play music, sports   Social Determinants of Health   Financial Resource Strain: Not on file  Food Insecurity: Not on file  Transportation Needs: Not on file  Physical Activity: Not on file  Stress: Not on file  Social Connections: Not on file  Intimate Partner Violence: Not on file    Current Outpatient Medications on File Prior to Visit  Medication Sig Dispense Refill  . clomiPHENE (CLOMID) 50 MG tablet TAKE 1/2 TABLET BY MOUTH EVERY DAY 45 tablet 3  . eszopiclone (LUNESTA) 2 MG TABS tablet Take 1 tablet (2 mg total) by mouth at bedtime as needed for sleep. Take immediately before bedtime 30 tablet 5  . ezetimibe (ZETIA) 10 MG tablet Take  1 tablet (10 mg total) by mouth daily. 90 tablet 3  . FLUoxetine (PROZAC) 40 MG capsule Take 1 capsule (40 mg total) by mouth daily. 30 capsule 1  . LORazepam (ATIVAN) 0.5 MG tablet Take 0.5-1 tablets (0.25-0.5 mg total) by mouth 2 (two) times daily as needed for anxiety. 30 tablet 1  . telmisartan-hydrochlorothiazide (MICARDIS HCT) 80-12.5 MG tablet Take 1 tablet by mouth daily. 90 tablet 1   No current facility-administered medications on file prior to visit.    Allergies  Allergen Reactions  . Sulfamethoxazole     Other reaction(s): Arthralgias (intolerance)  . Sulfonamide Derivatives     REACTION: Rash Stiff joints    Family History  Problem Relation Age of Onset  . Healthy Mother   .  Hyperlipidemia Paternal Grandfather   . Heart disease Paternal Grandfather   . Heart failure Paternal Grandfather   . Other Neg Hx        low testosterone    BP 120/70 (BP Location: Right Arm, Patient Position: Sitting, Cuff Size: Large)   Pulse 69   Ht 6' (1.829 m)   Wt 196 lb 12.8 oz (89.3 kg)   SpO2 97%   BMI 26.69 kg/m    Review of Systems denies erectile dysfunction, weight change, headache, and sob.  He says depression is well-controlled.       Objective:   Physical Exam VS: see vs page GEN: no distress HEAD: head: no deformity eyes: no periorbital swelling, no proptosis external nose and ears are normal NECK: supple, thyroid is not enlarged CHEST WALL: no deformity LUNGS: clear to auscultation BREASTS:  No gynecomastia CV: reg rate and rhythm, no murmur GENITALIA:  Normal male.   MUSCULOSKELETAL: muscle bulk and strength are grossly normal.  no joint swelling is seen  gait is normal and steady EXTEMITIES: no leg edema NEURO: sensation is intact to touch on all 4's SKIN:  Normal texture and temperature.  No rash or suspicious lesion is visible.  Normal male hair distribution.   NODES:  None palpable at the neck PSYCH: alert, well-oriented.  Does not appear anxious nor depressed.     outside test results are reviewed: Testosterone=492 Free testosterone=245  I have reviewed outside records, and summarized: Pt was stable on clomid, but he requested 2nd opinion, and referred here.     Assessment & Plan:  Low testosterone, new to me: well-replaced Fatigue and other sxs.  I told pt these are not thyroid-related.  Patient Instructions  I would be happy to change the clomiphene to a topical gel or injections, but I would stay with the clomiphene if I was you.

## 2020-10-25 NOTE — Patient Instructions (Signed)
I would be happy to change the clomiphene to a topical gel or injections, but I would stay with the clomiphene if I was you.

## 2020-10-26 NOTE — Progress Notes (Signed)
Nurses Visit:  Pt here for his Spravato treatment for treatment resistant depression. He will receive 84 mg (3 of the 28 mg) nasal sprays today. Pt taken to treatment room.  Pt reports he's feeling great after his treatment on 10/13/2020 and felt good over the weekend. Spravato is stored at the doctor's office per FDA regulations in the safe behind another locked door due to being a schedule CIII medicaton. Pt's medication is delivered by [facility] in [place]. After nasal sprays used they are disposed of per FDA regulations. Beginning vital signs taken at 2:15 pm 137/85, 70 pulse. Pt instructed to blow his nose and recline back 45 degrees. He began his first nasal spray approximately 1415, with 5 minutes between next 2 nasal sprays. Pt brings head phones and an eye mask and relaxes in recliner after receiving his treatment. Pt sleeping off and on. He also prefers the lights out overhead. Pt awoken for his 40 minute vital sign check at 1500, B/P 150/86, pulse 66. Pt then resting/sleeping till Dr. Jennelle Human comes in near end of treatment to discuss how he's feeling and his medication. Discharge vital signs at 1605 were 151/93, pulse 59. Vital signs have been stable. Pt is feeling good at discharge with no complaints. Pt contacts his wife to pick him up, they live very close to office. Pt is scheduled this Friday on Tuesday, May 13 th. He will also come twice next week.    LOT [id number] EXPT 2023 SEP

## 2020-10-26 NOTE — Progress Notes (Signed)
Pt here for hisSpravato treatment for treatment resistant depression.He will receive 84 mg (3 of the 28 mg) nasal sprays today. Pt taken to treatment room. Pt reports he's feeling greatafter his treatment on 10/20/2020 and had a good weekend. Spravato is stored at the doctor's office per FDA regulations in the safe behind another locked door due to being a schedule CIII medicaton. Pt's medication is delivered by [facility] in [place]. After nasal sprays used they are disposed of per FDA regulations. Beginning vital signs takenat 2:15 pm142/86,70pulse. Pt instructed to blow his nose and recline back 45 degrees. He began his first nasal spray, with 5 minutes between next 2 nasal sprays. Pt brings head phones and an eye mask and relaxes in reclinerafter receiving his treatment. Pt sleeping after receiving treatment. He also prefers the lights out overhead. Pt awoken for his 40 minute vital sign check at 1510, B/P 138/89, pulse65. Pt then resting/sleeping till Dr. Jennelle Human comes in near end of treatment to discuss how he's feeling and his medication. Discharge vital signs at 1600were 154/92, pulse62. Vital signs have been stable, pt does take b/p medication and his readings have been higher than he normally runs. Pt plans on reaching out to his PCP to discuss his b/p. Pt is feeling good at discharge with no complaints. Pt contacts his wife to pick him up, they live very close to office. Pt is scheduled this Friday, May 20th.    LOT [id number] EXP 2023 SEP

## 2020-10-26 NOTE — Progress Notes (Signed)
Nurses Visit:  Pt here for hisSpravato treatment for treatment resistant depression.He will receive 84 mg (3 of the 28 mg) nasal sprays today. Pt taken to treatment room. Pt reports he's feeling great after his treatment on 10/17/2020. Spravato is stored at the doctor's office per FDA regulations in the safe behind another locked door due to being a schedule CIII medicaton. Pt's medication is delivered by [facility] in [place]. After nasal sprays used they are disposed of per FDA regulations. Beginning vital signs taken at 2:01 pm 142/84, 67 pulse. Pt instructed to blow his nose and recline back 45 degrees. He began his first nasal spray approximately 1401, with 5 minutes between next 2 nasal sprays. Pt brings head phones and an eye mask and relaxes in recliner after receiving his treatment. Pt sleeping after receiving treatment. He also prefers the lights out overhead. Pt awoken for his 40 minute vital sign check at 1500, B/P 132/75, pulse 62. Pt then resting/sleeping till Dr. Jennelle Human comes in near end of treatment to discuss how he's feeling and his medication. Discharge vital signs at 1600 were 154/95, pulse 65. Vital signs have been stable. Pt is feeling good at discharge with no complaints. Pt contacts his wife to pick him up, they live very close to office. Pt is scheduled next week on Tuesday, May 17 th.    LOT [id number] EXP 2023 SEP

## 2020-10-27 ENCOUNTER — Other Ambulatory Visit: Payer: Self-pay

## 2020-10-27 ENCOUNTER — Ambulatory Visit: Payer: 59 | Admitting: Psychiatry

## 2020-10-27 ENCOUNTER — Ambulatory Visit: Payer: 59

## 2020-10-27 ENCOUNTER — Ambulatory Visit (INDEPENDENT_AMBULATORY_CARE_PROVIDER_SITE_OTHER): Payer: 59 | Admitting: Physician Assistant

## 2020-10-27 VITALS — BP 135/80 | HR 60

## 2020-10-27 DIAGNOSIS — F429 Obsessive-compulsive disorder, unspecified: Secondary | ICD-10-CM

## 2020-10-27 DIAGNOSIS — F338 Other recurrent depressive disorders: Secondary | ICD-10-CM | POA: Diagnosis not present

## 2020-10-27 DIAGNOSIS — F411 Generalized anxiety disorder: Secondary | ICD-10-CM | POA: Diagnosis not present

## 2020-10-27 DIAGNOSIS — F339 Major depressive disorder, recurrent, unspecified: Secondary | ICD-10-CM | POA: Diagnosis not present

## 2020-10-27 DIAGNOSIS — F902 Attention-deficit hyperactivity disorder, combined type: Secondary | ICD-10-CM

## 2020-10-27 NOTE — Progress Notes (Signed)
[facility]  Patient ID: Albert [name],  MRN: [id number]  PCP: Elise Benne  Date of Evaluation: 5/202022 Time spent:40 minutes  Chief Complaint:  Chief Complaint    Depression; Follow-up      HISTORY/CURRENT STATUS: HPI f/U Spravato  Here for his sixth treatment of Spravato. Has been receiving treatments twice a week for the past 3 weeks.  Next week he will only have 1 treatment due to going out of town.    He is tolerating the treatment well.  He does have dissociation a few times during the couple of hours after the administration.  The thoughts are not frightening in anyway.  Today he reports feeling really relaxed and kind of detached from himself and all of his sadness and anxiety.  That usually goes away within about 2 hours after he has the Spravato.  After today's treatment he denies headache, visual changes, chest pain, shortness of breath, or nausea.  States he feels really good on the Spravato.  It seems to have met or even exceeded his expectations.  He is able to enjoy things more does not have a sense of darkness. Has more energy and motivation, looks forward to things more, not isolating, not crying, appetite is good and weight is stable. Denies SI/HI.  Anxiety is not much of an issue at all since being on the Spravato. OCD symptoms aren't a problem either. No obsessive thoughts like in the past. Able to focus and get things done in a more timely manner and feels that this Spravato is also helping with the ADD.  Feels that both the Prozac and Spravato are helping all that, as well as the depression.  Work is going well. Sleeps good most all the time. On the nights that he gets the Spravato, he usually has a little trouble sleeping but it's not intolerable.   Individual Medical History/ Review of Systems: Changes? :No    Past medications for mental health diagnoses include: Luvox, Lunesta, Xanax, Wellbutrin, Risperdal, Prozac, Ambien, Deplin,  Abilify, naltrexone, Adderall, Spravato  Allergies: Sulfamethoxazole and Sulfonamide derivatives  Current Medications:  Current Outpatient Medications:  .  clomiPHENE (CLOMID) 50 MG tablet, TAKE 1/2 TABLET BY MOUTH EVERY DAY, Disp: 45 tablet, Rfl: 3 .  Esketamine HCl, 84 MG Dose, (SPRAVATO, 84 MG DOSE,) 28 MG/DEVICE SOPK, Place 84 mg into the nose 2 (two) times a week., Disp: 3 each, Rfl: 0 .  eszopiclone (LUNESTA) 2 MG TABS tablet, Take 1 tablet (2 mg total) by mouth at bedtime as needed for sleep. Take immediately before bedtime, Disp: 30 tablet, Rfl: 5 .  ezetimibe (ZETIA) 10 MG tablet, Take 1 tablet (10 mg total) by mouth daily., Disp: 90 tablet, Rfl: 3 .  FLUoxetine (PROZAC) 40 MG capsule, Take 1 capsule (40 mg total) by mouth daily., Disp: 30 capsule, Rfl: 1 .  LORazepam (ATIVAN) 0.5 MG tablet, Take 0.5-1 tablets (0.25-0.5 mg total) by mouth 2 (two) times daily as needed for anxiety., Disp: 30 tablet, Rfl: 1 .  telmisartan-hydrochlorothiazide (MICARDIS HCT) 80-12.5 MG tablet, Take 1 tablet by mouth daily., Disp: 90 tablet, Rfl: 1 Medication Side Effects: none  Family Medical/ Social History: Changes? No  MENTAL HEALTH EXAM:  Baseline  BP was 144/88 pulse was 58 40 minutes after Spravato administration BP 155/95, pulse 64 2 hours post administration BP was 135/80, pulse 60.  There were no vitals taken for this visit.There is no height or weight on file to calculate BMI.  General Appearance:  Casual and Well Groomed  Eye Contact:  Good  Speech:  Clear and Coherent and Normal Rate  Volume:  Normal  Mood:  Euthymic  Affect:  Appropriate  Thought Process:  Goal Directed and Descriptions of Associations: Circumstantial  Orientation:  Full (Time, Place, and Person)  Thought Content: Logical   Suicidal Thoughts:  No  Homicidal Thoughts:  No  Memory:  WNL  Judgement:  Good  Insight:  Good  Psychomotor Activity:  Normal  Concentration:  Concentration: Good  Recall:  Good  Fund of  Knowledge: Good  Language: Good  Assets:  Desire for Improvement  ADL's:  Intact  Cognition: WNL  Prognosis:  Good    DIAGNOSES:    ICD-10-CM   1. 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 Obsessive-compulsive disorder, unspecified type  F42.9   3. Seasonal affective disorder ([place])  F33.8   4. Generalized anxiety disorder  F41.1   5. Attention deficit hyperactivity disorder (ADHD), combined type  F90.2     Receiving Psychotherapy: No    RECOMMENDATIONS:  PDMP was reviewed. I provided 45 minutes of face-to-face time during this encounter including time spent before and after the visit and records review and charting. He is responding very well to this Spravato so we will continue treatment.  He has now had 3 full weeks of treatment twice a week, and because he will be out of town next week, week #4, he will only receive 1 treatment.  It is recommended to have a full 4 weeks of treatment twice weekly and then go to 1 weekly.  Once he returns, we may need to give Spravato twice a week for another few weeks if the depression has worsened.  He understands. Continue Spravato as noted. Continue Prozac 40 mg, 1 p.o. daily. Continue Ativan 0.5 mg, 1/2-1 p.o. twice daily as needed. Continue Lunesta 2 mg, 1 p.o. nightly as needed sleep. Return on 10/31/2020 for his next treatment.  [name], PA-C

## 2020-10-28 ENCOUNTER — Encounter: Payer: Self-pay | Admitting: Physician Assistant

## 2020-10-28 DIAGNOSIS — R7989 Other specified abnormal findings of blood chemistry: Secondary | ICD-10-CM | POA: Insufficient documentation

## 2020-10-30 ENCOUNTER — Other Ambulatory Visit: Payer: Self-pay

## 2020-10-30 MED ORDER — SPRAVATO (84 MG DOSE) 28 MG/DEVICE NA SOPK: 84.0000 mg | PACK | NASAL | 0 refills | Status: DC

## 2020-10-31 ENCOUNTER — Ambulatory Visit: Payer: 59 | Admitting: Psychiatry

## 2020-10-31 ENCOUNTER — Other Ambulatory Visit: Payer: Self-pay

## 2020-10-31 ENCOUNTER — Ambulatory Visit: Payer: 59

## 2020-10-31 ENCOUNTER — Ambulatory Visit (INDEPENDENT_AMBULATORY_CARE_PROVIDER_SITE_OTHER): Payer: 59 | Admitting: Physician Assistant

## 2020-10-31 VITALS — BP 155/95 | HR 62

## 2020-10-31 DIAGNOSIS — F339 Major depressive disorder, recurrent, unspecified: Secondary | ICD-10-CM | POA: Diagnosis not present

## 2020-10-31 DIAGNOSIS — G4700 Insomnia, unspecified: Secondary | ICD-10-CM

## 2020-10-31 DIAGNOSIS — R030 Elevated blood-pressure reading, without diagnosis of hypertension: Secondary | ICD-10-CM

## 2020-10-31 DIAGNOSIS — F429 Obsessive-compulsive disorder, unspecified: Secondary | ICD-10-CM | POA: Diagnosis not present

## 2020-10-31 DIAGNOSIS — F411 Generalized anxiety disorder: Secondary | ICD-10-CM

## 2020-10-31 DIAGNOSIS — F338 Other recurrent depressive disorders: Secondary | ICD-10-CM

## 2020-10-31 DIAGNOSIS — F902 Attention-deficit hyperactivity disorder, combined type: Secondary | ICD-10-CM

## 2020-11-01 ENCOUNTER — Encounter: Payer: Self-pay | Admitting: Physician Assistant

## 2020-11-01 NOTE — Progress Notes (Signed)
NURSE VISIT: Pt here for hisSpravato treatment for treatment resistant depression.He will receive 84 mg (3 of the 28 mg) nasal sprays today. Pt taken to treatment room. Pt reports he's feeling greatafter his treatment on 10/24/2020 and had a good wee.Spravato is stored at the doctor's office per FDA regulations in the safe behind another locked door due to being a schedule CIII medicaton. Pt's medication is delivered by [facility] in [place]. After nasal sprays used they are disposed of per FDA regulations. Beginning vital signs takenat 2:10pm144/88,58pulse. Pt instructed to blow his nose and recline back 45 degrees. He began his first nasal spray, with 5 minutes between next 2 nasal sprays. Pt brings head phones and an eye mask and relaxes in reclinerafter receiving his treatment. Pt sleepingafter receiving treatment. He also prefers the lights out overhead. Pt awoken for his 40 minute vital sign check at 1455, B/P 155/95, pulse64. Pt then resting/sleeping till [name], PA-C comes in near end of treatment to discuss how he's feeling and his medication. Discharge vital signs at 1605were 135/80, pulse60. Vital signs have been stable, pt does take b/p medication and his readings have been higher than he normally runs. Pt instructed to check his vital signs over weekend at random times. Pt is feeling good at discharge with no complaints. Pt contacts his wife to pick him up, they live very close to office. Pt is schedulednext Tuesday, May24th.    LOT [id number] EXP 2023 SEP

## 2020-11-01 NOTE — Progress Notes (Signed)
[facility]  Patient ID: Albert [name],  MRN: [id number]  PCP: Albert [name]   Date of Evaluation: 10/31/2020 Time spent:180  Chief Complaint:  Chief Complaint    Depression      HISTORY/CURRENT STATUS: HPI for Spravato #7.  This is his fourth week of Spravato and he has responded very well to it.  Before the administration of Spravato he reports he has felt remarkably better with symptoms of depression overall.  The past few days he has felt a little "down" but it is raining and gray and that always affects him in a negative way.  He does use a therapy lamp which does help some.   Since being on the Spravato, he has had more of a desire to do things, energy and motivation have been good, he does not have to force himself to do things that he once did.  He is not isolating.  He still works at home though.  Does not cry easily. He often has a trouble sleeping the night after having Spravato.  Other than that, he sleeps well.  Denies suicidal or homicidal thoughts.  Obsessive compulsive symptoms are very well treated.  He is not having obsessive thoughts hardly ever.  No complaints of generalized anxiety or panic attacks either.  Also feels like he can concentrate more since being on the Spravato.  During the 2 hours while here after the administration of Spravato, he does have dissociation.  Nothing scary. After the treatment today, he denies headache, changes in vision, chest pain or shortness of breath, no nausea or vomiting.  Patient denies increased energy with decreased need for sleep, no increased talkativeness, no racing thoughts, no impulsivity or risky behaviors, no increased spending, no increased libido, no grandiosity, no increased irritability or anger, no paranoia, and no hallucinations.  Denies dizziness, syncope, seizures, numbness, tingling, tremor, tics, unsteady gait, slurred speech, confusion. Denies muscle or joint pain, stiffness, or  dystonia.  Individual Medical History/ Review of Systems: Changes? :No   Past medications for mental health diagnoses include: Luvox, Lunesta, Xanax, Wellbutrin, Risperdal, Prozac, Ambien, Deplin, Abilify, naltrexone, Adderall, Spravato  Allergies: Sulfamethoxazole and Sulfonamide derivatives  Current Medications:  Current Outpatient Medications:  .  clomiPHENE (CLOMID) 50 MG tablet, TAKE 1/2 TABLET BY MOUTH EVERY DAY, Disp: 45 tablet, Rfl: 3 .  Esketamine HCl, 84 MG Dose, (SPRAVATO, 84 MG DOSE,) 28 MG/DEVICE SOPK, Place 84 mg into the nose 2 (two) times a week., Disp: 3 each, Rfl: 0 .  eszopiclone (LUNESTA) 2 MG TABS tablet, Take 1 tablet (2 mg total) by mouth at bedtime as needed for sleep. Take immediately before bedtime, Disp: 30 tablet, Rfl: 5 .  ezetimibe (ZETIA) 10 MG tablet, Take 1 tablet (10 mg total) by mouth daily., Disp: 90 tablet, Rfl: 3 .  FLUoxetine (PROZAC) 40 MG capsule, Take 1 capsule (40 mg total) by mouth daily., Disp: 30 capsule, Rfl: 1 .  LORazepam (ATIVAN) 0.5 MG tablet, Take 0.5-1 tablets (0.25-0.5 mg total) by mouth 2 (two) times daily as needed for anxiety., Disp: 30 tablet, Rfl: 1 .  telmisartan-hydrochlorothiazide (MICARDIS HCT) 80-12.5 MG tablet, Take 1 tablet by mouth daily., Disp: 90 tablet, Rfl: 1 Medication Side Effects: none  Family Medical/ Social History: Changes? No   MENTAL HEALTH EXAM:  BP before Spravato was 149/95 with a pulse 67. 40 minutes after administration of Spravato BP was 150/91, pulse 63. 2 hours after administration BP was 155/95, pulse 63.  There were no vitals  taken for this visit.There is no height or weight on file to calculate BMI.  General Appearance: Casual and Well Groomed  Eye Contact:  Good  Speech:  Clear and Coherent and Normal Rate  Volume:  Normal  Mood:  Euthymic  Affect:  Congruent  Thought Process:  Goal Directed and Descriptions of Associations: Circumstantial  Orientation:  Full (Time, Place, and Person)   Thought Content: Logical   Suicidal Thoughts:  No  Homicidal Thoughts:  No  Memory:  WNL  Judgement:  Good  Insight:  Good  Psychomotor Activity:  Normal  Concentration:  Concentration: Good  Recall:  Good  Fund of Knowledge: Good  Language: Good  Assets:  Desire for Improvement  ADL's:  Intact  Cognition: WNL  Prognosis:  Good    DIAGNOSES:    ICD-10-CM   1. 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 Obsessive-compulsive disorder, unspecified type  F42.9   3. Seasonal affective disorder ([facility])  F33.8   4. Insomnia, unspecified type  G47.00   5. Generalized anxiety disorder  F41.1   6. Attention deficit hyperactivity disorder (ADHD), combined type  F90.2   7. Elevated blood pressure reading  R03.0     Receiving Psychotherapy: No    RECOMMENDATIONS:  PDMP was reviewed. I provided a total of 3 hours of face-to-face time during this encounter, including time spent before and after the visit in records review, medical decision making, and charting. Albert [name] is responding very well so we will continue the treatment.  He will be gone for the next 10 days or so and will not have any treatment during that time.  When he returns, we might need to do a few weeks of Spravato twice weekly but we will see how he is doing. Patient told he needs to get blood pressure checked 2-3 times per week, keep a log of that and take that into his PCP for evaluation.  I do not think the hypertension is of any concern, however it does need to be checked.  He verbalizes understanding and has mentioned several times before that his blood pressure is always normal outside of our office. Continue Spravato as noted above. Continue Prozac 40 mg, 1 p.o. daily. Continue Lunesta 2 mg, 1 p.o. nightly as needed.  Continue Ativan 0.5 mg, 1 p.o. twice daily as needed. Return in 2 weeks.  [name], PA-C

## 2020-11-02 NOTE — Progress Notes (Signed)
NURSE VISIT: Pt here for hisSpravato treatment for treatment resistant depression.He will receive 84 mg (3 of the 28 mg) nasal sprays today. Pt taken to treatment room. Pt reports he's feeling goodafter his treatment on 10/27/2020.Spravato is stored at the doctor's office per FDA regulations in the safe behind another locked door due to being a schedule CIII medicaton. Pt's medication is delivered by [facility] in [place]. After nasal sprays used they are disposed of per FDA regulations. Beginning vital signs takenat 2:05pm149/95,67pulse. Pt instructed to blow his nose and recline back 45 degrees. He began his first nasal spray, with 5 minutes between next 2 nasal sprays. Pt brings head phones and an eye mask and relaxes in reclinerafter receiving his treatment. Pt sleepingafter receiving treatment. He also prefers the lights out overhead. Pt awoken for his 40 minute vital sign check at 1455, B/P 150/91, pulse63. Pt then resting/sleeping till [name], PA-C comes in near end of treatment to discuss his care and treatments.Discharge vital signs at 1600were 155/95, pulse62. Vital signs remain a bit elevated and Teresa aware. Pt instructed to check his vital signs over weekend at random times.Pt is feeling good at discharge with no complaints. Pt contacts his wife to pick him up, they live very close to office. Pt will be out of town the next 2 weeks, so next scheduled apt is Tuesday, June 7th.   LOT [id number] EXP 2023 SEP  Pt's prior authorization for Spravato is 09/15/2020-12/15/2020. An AIMS will need to be completed prior to renewal.

## 2020-11-04 ENCOUNTER — Other Ambulatory Visit: Payer: Self-pay | Admitting: Physician Assistant

## 2020-11-14 ENCOUNTER — Ambulatory Visit (INDEPENDENT_AMBULATORY_CARE_PROVIDER_SITE_OTHER): Payer: 59 | Admitting: Physician Assistant

## 2020-11-14 ENCOUNTER — Other Ambulatory Visit: Payer: Self-pay

## 2020-11-14 ENCOUNTER — Ambulatory Visit: Payer: 59

## 2020-11-14 VITALS — BP 160/95 | HR 59

## 2020-11-14 DIAGNOSIS — F411 Generalized anxiety disorder: Secondary | ICD-10-CM | POA: Diagnosis not present

## 2020-11-14 DIAGNOSIS — F338 Other recurrent depressive disorders: Secondary | ICD-10-CM | POA: Diagnosis not present

## 2020-11-14 DIAGNOSIS — F429 Obsessive-compulsive disorder, unspecified: Secondary | ICD-10-CM

## 2020-11-14 DIAGNOSIS — F339 Major depressive disorder, recurrent, unspecified: Secondary | ICD-10-CM

## 2020-11-14 DIAGNOSIS — F902 Attention-deficit hyperactivity disorder, combined type: Secondary | ICD-10-CM

## 2020-11-14 DIAGNOSIS — G4700 Insomnia, unspecified: Secondary | ICD-10-CM

## 2020-11-14 MED ORDER — TELMISARTAN-HCTZ 80-12.5 MG PO TABS: 1.0000 | ORAL_TABLET | Freq: Every day | ORAL | 1 refills | Status: DC

## 2020-11-14 MED ORDER — SPRAVATO (84 MG DOSE) 28 MG/DEVICE NA SOPK: 84.0000 mg | PACK | NASAL | 0 refills | Status: DC

## 2020-11-14 NOTE — Progress Notes (Signed)
[facility]  Patient ID: Albert [name],  MRN: [id number]  PCP: Esperanza Richters, PA-C   Date of Evaluation: 11/14/2020 Time spent:45 minutes  Chief Complaint:  Chief Complaint   Depression; Other     HISTORY/CURRENT STATUS: HPI for Spravato #8  We skipped the Spravato treatment last week, he was out of town.  It has been 14 days since his last treatment.  Today is #8.    He was in [place] visiting family last week.  Had a good time but was able to notice the depression starting back up at 7 days after treatment #7.  Since then he has not felt like doing anything for fun at all, energy and motivation are low and he does have to force himself to get up and work.  He still works from home.  Does not cry easily.  Most of the time sleeps well but historically the night of Spravato treatment he has trouble sleeping.  No suicidal or homicidal thoughts.  OCD symptoms are well controlled.  He does have ruminating thoughts at times but he is able to push through them and not get stuck in a loop of obsessions.  No compulsive actions to speak of.  Anxiety is controlled with the Spravato as well.  Patient denies increased energy with decreased need for sleep, no increased talkativeness, no racing thoughts, no impulsivity or risky behaviors, no increased spending, no increased libido, no grandiosity, no increased irritability or anger, no paranoia, and no hallucinations.  During the 2 hours while here for observation after the administration of Spravato, he does have dissociation.  Nothing that is really frightening.  It wears off by the time he is able to leave after the full 2 hour time of observation.  Review of Systems  Constitutional: Negative.   HENT: Negative.    Eyes: Negative.   Respiratory: Negative.    Cardiovascular: Negative.   Gastrointestinal: Negative.   Genitourinary: Negative.   Musculoskeletal:  Positive for joint pain.       Pain in right big toe, no know injury.  Has appt w/ ortho for eval tomorrow.  Skin: Negative.   Neurological:  Negative for dizziness, tingling, tremors, sensory change, speech change, focal weakness, seizures, loss of consciousness, weakness and headaches.  Endo/Heme/Allergies: Negative.   Psychiatric/Behavioral:  Positive for depression. Negative for hallucinations, memory loss, substance abuse and suicidal ideas. The patient is not nervous/anxious and does not have insomnia.    See MADRS score.   Individual Medical History/ Review of Systems: Changes? no  Past medications for mental health diagnoses include: Luvox, Lunesta, Xanax, Wellbutrin, Risperdal, Prozac, Ambien, Deplin, Abilify, naltrexone, Adderall, Spravato  Allergies: Sulfamethoxazole and Sulfonamide derivatives  Current Medications:  Current Outpatient Medications:    clomiPHENE (CLOMID) 50 MG tablet, TAKE 1/2 TABLET BY MOUTH EVERY DAY, Disp: 45 tablet, Rfl: 3   colchicine 0.6 MG tablet, Take one bid prn, Disp: 20 tablet, Rfl: 0   Esketamine HCl, 84 MG Dose, (SPRAVATO, 84 MG DOSE,) 28 MG/DEVICE SOPK, Place 84 mg into the nose 2 (two) times a week., Disp: 3 each, Rfl: 0   eszopiclone (LUNESTA) 2 MG TABS tablet, Take 1 tablet (2 mg total) by mouth at bedtime as needed for sleep. Take immediately before bedtime, Disp: 30 tablet, Rfl: 5   ezetimibe (ZETIA) 10 MG tablet, Take 1 tablet (10 mg total) by mouth daily., Disp: 90 tablet, Rfl: 3   FLUoxetine (PROZAC) 40 MG capsule, Take 1 capsule (40 mg total) by mouth  daily., Disp: 30 capsule, Rfl: 1   LORazepam (ATIVAN) 0.5 MG tablet, Take 0.5-1 tablets (0.25-0.5 mg total) by mouth 2 (two) times daily as needed for anxiety., Disp: 30 tablet, Rfl: 1   telmisartan-hydrochlorothiazide (MICARDIS HCT) 80-12.5 MG tablet, Take 1 tablet by mouth daily., Disp: 90 tablet, Rfl: 1 Medication Side Effects: none  Family Medical/ Social History: Changes? No   MENTAL HEALTH EXAM:  BP before Spravato was 146/87, pulse 80 40 minutes  after administration of Spravato 141/87, pulse 62 2 hours after administration BP was 160/95, pulse 59  There were no vitals taken for this visit.There is no height or weight on file to calculate BMI.  General Appearance: Casual and Well Groomed  Eye Contact:  Good  Speech:  Clear and Coherent and Normal Rate  Volume:  Normal  Mood:  Euthymic  Affect:  Congruent  Thought Process:  Goal Directed and Descriptions of Associations: Circumstantial  Orientation:  Full (Time, Place, and Person)  Thought Content: Logical   Suicidal Thoughts:  No  Homicidal Thoughts:  No  Memory:  WNL  Judgement:  Good  Insight:  Good  Psychomotor Activity:   limping  Concentration:  Concentration: Good  Recall:  Good  Fund of Knowledge: Good  Language: Good  Assets:  Desire for Improvement  ADL's:  Intact  Cognition: WNL  Prognosis:  Good    DIAGNOSES:    ICD-10-CM   1. 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 Obsessive-compulsive disorder, unspecified type  F42.9     3. Seasonal affective disorder ([facility])  F33.8     4. Generalized anxiety disorder  F41.1     5. Attention deficit hyperactivity disorder (ADHD), combined type  F90.2     6. Insomnia, unspecified type  G47.00       Receiving Psychotherapy: No    RECOMMENDATIONS:  PDMP was reviewed. I provided a total of 45 minutes of face-to-face time during this encounter, including time spent before and after the visit in records review, complex medical decision making, and charting. Leng is still responding very well to this Spravato but has had a little setback by not having at least 1 treatment in the past 14 days.  After the 4-week mark we can decrease to weekly treatments, however I would recommend having 1 more treatment this week and reevaluate, but probably will go to weekly treatments after that.  Again, this is up in the air.   Again discussed his blood pressure.  He has an appointment to see his PCP next  week to discuss this.   Continue Spravato as noted above. Continue Prozac 40 mg, 1 p.o. daily. Continue Lunesta 2 mg, 1 p.o. nightly as needed.  Continue Ativan 0.5 mg, 1 p.o. twice daily as needed. Return in 3 days.  [name], PA-C

## 2020-11-15 ENCOUNTER — Ambulatory Visit: Payer: Self-pay

## 2020-11-15 ENCOUNTER — Encounter: Payer: Self-pay | Admitting: Orthopaedic Surgery

## 2020-11-15 ENCOUNTER — Ambulatory Visit: Payer: 59 | Admitting: Orthopaedic Surgery

## 2020-11-15 VITALS — BP 146/84 | HR 69 | Ht 72.0 in | Wt 195.0 lb

## 2020-11-15 DIAGNOSIS — M79671 Pain in right foot: Secondary | ICD-10-CM

## 2020-11-15 MED ORDER — COLCHICINE 0.6 MG PO TABS: ORAL_TABLET | ORAL | 0 refills | Status: DC

## 2020-11-15 NOTE — Progress Notes (Signed)
Office Visit Note   Patient: Albert [name]           Date of Birth: 07-Jun-1976           MRN: [id number] Visit Date: 11/15/2020              Requested by: Esperanza Richters, PA-C [address] [place],  [place] [address] PCP: Esperanza Richters, PA-C   Assessment & Plan: Visit Diagnoses:  1. Pain in right foot     Plan: Patient does not have enough swelling first MTP joint consider aspiration today.  Likely diagnosis is gout.  We will check a uric acid.  Colchicine prescribed and take 1 p.o. twice daily for few days.  I will call him with uric acid results.  He asked about allopurinol this is actually his first episode that would suggest possible gout.  We will make a determination about possible treatment based on his uric acid findings.  Follow-Up Instructions: Return in about 3 weeks (around 12/06/2020).   Orders:  Orders Placed This Encounter  Procedures  . XR Foot Complete Right   Meds ordered this encounter  Medications  . colchicine 0.6 MG tablet    Sig: Take one bid prn    Dispense:  20 tablet    Refill:  0      Procedures: No procedures performed   Clinical Data: No additional findings.   Subjective: Chief Complaint  Patient presents with  . Right Foot - Pain    HPI 45 year old male presents with right foot pain and swelling that started last Thursday.  He had some pain on the bottom of his foot and then it localized more to the first MTP joint where he said swelling warmth erythema.  Patient has a brother with gout on allopurinol and colchicine in the past.  Patient took some ibuprofen states has been somewhat better.  He has had great difficulty walking on his foot mostly using the lateral aspect of his foot.  Review of Systems former smoker positive for hypertension.  Past history of lumbar foraminal stenosis significantly improved.  All systems noncontributory to HPI.   Objective: Vital Signs: BP (!) 146/84   Pulse 69   Ht 6' (1.829 m)    Wt 195 lb (88.5 kg)   BMI 26.45 kg/m   Physical Exam Constitutional:      Appearance: He is well-developed.  HENT:     Head: Normocephalic and atraumatic.  Eyes:     Pupils: Pupils are equal, round, and reactive to light.  Neck:     Thyroid: No thyromegaly.     Trachea: No tracheal deviation.  Cardiovascular:     Rate and Rhythm: Normal rate.  Pulmonary:     Effort: Pulmonary effort is normal.     Breath sounds: No wheezing.  Abdominal:     General: Bowel sounds are normal.     Palpations: Abdomen is soft.  Skin:    General: Skin is warm and dry.     Capillary Refill: Capillary refill takes less than 2 seconds.  Neurological:     Mental Status: He is alert and oriented to person, place, and time.  Psychiatric:        Behavior: Behavior normal.        Thought Content: Thought content normal.        Judgment: Judgment normal.     Ortho Exam patient has increased warmth first MTP joint swelling around the MTP joint minimal dorsal  foot swelling.  No plantar foot lesions.  Peroneals posterior tib are normal.  Patient ambulates with more weight laterally on his foot done left first and TP joint.  EHL and FHL is intact.  Specialty Comments:  No specialty comments available.  Imaging: No results found.   PMFS History: Patient Active Problem List   Diagnosis Date Noted  . Low testosterone 10/28/2020  . Lumbar foraminal stenosis 12/16/2019  . Essential hypertension 08/04/2018  . OCD (obsessive compulsive disorder) 05/25/2018  . Insomnia 05/25/2018  . Cardiac risk counseling 07/20/2017  . Closed nondisplaced fracture of neck of fifth metacarpal bone of right hand 07/10/2016  . Attention deficit hyperactivity disorder (ADHD) 06/19/2016  . Thrombosed external hemorrhoid 10/06/2015  . CONTACT DERMATITIS&OTHER ECZEMA DUE UNSPEC CAUSE 10/06/2009  . NECK PAIN, ACUTE 09/13/2009  . TOBACCO USE, QUIT 03/16/2009  . DEPRESSION 02/20/2009   Past Medical History:  Diagnosis Date   . Attention deficit hyperactivity disorder (ADHD) 06/19/2016  . Closed nondisplaced fracture of neck of fifth metacarpal bone of right hand 07/10/2016  . CONTACT DERMATITIS&OTHER ECZEMA DUE UNSPEC CAUSE 10/06/2009   Qualifier: Diagnosis of  By: Yetta Barre MD, Bernadene Bell.   . Depression   . DEPRESSION 02/20/2009   Qualifier: Diagnosis of  By: Yetta Barre MD, Bernadene Bell.   . Hyperlipidemia   . NECK PAIN, ACUTE 09/13/2009   Qualifier: Diagnosis of  By: Yetta Barre MD, Bernadene Bell.   . Thrombosed external hemorrhoid 10/06/2015   Symptoms and exam consistent with thrombosed external hemorrhoid. Patient declines incise and drain at this time. Treat conservatively with hydrocortisone rectal cream and Tylenol 3 as needed for discomfort. Recommend sitz bath. Follow-up if symptoms worsen or do not improve or would like to seek interventional procedure.   . TOBACCO USE, QUIT 03/16/2009   Qualifier: Diagnosis of  By: Tora Perches      Family History  Problem Relation Age of Onset  . Healthy Mother   . Hyperlipidemia Paternal Grandfather   . Heart disease Paternal Grandfather   . Heart failure Paternal Grandfather   . Other Neg Hx        low testosterone    Past Surgical History:  Procedure Laterality Date  . HERNIA REPAIR     Social History   Occupational History  . Occupation: Sales  Tobacco Use  . Smoking status: Never Smoker  . Smokeless tobacco: Never Used  . Tobacco comment: socially smoked years ago.  Vaping Use  . Vaping Use: Never used  Substance and Sexual Activity  . Alcohol use: Yes    Alcohol/week: 7.0 standard drinks    Types: 3 Glasses of wine, 4 Standard drinks or equivalent per week  . Drug use: No  . Sexual activity: Yes

## 2020-11-16 LAB — URIC ACID: Uric Acid, Serum: 7.7 mg/dL (ref 4.0–8.0)

## 2020-11-17 ENCOUNTER — Ambulatory Visit: Payer: 59

## 2020-11-17 ENCOUNTER — Encounter: Payer: Self-pay | Admitting: Physician Assistant

## 2020-11-17 ENCOUNTER — Other Ambulatory Visit: Payer: Self-pay

## 2020-11-17 ENCOUNTER — Ambulatory Visit (INDEPENDENT_AMBULATORY_CARE_PROVIDER_SITE_OTHER): Payer: 59 | Admitting: Physician Assistant

## 2020-11-17 ENCOUNTER — Ambulatory Visit: Payer: 59 | Admitting: Medical

## 2020-11-17 VITALS — BP 145/87 | HR 67

## 2020-11-17 VITALS — BP 129/74 | HR 65 | Resp 20 | Ht 72.0 in | Wt 195.0 lb

## 2020-11-17 VITALS — BP 143/92 | HR 58

## 2020-11-17 DIAGNOSIS — I10 Essential (primary) hypertension: Secondary | ICD-10-CM | POA: Diagnosis not present

## 2020-11-17 DIAGNOSIS — E785 Hyperlipidemia, unspecified: Secondary | ICD-10-CM

## 2020-11-17 DIAGNOSIS — E1169 Type 2 diabetes mellitus with other specified complication: Secondary | ICD-10-CM

## 2020-11-17 DIAGNOSIS — F338 Other recurrent depressive disorders: Secondary | ICD-10-CM

## 2020-11-17 DIAGNOSIS — F339 Major depressive disorder, recurrent, unspecified: Secondary | ICD-10-CM

## 2020-11-17 DIAGNOSIS — F411 Generalized anxiety disorder: Secondary | ICD-10-CM | POA: Insufficient documentation

## 2020-11-17 DIAGNOSIS — G4700 Insomnia, unspecified: Secondary | ICD-10-CM | POA: Diagnosis not present

## 2020-11-17 DIAGNOSIS — F429 Obsessive-compulsive disorder, unspecified: Secondary | ICD-10-CM

## 2020-11-17 DIAGNOSIS — F902 Attention-deficit hyperactivity disorder, combined type: Secondary | ICD-10-CM

## 2020-11-17 DIAGNOSIS — R030 Elevated blood-pressure reading, without diagnosis of hypertension: Secondary | ICD-10-CM

## 2020-11-17 LAB — COMPREHENSIVE METABOLIC PANEL
ALT: 32 U/L (ref 0–53)
AST: 22 U/L (ref 0–37)
Albumin: 4.6 g/dL (ref 3.5–5.2)
Alkaline Phosphatase: 65 U/L (ref 39–117)
BUN: 17 mg/dL (ref 6–23)
CO2: 29 mEq/L (ref 19–32)
Calcium: 9.8 mg/dL (ref 8.4–10.5)
Chloride: 101 mEq/L (ref 96–112)
Creatinine, Ser: 1.08 mg/dL (ref 0.40–1.50)
GFR: 83.47 mL/min (ref >60.00)
Glucose, Bld: 98 mg/dL (ref 70–99)
Potassium: 4.4 mEq/L (ref 3.5–5.1)
Sodium: 139 mEq/L (ref 135–145)
Total Bilirubin: 0.4 mg/dL (ref 0.2–1.2)
Total Protein: 7.4 g/dL (ref 6.0–8.3)

## 2020-11-17 LAB — LIPID PANEL
Cholesterol: 217 mg/dL — ABNORMAL HIGH (ref 0–200)
HDL: 39.8 mg/dL (ref >39.00)
LDL Cholesterol: 155 mg/dL — ABNORMAL HIGH (ref 0–99)
NonHDL: 176.99
Total CHOL/HDL Ratio: 5
Triglycerides: 109 mg/dL (ref 0.0–149.0)
VLDL: 21.8 mg/dL (ref 0.0–40.0)

## 2020-11-17 NOTE — Progress Notes (Signed)
[facility]  Patient ID: Albert [name],  MRN: [id number]  PCP: Esperanza Richters, PA-C   Date of Evaluation: 11/17/2020 Time spent:45 minutes  Chief Complaint:  Chief Complaint   Depression; Other      HISTORY/CURRENT STATUS: HPI for Spravato #9.  At his last visit 11/14/2020 he received Spravato #8 after a 2-week hiatus due to him being out of town.  Reports feeling better with depression almost immediately.  He is responding very well to the Spravato.  He had noticed while on vacation that the Spravato seemed to wear off in about 1 week.  His last treatment was 3 days ago.  Able to enjoy things.  Energy and motivation are good.  Not isolating.  Focus and attention are pretty good for him.  Appetite is normal and weight is stable.  No suicidal or homicidal thoughts.  OCD symptoms are well controlled.  Doesn't have obsessive thoughts like he used to.  No compulsive thoughts. Spravato seems to help generalized anxiety.  He saw his PCP within the past few days, dx with gout. Feels better with treatment. Also discussed his elevated bp here.  PCP isn't overly concerned, pt is treated well.   Patient denies increased energy with decreased need for sleep, no increased talkativeness, no racing thoughts, no impulsivity or risky behaviors, no increased spending, no increased libido, no grandiosity, no increased irritability or anger, no paranoia, and no hallucinations.  Review of Systems  Constitutional: Negative.   HENT: Negative.    Eyes: Negative.   Respiratory: Negative.    Cardiovascular: Negative.   Gastrointestinal: Negative.   Genitourinary: Negative.   Musculoskeletal: Negative.   Skin: Negative.   Neurological: Negative.   Endo/Heme/Allergies: Negative.   Psychiatric/Behavioral:         See HPI.    Individual Medical History/ Review of Systems: Changes? no  Past medications for mental health diagnoses include: Luvox, Lunesta, Xanax, Wellbutrin, Risperdal,  Prozac, Ambien, Deplin, Abilify, naltrexone, Adderall, Spravato  Allergies: Sulfamethoxazole and Sulfonamide derivatives  Current Medications:  Current Outpatient Medications:    clomiPHENE (CLOMID) 50 MG tablet, TAKE 1/2 TABLET BY MOUTH EVERY DAY, Disp: 45 tablet, Rfl: 3   colchicine 0.6 MG tablet, Take one bid prn, Disp: 20 tablet, Rfl: 0   Esketamine HCl, 84 MG Dose, (SPRAVATO, 84 MG DOSE,) 28 MG/DEVICE SOPK, Place 84 mg into the nose 2 (two) times a week., Disp: 3 each, Rfl: 0   eszopiclone (LUNESTA) 2 MG TABS tablet, Take 1 tablet (2 mg total) by mouth at bedtime as needed for sleep. Take immediately before bedtime, Disp: 30 tablet, Rfl: 5   ezetimibe (ZETIA) 10 MG tablet, Take 1 tablet (10 mg total) by mouth daily., Disp: 90 tablet, Rfl: 3   FLUoxetine (PROZAC) 40 MG capsule, Take 1 capsule (40 mg total) by mouth daily., Disp: 30 capsule, Rfl: 1   LORazepam (ATIVAN) 0.5 MG tablet, Take 0.5-1 tablets (0.25-0.5 mg total) by mouth 2 (two) times daily as needed for anxiety., Disp: 30 tablet, Rfl: 1   telmisartan-hydrochlorothiazide (MICARDIS HCT) 80-12.5 MG tablet, Take 1 tablet by mouth daily., Disp: 90 tablet, Rfl: 1 Medication Side Effects: none  Family Medical/ Social History: Changes? No   MENTAL HEALTH EXAM:  BP before Spravato was BP 145/87, pulse 67 40 minutes after administration of Spravato, BP 144/85 pulse 62 2 hours after administration BP 143/92 pulse 58   Blood pressure (!) 145/87, pulse 67.There is no height or weight on file to calculate BMI.  General  Appearance: Casual and Well Groomed  Eye Contact:  Good  Speech:  Clear and Coherent and Normal Rate  Volume:  Normal  Mood:  Euthymic  Affect:  Congruent  Thought Process:  Goal Directed and Descriptions of Associations: Circumstantial  Orientation:  Full (Time, Place, and Person)  Thought Content: Logical   Suicidal Thoughts:  No  Homicidal Thoughts:  No  Memory:  WNL  Judgement:  Good  Insight:  Good   Psychomotor Activity:  Normal  Concentration:  Concentration: Good  Recall:  Good  Fund of Knowledge: Good  Language: Good  Assets:  Desire for Improvement  ADL's:  Intact  Cognition: WNL  Prognosis:  Good    DIAGNOSES:    ICD-10-CM   1. 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 Obsessive-compulsive disorder, unspecified type  F42.9     3. Seasonal affective disorder (HCC)  F33.8     4. Attention deficit hyperactivity disorder (ADHD), combined type  F90.2     5. Insomnia, unspecified type  G47.00     6. Elevated blood pressure reading  R03.0        Receiving Psychotherapy: No    RECOMMENDATIONS:  PDMP was reviewed. I provided 45 minutes of face to face time during this encounter, including time spent before and after the visit in records review, medical decision making, counseling concerning treatment, and charting.  I am glad to see him responding so well! Since he missed a treatment for 10 days, I recommend  Spravato twice a week for another week, and re-assess then. He agrees.  Continue Spravato as noted above. Continue Prozac 40 mg, 1 p.o. daily. Continue Lunesta 2 mg, 1 p.o. nightly as needed.  Continue Ativan 0.5 mg, 1 p.o. twice daily as needed. Return in 3 days.  [name], PA-C

## 2020-11-17 NOTE — Progress Notes (Signed)
Subjective:    Patient ID: Albert [name], male    DOB: [birth year], 45 y.o.   MRN: [id number]  HPI Pt has htn hx. Bp controlled today. On micardis.   Anxiety and mood well controlled with prozac.  Recent gout dx by ortho. Pt states symptoms better with colchicine.(Rt great toe)  High cholesterol with fh of cad. Pt never took zetia.  He is on krill oil and red yeast rice with co-q 10.  Insomna resolved. No on meds presently for sleep. In past had been on lunesta.     Review of Systems  Constitutional:  Negative for chills, fatigue, fever and unexpected weight change.  HENT:  Negative for congestion.   Respiratory:  Negative for cough, chest tightness, shortness of breath and wheezing.   Cardiovascular:  Negative for chest pain and palpitations.  Gastrointestinal:  Negative for abdominal pain, blood in stool, diarrhea and nausea.  Genitourinary:  Negative for dysuria.  Musculoskeletal:  Negative for back pain.  Neurological:  Negative for dizziness, light-headedness and headaches.  Hematological:  Negative for adenopathy. Does not bruise/bleed easily.  Psychiatric/Behavioral:  Positive for dysphoric mood. Negative for behavioral problems, confusion, sleep disturbance and suicidal ideas. The patient is nervous/anxious.        Mood and anxiety controlled.    Past Medical History:  Diagnosis Date   Attention deficit hyperactivity disorder (ADHD) 06/19/2016   Closed nondisplaced fracture of neck of fifth metacarpal bone of right hand 07/10/2016   CONTACT DERMATITIS&OTHER ECZEMA DUE UNSPEC CAUSE 10/06/2009   Qualifier: Diagnosis of  By: Yetta Barre MD, Bernadene Bell.    Depression    DEPRESSION 02/20/2009   Qualifier: Diagnosis of  By: Yetta Barre MD, Bernadene Bell.    Hyperlipidemia    NECK PAIN, ACUTE 09/13/2009   Qualifier: Diagnosis of  By: Yetta Barre MD, Bernadene Bell.    Thrombosed external hemorrhoid 10/06/2015   Symptoms and exam consistent with thrombosed external hemorrhoid. Patient declines incise and  drain at this time. Treat conservatively with hydrocortisone rectal cream and Tylenol 3 as needed for discomfort. Recommend sitz bath. Follow-up if symptoms worsen or do not improve or would like to seek interventional procedure.    TOBACCO USE, QUIT 03/16/2009   Qualifier: Diagnosis of  By: Tora Perches       Social History   Socioeconomic History   Marital status: Married    Spouse name: Not on file   Number of children: 1   Years of education: 16   Highest education level: Not on file  Occupational History   Occupation: Sales  Tobacco Use   Smoking status: Never   Smokeless tobacco: Never   Tobacco comments:    socially smoked years ago.  Vaping Use   Vaping Use: Never used  Substance and Sexual Activity   Alcohol use: Yes    Alcohol/week: 7.0 standard drinks    Types: 3 Glasses of wine, 4 Standard drinks or equivalent per week   Drug use: No   Sexual activity: Yes  Other Topics Concern   Not on file  Social History Narrative   Fun: Exercise, play music, sports   Social Determinants of Health   Financial Resource Strain: Not on file  Food Insecurity: Not on file  Transportation Needs: Not on file  Physical Activity: Not on file  Stress: Not on file  Social Connections: Not on file  Intimate Partner Violence: Not on file    Past Surgical History:  Procedure Laterality Date   HERNIA REPAIR  Family History  Problem Relation Age of Onset   Healthy Mother    Hyperlipidemia Paternal Grandfather    Heart disease Paternal Grandfather    Heart failure Paternal Grandfather    Other Neg Hx        low testosterone    Allergies  Allergen Reactions   Sulfamethoxazole     Other reaction(s): Arthralgias (intolerance)   Sulfonamide Derivatives     REACTION: Rash Stiff joints    Current Outpatient Medications on File Prior to Visit  Medication Sig Dispense Refill   clomiPHENE (CLOMID) 50 MG tablet TAKE 1/2 TABLET BY MOUTH EVERY DAY 45 tablet 3   colchicine  0.6 MG tablet Take one bid prn 20 tablet 0   Esketamine HCl, 84 MG Dose, (SPRAVATO, 84 MG DOSE,) 28 MG/DEVICE SOPK Place 84 mg into the nose 2 (two) times a week. 3 each 0   eszopiclone (LUNESTA) 2 MG TABS tablet Take 1 tablet (2 mg total) by mouth at bedtime as needed for sleep. Take immediately before bedtime 30 tablet 5   ezetimibe (ZETIA) 10 MG tablet Take 1 tablet (10 mg total) by mouth daily. 90 tablet 3   FLUoxetine (PROZAC) 40 MG capsule Take 1 capsule (40 mg total) by mouth daily. 30 capsule 1   LORazepam (ATIVAN) 0.5 MG tablet Take 0.5-1 tablets (0.25-0.5 mg total) by mouth 2 (two) times daily as needed for anxiety. 30 tablet 1   telmisartan-hydrochlorothiazide (MICARDIS HCT) 80-12.5 MG tablet Take 1 tablet by mouth daily. 90 tablet 1   No current facility-administered medications on file prior to visit.    BP 129/74   Pulse 65   Resp 20   Ht 6' (1.829 m)   Wt 195 lb (88.5 kg)   SpO2 99%   BMI 26.45 kg/m       Objective:   Physical Exam  General Mental Status- Alert. General Appearance- Not in acute distress.   Skin General: Color- Normal Color. Moisture- Normal Moisture.  Neck Carotid Arteries- Normal color. Moisture- Normal Moisture. No carotid bruits. No JVD.  Chest and Lung Exam Auscultation: Breath Sounds:-Normal.  Cardiovascular Auscultation:Rythm- Regular. Murmurs & Other Heart Sounds:Auscultation of the heart reveals- No Murmurs.   Neurologic Cranial Nerve exam:- CN III-XII intact(No nystagmus), symmetric smile. Strength:- 5/5 equal and symmetric strength both upper and lower extremities.       Assessment & Plan:  Bp is very well controlled today. Continue micardis.   For mood and anxiety well controlled continue prozac and follow up with psychiatrist.  For high cholesterol recheck lipid panel and cmp.  Glad to hear insomnia resolved.  Hx of low testosterone. Pt is currently on clomid.  Follow up in 6 months or as needed  [employer],  [employer]

## 2020-11-17 NOTE — Patient Instructions (Addendum)
Bp is very well controlled today. Continue micardis.   For mood and anxiety well controlled continue prozac and follow up with psychiatrist.  For high cholesterol recheck lipid panel and cmp.  Glad to hear insomnia resolved.  Follow up date to  be determined after lab review.

## 2020-11-17 NOTE — Addendum Note (Signed)
Addended by: Gwenevere Abbot on: 11/17/2020 10:54 AM   Modules accepted: Level of Service

## 2020-11-20 ENCOUNTER — Ambulatory Visit: Payer: Self-pay | Admitting: Podiatry

## 2020-11-21 ENCOUNTER — Ambulatory Visit: Payer: 59

## 2020-11-21 ENCOUNTER — Other Ambulatory Visit: Payer: Self-pay

## 2020-11-21 ENCOUNTER — Ambulatory Visit (INDEPENDENT_AMBULATORY_CARE_PROVIDER_SITE_OTHER): Payer: 59 | Admitting: Physician Assistant

## 2020-11-21 VITALS — BP 138/86 | HR 60

## 2020-11-21 VITALS — BP 146/82 | HR 62

## 2020-11-21 DIAGNOSIS — F429 Obsessive-compulsive disorder, unspecified: Secondary | ICD-10-CM | POA: Diagnosis not present

## 2020-11-21 DIAGNOSIS — F902 Attention-deficit hyperactivity disorder, combined type: Secondary | ICD-10-CM | POA: Diagnosis not present

## 2020-11-21 DIAGNOSIS — F411 Generalized anxiety disorder: Secondary | ICD-10-CM

## 2020-11-21 DIAGNOSIS — F338 Other recurrent depressive disorders: Secondary | ICD-10-CM | POA: Diagnosis not present

## 2020-11-21 DIAGNOSIS — F339 Major depressive disorder, recurrent, unspecified: Secondary | ICD-10-CM | POA: Diagnosis not present

## 2020-11-21 MED ORDER — SPRAVATO (84 MG DOSE) 28 MG/DEVICE NA SOPK: 84.0000 mg | PACK | NASAL | 0 refills | Status: DC

## 2020-11-21 NOTE — Progress Notes (Deleted)
[facility]  Patient ID: Albert [name],  MRN: [id number]  PCP: Esperanza Richters, PA-C  Date of Evaluation: 11/21/2020 Time spent:{TIME; 0 MIN TO 60 MIN:[phone]}  Chief Complaint:   HISTORY/CURRENT STATUS: HPI  Individual Medical History/ Review of Systems: Changes? :{EXAM; YES/NO:21197}  Allergies: Sulfamethoxazole and Sulfonamide derivatives  Current Medications:  Current Outpatient Medications:    clomiPHENE (CLOMID) 50 MG tablet, TAKE 1/2 TABLET BY MOUTH EVERY DAY, Disp: 45 tablet, Rfl: 3   colchicine 0.6 MG tablet, Take one bid prn, Disp: 20 tablet, Rfl: 0   Esketamine HCl, 84 MG Dose, (SPRAVATO, 84 MG DOSE,) 28 MG/DEVICE SOPK, Place 84 mg into the nose 2 (two) times a week., Disp: 3 each, Rfl: 0   eszopiclone (LUNESTA) 2 MG TABS tablet, Take 1 tablet (2 mg total) by mouth at bedtime as needed for sleep. Take immediately before bedtime, Disp: 30 tablet, Rfl: 5   ezetimibe (ZETIA) 10 MG tablet, Take 1 tablet (10 mg total) by mouth daily., Disp: 90 tablet, Rfl: 3   FLUoxetine (PROZAC) 40 MG capsule, Take 1 capsule (40 mg total) by mouth daily., Disp: 30 capsule, Rfl: 1   LORazepam (ATIVAN) 0.5 MG tablet, Take 0.5-1 tablets (0.25-0.5 mg total) by mouth 2 (two) times daily as needed for anxiety., Disp: 30 tablet, Rfl: 1   telmisartan-hydrochlorothiazide (MICARDIS HCT) 80-12.5 MG tablet, Take 1 tablet by mouth daily., Disp: 90 tablet, Rfl: 1 Medication Side Effects: {Medication Side Effects (Optional):12147}  Family Medical/ Social History: Changes? {EXAM; YES/NO:19492}  MENTAL HEALTH EXAM:  There were no vitals taken for this visit.There is no height or weight on file to calculate BMI.  General Appearance: {PSY:[phone]}  Eye Contact:  {PSY:22684}  Speech:  {PSY:[phone]}  Volume:  {PSY:22686}  Mood:  {PSY:22306}  Affect:  {PSY:[phone]}  Thought Process:  {PSY:22688}  Orientation:  {PSY:22689}  Thought Content: {PSYt:22690}   Suicidal Thoughts:   {PSY:22692}  Homicidal Thoughts:  {PSY:22692}  Memory:  {[phone]}  Judgement:  {PSY:22694}  Insight:  {PSY:22695}  Psychomotor Activity:  {PSY:22696}  Concentration:  {PSY:21399}  Recall:  {PSY:22877}  Fund of Knowledge: {PSY:22877}  Language: {EPP:29518}  Assets:  {PSY:22698}  ADL's:  {PSY:22290}  Cognition: {PSY:[id number]}  Prognosis:  {PSY:22877}    DIAGNOSES: No diagnosis found.  Receiving Psychotherapy: {ACZ:66063}   RECOMMENDATIONS:    [name], PA-C

## 2020-11-21 NOTE — Progress Notes (Signed)
[facility]  Patient ID: Albert [name],  MRN: [id number]  PCP: Esperanza Richters, PA-C  Date of Evaluation: 11/21/2020 Time spent:40 minutes  Chief Complaint:  Chief Complaint   Depression; Other     HISTORY/CURRENT STATUS: HPI For Spravato #10  Doing really well. Last Spravato was 3 days ago, after not having a treatment for 2 weeks b/c he was out of town. Since the last one, he's been much better with his mood. Not feeling sad for no reason, feels more energetic and motivated to do things. Not isolating. Work is going well. No trouble sleeping except the night of the spravato treatment.  He does need to use the Lunesta at times however.  Denies SI/HI.   OCD sx are well-controlled. Spravato is helping with the anxiety as well as the depression. Rarely obsesses about things and has no compulsions either. No PA.   Patient denies increased energy with decreased need for sleep, no increased talkativeness, no racing thoughts, no impulsivity or risky behaviors, no increased spending, no increased libido, no grandiosity, no increased irritability or anger, and no hallucinations.  During the 2 hour time period he is in the office after Spravato administration, he has dissociation, but usually he is very lucid after a 2-hour time period and the time that his wife picks him up.  He specifically denies headache or dizziness associated with the treatment.  Review of Systems  Constitutional: Negative.   HENT: Negative.    Eyes: Negative.   Respiratory: Negative.    Cardiovascular: Negative.   Genitourinary: Negative.   Musculoskeletal: Negative.   Skin: Negative.   Neurological: Negative.   Endo/Heme/Allergies: Negative.   Psychiatric/Behavioral:         See HPI    Individual Medical History/ Review of Systems: Changes? :No   Past medications for mental health diagnoses include: Luvox, Lunesta, Xanax, Wellbutrin, Risperdal, Prozac, Ambien, Deplin, Abilify, naltrexone,  Adderall, Spravato  Allergies: Sulfamethoxazole and Sulfonamide derivatives  Current Medications:  Current Outpatient Medications:    clomiPHENE (CLOMID) 50 MG tablet, TAKE 1/2 TABLET BY MOUTH EVERY DAY, Disp: 45 tablet, Rfl: 3   colchicine 0.6 MG tablet, Take one bid prn, Disp: 20 tablet, Rfl: 0   Esketamine HCl, 84 MG Dose, (SPRAVATO, 84 MG DOSE,) 28 MG/DEVICE SOPK, Place 84 mg into the nose 2 (two) times a week., Disp: 3 each, Rfl: 0   eszopiclone (LUNESTA) 2 MG TABS tablet, Take 1 tablet (2 mg total) by mouth at bedtime as needed for sleep. Take immediately before bedtime, Disp: 30 tablet, Rfl: 5   ezetimibe (ZETIA) 10 MG tablet, Take 1 tablet (10 mg total) by mouth daily., Disp: 90 tablet, Rfl: 3   FLUoxetine (PROZAC) 40 MG capsule, Take 1 capsule (40 mg total) by mouth daily., Disp: 30 capsule, Rfl: 1   LORazepam (ATIVAN) 0.5 MG tablet, Take 0.5-1 tablets (0.25-0.5 mg total) by mouth 2 (two) times daily as needed for anxiety., Disp: 30 tablet, Rfl: 1   telmisartan-hydrochlorothiazide (MICARDIS HCT) 80-12.5 MG tablet, Take 1 tablet by mouth daily., Disp: 90 tablet, Rfl: 1 Medication Side Effects: none  Family Medical/ Social History: Changes? No  MENTAL HEALTH EXAM:  Before Spravato, BP 146/82, pulse 62 40 minutes after administration of Spravato BP was 148/84, pulse 65 2 hours after administration of Spravato blood pressure 138/86, pulse 60   Blood pressure (!) 146/82, pulse 62.There is no height or weight on file to calculate BMI.  General Appearance:  When I entered the treatment room  Jameel is like that in his recliner with earphones and an eye mask resting.  He is easily aroused .  Eye Contact:  Good  Speech:  Clear and Coherent and Normal Rate  Volume:  Normal  Mood:  Euthymic  Affect:  Appropriate  Thought Process:  Goal Directed and Descriptions of Associations: Circumstantial  Orientation:  Full (Time, Place, and Person)  Thought Content: Logical   Suicidal Thoughts:   No  Homicidal Thoughts:  No  Memory:  WNL  Judgement:  Good  Insight:  Good  Psychomotor Activity:  Normal  Concentration:  Concentration: Good  Recall:  Good  Fund of Knowledge: Good  Language: Good  Assets:  Desire for Improvement Financial Resources/Insurance Housing Physical Health Resilience Social Support Transportation Vocational/Educational  ADL's:  Intact  Cognition: WNL  Prognosis:  Good   He did report dissociation in the first 1 to 1-1/2 hours after administration of Spravato.  Nothing frightening or severe or lasting long.  By the time he left the office when his wife picked him up, he was back to his normal thought processes.  DIAGNOSES:    ICD-10-CM   1. 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 Seasonal affective disorder (HCC)  F33.8     3. Obsessive-compulsive disorder, unspecified type  F42.9     4. Attention deficit hyperactivity disorder (ADHD), combined type  F90.2     5. Generalized anxiety disorder  F41.1       Receiving Psychotherapy: No    RECOMMENDATIONS:  PDMP was reviewed. I provided 40 minutes of face to face time during this encounter, including time spent before and after the visit in records review, medical decision making, and charting.  He is responding very well to this Spravato so we will continue it.  At this point we agree to decrease the frequency of the Spravato to once a week. Again discussed his blood pressure elevations but his PCP does not feel like it is a concern, that we are having elevated readings while here for this treatment. Continue Spravato 84 mg weekly. Continue Prozac 40 mg, 1 p.o. daily. Continue Lunesta 2 mg, 1 p.o. nightly as needed. Continue Ativan 0.5 mg, 1 p.o. twice daily as needed. Return in 1 week.  [name], PA-C

## 2020-11-21 NOTE — Progress Notes (Deleted)
              [facility]  Patient ID: Albert [name],  MRN: [id number]  PCP: Esperanza Richters, PA-C  Date of Evaluation: 11/21/2020 Time spent:{TIME; 0 MIN TO 60 MIN:[id number]}  Chief Complaint:   HISTORY/CURRENT STATUS: HPI  Individual Medical History/ Review of Systems: Changes? :{EXAM; YES/NO:21197}  Past medications for mental health diagnoses include: Luvox, Lunesta, Xanax, Wellbutrin, Risperdal, Prozac, Ambien, Deplin, Abilify, naltrexone, Adderall, Spravato     Allergies: Sulfamethoxazole and Sulfonamide derivatives  Current Medications:  Current Outpatient Medications:    clomiPHENE (CLOMID) 50 MG tablet, TAKE 1/2 TABLET BY MOUTH EVERY DAY, Disp: 45 tablet, Rfl: 3   colchicine 0.6 MG tablet, Take one bid prn, Disp: 20 tablet, Rfl: 0   Esketamine HCl, 84 MG Dose, (SPRAVATO, 84 MG DOSE,) 28 MG/DEVICE SOPK, Place 84 mg into the nose 2 (two) times a week., Disp: 3 each, Rfl: 0   eszopiclone (LUNESTA) 2 MG TABS tablet, Take 1 tablet (2 mg total) by mouth at bedtime as needed for sleep. Take immediately before bedtime, Disp: 30 tablet, Rfl: 5   ezetimibe (ZETIA) 10 MG tablet, Take 1 tablet (10 mg total) by mouth daily., Disp: 90 tablet, Rfl: 3   FLUoxetine (PROZAC) 40 MG capsule, Take 1 capsule (40 mg total) by mouth daily., Disp: 30 capsule, Rfl: 1   LORazepam (ATIVAN) 0.5 MG tablet, Take 0.5-1 tablets (0.25-0.5 mg total) by mouth 2 (two) times daily as needed for anxiety., Disp: 30 tablet, Rfl: 1   telmisartan-hydrochlorothiazide (MICARDIS HCT) 80-12.5 MG tablet, Take 1 tablet by mouth daily., Disp: 90 tablet, Rfl: 1 Medication Side Effects: {Medication Side Effects (Optional):12147}  Family Medical/ Social History: Changes? {EXAM; YES/NO:19492}  MENTAL HEALTH EXAM:     There were no vitals taken for this visit.There is no height or weight on file to calculate BMI.  General Appearance: {PSY:[id number]}  Eye Contact:  {PSY:22684}  Speech:  {PSY:[id number]}   Volume:  {PSY:22686}  Mood:  {PSY:22306}  Affect:  {PSY:[phone]}  Thought Process:  {PSY:22688}  Orientation:  {PSY:22689}  Thought Content: {PSYt:22690}   Suicidal Thoughts:  {PSY:22692}  Homicidal Thoughts:  {PSY:22692}  Memory:  {PSY:[id number]}  Judgement:  {PSY:22694}  Insight:  {PSY:22695}  Psychomotor Activity:  {PSY:22696}  Concentration:  {PSY:21399}  Recall:  {PSY:22877}  Fund of Knowledge: {PSY:22877}  Language: {JSE:83151}  Assets:  {PSY:22698}  ADL's:  {PSY:22290}  Cognition: {PSY:304700322}  Prognosis:  {PSY:22877}    DIAGNOSES: No diagnosis found.  Receiving Psychotherapy: {VOH:60737}   RECOMMENDATIONS:    [name], PA-C

## 2020-11-23 ENCOUNTER — Other Ambulatory Visit: Payer: Self-pay

## 2020-11-23 MED ORDER — SPRAVATO (84 MG DOSE) 28 MG/DEVICE NA SOPK: 84.0000 mg | PACK | NASAL | 0 refills | Status: DC

## 2020-11-23 NOTE — Progress Notes (Signed)
NURSE VISIT: Pt here for his Spravato treatment for treatment resistant depression. He will receive 84 mg (3 of the 28 mg) nasal sprays today. Pt taken to treatment room. Spravato is stored at the doctor's office per FDA regulations in the safe behind another locked door due to being a schedule CIII medicaton. Pt's medication is delivered by [facility] in [place]. After nasal sprays used they are disposed of per FDA regulations. Pt reports he does feel his depression coming back on. Pt was out of town for 2 weeks and missed some doses. Will update Rosey Bath and may have pt come again end of week to get an extra dose in due to missing last few doses.Beginning vital signs taken at 2:15 pm 146/87, 80 pulse. Pt instructed to blow his nose and recline back 45 degrees. He began his first nasal spray, with 5 minutes between next 2 nasal sprays. Pt brings head phones and an eye mask and relaxes in recliner after receiving his treatment. Pt sleeping after receiving treatment. He also prefers the lights out overhead. Pt awoken for his 40 minute vital sign check at 1455, B/P 141/87, pulse 62. Pt then resting/sleeping till [name], PA-C comes in near end of treatment to discuss his care and treatments.Discharge vital signs at 1600 were 160/95, pulse 59. Vital signs remain a bit elevated and Teresa aware.  Pt is feeling good at discharge with no complaints. Rosey Bath has decided pt should come back on Friday for another treatment. Pt contacts his wife to pick him up. scheduled apt is this Friday, June 10  th.  Pt is observed by nurse for 120 minutes per FDA/REMS requirements. Pt was clinically assessed by Nurse for 45 minutes.   EXP 2023-SEP LOT [id number]

## 2020-11-24 NOTE — Progress Notes (Signed)
NURSE VISIT: Pt here for his Spravato treatment for treatment resistant depression. He will receive 84 mg (3 of the 28 mg) nasal sprays today. Pt taken to treatment room. Spravato is stored at the doctor's office per FDA regulations in the safe behind another locked door due to being a schedule CIII medicaton. Pt's medication is delivered by [facility] in [place]. After nasal sprays used they are disposed of per FDA regulations.  get an extra dose in due to missing last few doses.Beginning vital signs taken at 2:15 pm 146/82, 62 pulse. Pt instructed to blow his nose and recline back 45 degrees. He began his first nasal spray, with 5 minutes between next 2 nasal sprays. Pt brings head phones and an eye mask and relaxes in recliner after receiving his treatment. Pt sleeping after receiving treatment. He also prefers the lights out overhead. Pt awoken for his 40 minute vital sign check at 1455, B/P 148/84, pulse 65. Pt then resting/sleeping till [name], PA-C comes in near end of treatment to discuss his care and treatments.Discharge vital signs at 1600 were 138/86, pulse 58. Pt is feeling good at discharge with no complaints. Pt observed a total of 120 minutes with all dissociation and sleepiness resolved. Nursing assessed for a total of 45 minutes. Next scheduled apt is next Tuesday, June 21st.   [id number] EXP [id number]

## 2020-11-24 NOTE — Progress Notes (Signed)
NURSE VISIT: Pt here for his Spravato treatment for treatment resistant depression. He will receive 84 mg (3 of the 28 mg) nasal sprays today. Pt taken to treatment room. Spravato is stored at the doctor's office per FDA regulations in the safe behind another locked door due to being a schedule CIII medicaton. Pt's medication is delivered by [facility] in [place]. After nasal sprays used they are disposed of per FDA regulations.  get an extra dose in due to missing last few doses.Beginning vital signs taken at 2:15 pm 145/87, 67 pulse. Pt instructed to blow his nose and recline back 45 degrees. He began his first nasal spray, with 5 minutes between next 2 nasal sprays. Pt brings head phones and an eye mask and relaxes in recliner after receiving his treatment. Pt sleeping after receiving treatment. He also prefers the lights out overhead. Pt awoken for his 40 minute vital sign check at 1455, B/P 144/85, pulse 62. Pt then resting/sleeping till [name], PA-C comes in near end of treatment to discuss his care and treatments.Discharge vital signs at 1600 were 143/92, pulse 58. Pt is feeling good at discharge with no complaints. Next scheduled apt is next Tuesday, June 14 th.  [id number] EXP OCT 2023

## 2020-11-26 ENCOUNTER — Encounter: Payer: Self-pay | Admitting: Physician Assistant

## 2020-11-27 ENCOUNTER — Other Ambulatory Visit: Payer: Self-pay

## 2020-11-27 MED ORDER — SPRAVATO (84 MG DOSE) 28 MG/DEVICE NA SOPK: 84.0000 mg | PACK | NASAL | 0 refills | Status: DC

## 2020-11-28 ENCOUNTER — Ambulatory Visit: Payer: 59

## 2020-11-28 ENCOUNTER — Ambulatory Visit (INDEPENDENT_AMBULATORY_CARE_PROVIDER_SITE_OTHER): Payer: 59 | Admitting: Physician Assistant

## 2020-11-28 ENCOUNTER — Other Ambulatory Visit: Payer: Self-pay

## 2020-11-28 VITALS — BP 158/96 | HR 58

## 2020-11-28 DIAGNOSIS — F411 Generalized anxiety disorder: Secondary | ICD-10-CM

## 2020-11-28 DIAGNOSIS — F338 Other recurrent depressive disorders: Secondary | ICD-10-CM

## 2020-11-28 DIAGNOSIS — F339 Major depressive disorder, recurrent, unspecified: Secondary | ICD-10-CM

## 2020-11-28 DIAGNOSIS — F429 Obsessive-compulsive disorder, unspecified: Secondary | ICD-10-CM | POA: Diagnosis not present

## 2020-11-28 DIAGNOSIS — F902 Attention-deficit hyperactivity disorder, combined type: Secondary | ICD-10-CM

## 2020-11-28 MED ORDER — FLUOXETINE HCL 20 MG PO CAPS: 20.0000 mg | ORAL_CAPSULE | Freq: Every day | ORAL | 1 refills | Status: DC

## 2020-11-28 NOTE — Progress Notes (Signed)
[facility]  Patient ID: Albert [name],  MRN: [id number]  PCP: Esperanza Richters, PA-C  Date of Evaluation: 11/28/2020 Time spent:40 minutes  Chief Complaint:  Chief Complaint   Depression; Follow-up      HISTORY/CURRENT STATUS: HPI For Spravato # 11  Last Spravato was 7 days ago.  States in the past few days, he started feeling a little bit more depressed.  Says he is able to tell that weekly treatments on test at least for now.  He does not want to go longer in between treatments though.  He is able to enjoy things.  Energy and motivation are good.  Work is going well and he sleeps good.  Sometimes on the night of the Spravato treatment he does not sleep as well but that is all.  Does not isolate.  No crying uncontrollably.  No suicidal or homicidal thoughts.  OCD is well controlled.  He is not having ruminating thoughts that he cannot get out of his head.  No compulsions to speak of.  He does report decreased libido with the Prozac and wonders if that could be decreased or stopped.  He is not having hardly any anxiety right now.  Patient denies increased energy with decreased need for sleep, no increased talkativeness, no racing thoughts, no impulsivity or risky behaviors, no increased spending, no increased libido, no grandiosity, no increased irritability or anger, and no hallucinations.  At the end of the 2-hour observation.  He did have dissociation but nothing scary.  Denies headache or dizziness.   Review of Systems  Constitutional: Negative.   HENT: Negative.    Eyes: Negative.   Respiratory: Negative.    Cardiovascular: Negative.   Gastrointestinal: Negative.   Genitourinary: Negative.   Musculoskeletal: Negative.   Skin: Negative.   Neurological: Negative.   Endo/Heme/Allergies: Negative.   Psychiatric/Behavioral:         See HPI    Individual Medical History/ Review of Systems: Changes? :No   Past medications for mental health diagnoses  include: Luvox, Lunesta, Xanax, Wellbutrin, Risperdal, Prozac, Ambien, Deplin, Abilify, naltrexone, Adderall, Spravato  Allergies: Sulfamethoxazole and Sulfonamide derivatives  Current Medications:  Current Outpatient Medications:    FLUoxetine (PROZAC) 20 MG capsule, Take 1 capsule (20 mg total) by mouth daily., Disp: 30 capsule, Rfl: 1   clomiPHENE (CLOMID) 50 MG tablet, TAKE 1/2 TABLET BY MOUTH EVERY DAY, Disp: 45 tablet, Rfl: 3   colchicine 0.6 MG tablet, Take one bid prn, Disp: 20 tablet, Rfl: 0   Esketamine HCl, 84 MG Dose, (SPRAVATO, 84 MG DOSE,) 28 MG/DEVICE SOPK, Place 84 mg into the nose 2 (two) times a week., Disp: 3 each, Rfl: 0   eszopiclone (LUNESTA) 2 MG TABS tablet, Take 1 tablet (2 mg total) by mouth at bedtime as needed for sleep. Take immediately before bedtime, Disp: 30 tablet, Rfl: 5   ezetimibe (ZETIA) 10 MG tablet, Take 1 tablet (10 mg total) by mouth daily., Disp: 90 tablet, Rfl: 3   LORazepam (ATIVAN) 0.5 MG tablet, Take 0.5-1 tablets (0.25-0.5 mg total) by mouth 2 (two) times daily as needed for anxiety., Disp: 30 tablet, Rfl: 1   telmisartan-hydrochlorothiazide (MICARDIS HCT) 80-12.5 MG tablet, Take 1 tablet by mouth daily., Disp: 90 tablet, Rfl: 1 Medication Side Effects: sexual dysfunction  Family Medical/ Social History: Changes? No  MENTAL HEALTH EXAM:  Before Spravato-BP 138/85, P 60 40 minutes after administration of Spravato BP-159/95, P 62 2 hours after administration of Spravato-BP 158/96,P 58   There  were no vitals taken for this visit.There is no height or weight on file to calculate BMI.  General Appearance: Casual and Well Groomed.  Eye Contact:  Good  Speech:  Clear and Coherent and Normal Rate  Volume:  Normal  Mood:  Euthymic  Affect:  Appropriate  Thought Process:  Goal Directed and Descriptions of Associations: Circumstantial  Orientation:  Full (Time, Place, and Person)  Thought Content: Logical   Suicidal Thoughts:  No  Homicidal  Thoughts:  No  Memory:  WNL  Judgement:  Good  Insight:  Good  Psychomotor Activity:  Normal  Concentration:  Concentration: Good  Recall:  Good  Fund of Knowledge: Good  Language: Good  Assets:  Desire for Improvement Financial Resources/Insurance Housing Physical Health Resilience Social Support Transportation Vocational/Educational  ADL's:  Intact  Cognition: WNL  Prognosis:  Good     DIAGNOSES:    ICD-10-CM   1. 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 Seasonal affective disorder (HCC)  F33.8     3. Obsessive-compulsive disorder, unspecified type  F42.9     4. Generalized anxiety disorder  F41.1     5. Attention deficit hyperactivity disorder (ADHD), combined type  F90.2        Receiving Psychotherapy: No    RECOMMENDATIONS:  PDMP was reviewed. I provided 40 minutes of face to face time during this encounter, including time spent before and after the visit in records review, complex medical decision making, and charting.  We discussed the side effect of decreased libido secondary to the SSRI.  I am not sure if it will help her not get we will try decreasing the dose of the Prozac.  He has to be on an antidepressant other than the Spravato due to synergistic effect.  Plus when he has been off of an SSRI in the past the OCD usually gets worse.  He would like to try decreasing the dose. He continues to respond well to the Spravato so no changes will be made.   Continue Spravato 84 mg weekly. Decrease Prozac to 20 mg, 1 p.o. daily. Continue Lunesta 2 mg, 1 p.o. nightly as needed. Continue Ativan 0.5 mg, 1 p.o. twice daily as needed. Return in 1 week.  [name], PA-C

## 2020-11-29 NOTE — Progress Notes (Signed)
NURSE VISIT: Pt here for his Spravato treatment for treatment resistant depression. He will receive 84 mg (3 of the 28 mg) nasal sprays today. Pt taken to treatment room. Spravato is stored at the doctor's office per FDA regulations in the safe behind another locked door due to being a schedule CIII medicaton. Pt's medication is delivered by [facility] in [place]. After nasal sprays used they are disposed of per FDA regulations.  Beginning vital signs taken at 2:00 pm 138/85, 60 pulse. Pt instructed to blow his nose and recline back 45 degrees. He began his first nasal spray, with 5 minutes between next 2 nasal sprays. Pt brings head phones and an eye mask and relaxes in recliner after receiving his treatment. Pt sleeping after receiving his 3rd dose of Spravato within 30 minutes of treatment. Pt prefers the lights out overhead while resting in treatment room. Pt 40 minute vital sign check at 1445, B/P 159/95, pulse 62. Pt then resting/sleeping till [name], PA-C comes in near end of treatment to discuss his care and treatments.Discharge vital signs at 1600 were 158/96, pulse 58. Pt is feeling good at discharge with no complaints. Pt observed a total of 120 minutes with all symptoms of dissociation and sleepiness resolved. No medications given during treatment. Nursing assessed for a total of 45 minutes. Next scheduled apt is next Tuesday, June 28th.   [id number] EXP OCT 2023

## 2020-12-03 ENCOUNTER — Encounter: Payer: Self-pay | Admitting: Physician Assistant

## 2020-12-04 ENCOUNTER — Other Ambulatory Visit: Payer: Self-pay

## 2020-12-04 MED ORDER — SPRAVATO (84 MG DOSE) 28 MG/DEVICE NA SOPK: 84.0000 mg | PACK | NASAL | 0 refills | Status: DC

## 2020-12-05 ENCOUNTER — Ambulatory Visit (INDEPENDENT_AMBULATORY_CARE_PROVIDER_SITE_OTHER): Payer: 59 | Admitting: Physician Assistant

## 2020-12-05 ENCOUNTER — Ambulatory Visit: Payer: 59

## 2020-12-05 ENCOUNTER — Encounter: Payer: Self-pay | Admitting: Physician Assistant

## 2020-12-05 ENCOUNTER — Other Ambulatory Visit: Payer: Self-pay

## 2020-12-05 VITALS — BP 160/87 | HR 70

## 2020-12-05 VITALS — BP 135/85 | HR 62

## 2020-12-05 DIAGNOSIS — F338 Other recurrent depressive disorders: Secondary | ICD-10-CM | POA: Diagnosis not present

## 2020-12-05 DIAGNOSIS — F411 Generalized anxiety disorder: Secondary | ICD-10-CM

## 2020-12-05 DIAGNOSIS — F902 Attention-deficit hyperactivity disorder, combined type: Secondary | ICD-10-CM

## 2020-12-05 DIAGNOSIS — F339 Major depressive disorder, recurrent, unspecified: Secondary | ICD-10-CM | POA: Diagnosis not present

## 2020-12-05 DIAGNOSIS — F429 Obsessive-compulsive disorder, unspecified: Secondary | ICD-10-CM

## 2020-12-05 DIAGNOSIS — R030 Elevated blood-pressure reading, without diagnosis of hypertension: Secondary | ICD-10-CM

## 2020-12-05 NOTE — Progress Notes (Signed)
[facility]  Patient ID: Albert [name],  MRN: [id number]  PCP: Esperanza Richters, PA-C  Date of Evaluation: 12/05/2020 Time spent:40 minutes  Chief Complaint:  Chief Complaint   Depression     HISTORY/CURRENT STATUS: HPI For Spravato # 12  Last Spravato was 7 days ago.  He is tolerating the treatments well and still feels like it is helping his mood.  Feels like the weekly treatments are [name], at least at this point.  He has had 2 full weeks now of only once a week treatment.  He is able to enjoy things.  Energy and motivation are still good. He sleeps well.  Initially after starting the Spravato he had trouble sleeping the night of the treatment but that is not happening anymore.  Does not isolate.  Appetite is normal and weight is stable.  Denies suicidal or homicidal thoughts.  At the last visit we decreased the Prozac due to sexual side effects.  He went from 40 mg to 20 mg.  He states he can feel a negative difference already.  Obsessing more and having more generalized anxiety.  He went back up to 40 mg this morning.  Before decreasing the Prozac the OCD and anxiety in general were well controlled.  No compulsions.  Work is going well, actually he was on a zoom meeting right at the time of his appointment and he was not able to start the Spravato treatment on time due to this meeting.  He was in our break room on this Zoom call.   At the end of the 2-hour observation, he reports that he is doing well.  Experienced dissociation, very relaxed for about an hour and now feels kind of groggy but otherwise okay. Denies headache or dizziness.   Review of Systems  Constitutional: Negative.   HENT: Negative.    Eyes: Negative.   Respiratory: Negative.    Cardiovascular: Negative.   Gastrointestinal: Negative.   Genitourinary: Negative.   Musculoskeletal: Negative.   Skin: Negative.   Neurological: Negative.   Endo/Heme/Allergies: Negative.   Psychiatric/Behavioral:          See HPI    Individual Medical History/ Review of Systems: Changes? :No   Past medications for mental health diagnoses include: Luvox, Lunesta, Xanax, Wellbutrin, Risperdal, Prozac, Ambien, Deplin, Abilify, naltrexone, Adderall, Spravato  Allergies: Sulfamethoxazole and Sulfonamide derivatives  Current Medications:  Current Outpatient Medications:    clomiPHENE (CLOMID) 50 MG tablet, TAKE 1/2 TABLET BY MOUTH EVERY DAY, Disp: 45 tablet, Rfl: 3   colchicine 0.6 MG tablet, Take one bid prn, Disp: 20 tablet, Rfl: 0   Esketamine HCl, 84 MG Dose, (SPRAVATO, 84 MG DOSE,) 28 MG/DEVICE SOPK, Place 84 mg into the nose 2 (two) times a week. (Patient taking differently: Place 84 mg into the nose 2 (two) times a week. Once a week at present. That can change so not updating med list.), Disp: 3 each, Rfl: 0   eszopiclone (LUNESTA) 2 MG TABS tablet, Take 1 tablet (2 mg total) by mouth at bedtime as needed for sleep. Take immediately before bedtime, Disp: 30 tablet, Rfl: 5   ezetimibe (ZETIA) 10 MG tablet, Take 1 tablet (10 mg total) by mouth daily., Disp: 90 tablet, Rfl: 3   FLUoxetine (PROZAC) 20 MG capsule, Take 1 capsule (20 mg total) by mouth daily. (Patient taking differently: Take 40 mg by mouth daily.), Disp: 30 capsule, Rfl: 1   LORazepam (ATIVAN) 0.5 MG tablet, Take 0.5-1 tablets (0.25-0.5 mg total) by  mouth 2 (two) times daily as needed for anxiety., Disp: 30 tablet, Rfl: 1   telmisartan-hydrochlorothiazide (MICARDIS HCT) 80-12.5 MG tablet, Take 1 tablet by mouth daily., Disp: 90 tablet, Rfl: 1 Medication Side Effects: sexual dysfunction  Family Medical/ Social History: Changes? No  MENTAL HEALTH EXAM:  Before Spravato-BP 160/87, pulse 70 40 minutes after administration of Spravato-BP 159/95, pulse 63 2 hours after administration of Spravato-BP 135/85, pulse 62   Blood pressure (!) 160/87, pulse 70.There is no height or weight on file to calculate BMI.  General Appearance: Casual and Well  Groomed.  Eye Contact:  Good  Speech:  Clear and Coherent and Normal Rate  Volume:  Normal  Mood:  Euthymic  Affect:  Appropriate  Thought Process:  Goal Directed and Descriptions of Associations: Circumstantial  Orientation:  Full (Time, Place, and Person)  Thought Content: Logical   Suicidal Thoughts:  No  Homicidal Thoughts:  No  Memory:  WNL  Judgement:  Good  Insight:  Good  Psychomotor Activity:  Normal  Concentration:  Concentration: Good and Attention Span: Good  Recall:  Good  Fund of Knowledge: Good  Language: Good  Assets:  Desire for Improvement Financial Resources/Insurance Housing Physical Health Resilience Social Support Transportation Vocational/Educational  ADL's:  Intact  Cognition: WNL  Prognosis:  Good   See MADRS score  DIAGNOSES:    ICD-10-CM   1. 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 Seasonal affective disorder (HCC)  F33.8     3. Obsessive-compulsive disorder, unspecified type  F42.9     4. Attention deficit hyperactivity disorder (ADHD), combined type  F90.2     5. Generalized anxiety disorder  F41.1     6. Elevated blood pressure reading  R03.0         Receiving Psychotherapy: No    RECOMMENDATIONS:  PDMP was reviewed.  Last Spravato 12/04/2020 I provided 40 minutes of face to face time during this encounter, including time spent before and after the visit in records review, complex medical decision making, and charting.  In the future if he has a Zoom meeting or anything that will interfere with the treatment, I have asked him to reschedule the Spravato treatment. Blood pressures actually decreased throughout this treatment.  He states he was anxious at the first pressure reading because he had just gotten off the Zoom phone call for work. We discussed the fact of decreasing the Prozac at the last visit.  He can feel a difference so I recommend he increase back up to 40 mg daily.  He agrees. He  continues to respond well to the Spravato so no changes will be made.   Continue Spravato 84 mg weekly. Increase Prozac back to a total of 40 mg daily.   Continue Lunesta 2 mg, 1 p.o. nightly as needed. Continue Ativan 0.5 mg, 1 p.o. twice daily as needed. Return in 1 week.  [name], PA-C

## 2020-12-07 ENCOUNTER — Ambulatory Visit: Payer: 59 | Admitting: Physician Assistant

## 2020-12-08 ENCOUNTER — Ambulatory Visit: Payer: 59 | Admitting: Physician Assistant

## 2020-12-08 NOTE — Progress Notes (Signed)
NURSE VISIT: Pt here for his Spravato treatment for treatment resistant depression. He will receive 84 mg (3 of the 28 mg) nasal sprays today. Pt taken to treatment room, but today he suddenly had a zoom call he was talking to. After 15 minutes waiting on pt, Rosey Bath advised pt he needed to get started with his treatment. Pt apologized it came up at the last minute. Spravato is stored at the doctor's office per FDA regulations in the safe behind another locked door due to being a schedule CIII medicaton. Pt's medication is delivered by [facility] in [place]. After nasal sprays used they are disposed of per FDA regulations.  Beginning vital signs taken at 2:23 pm 160/87,70 pulse. Pt instructed to blow his nose and recline back 45 degrees. He began his first nasal spray, with 5 minutes between next 2 nasal sprays. Pt brings head phones and an eye mask and relaxes in recliner after receiving his treatment. Pt sleeping after receiving his 3rd dose of Spravato within 30 minutes of treatment. Pt prefers the lights out overhead while resting in treatment room. Pt 40 minute vital sign check at 1503, B/P 159/95, pulse 63. Pt then resting/sleeping till [name], PA-C comes in near end of treatment to discuss his care and treatments.Discharge vital signs at 1623 were 135/85, pulse 62. Pt is feeling good at discharge with no complaints. Pt observed a total of 120 minutes with all symptoms of dissociation and sleepiness resolved. No medications given during treatment. Nursing assessed for a total of 45 minutes.   [id number] EXP [id number]

## 2020-12-12 ENCOUNTER — Other Ambulatory Visit: Payer: Self-pay

## 2020-12-12 ENCOUNTER — Encounter: Payer: Self-pay | Admitting: Psychiatry

## 2020-12-12 ENCOUNTER — Ambulatory Visit: Payer: 59

## 2020-12-12 ENCOUNTER — Ambulatory Visit: Payer: 59 | Admitting: Physician Assistant

## 2020-12-12 ENCOUNTER — Ambulatory Visit (INDEPENDENT_AMBULATORY_CARE_PROVIDER_SITE_OTHER): Payer: 59 | Admitting: Psychiatry

## 2020-12-12 VITALS — BP 150/93 | HR 59

## 2020-12-12 DIAGNOSIS — F338 Other recurrent depressive disorders: Secondary | ICD-10-CM

## 2020-12-12 DIAGNOSIS — R030 Elevated blood-pressure reading, without diagnosis of hypertension: Secondary | ICD-10-CM

## 2020-12-12 DIAGNOSIS — G4700 Insomnia, unspecified: Secondary | ICD-10-CM

## 2020-12-12 DIAGNOSIS — F429 Obsessive-compulsive disorder, unspecified: Secondary | ICD-10-CM

## 2020-12-12 DIAGNOSIS — F339 Major depressive disorder, recurrent, unspecified: Secondary | ICD-10-CM

## 2020-12-12 DIAGNOSIS — F411 Generalized anxiety disorder: Secondary | ICD-10-CM

## 2020-12-12 MED ORDER — SPRAVATO (84 MG DOSE) 28 MG/DEVICE NA SOPK: 84.0000 mg | PACK | NASAL | 0 refills | Status: DC

## 2020-12-12 NOTE — Progress Notes (Signed)
Albert [name] [id number] January 22, 1976 45 y.o.  Subjective:   Patient ID:  Albert [name] is a 45 y.o. (DOB 1976/03/12) male.  Chief Complaint:  Chief Complaint  Patient presents with   Follow-up   Depression    HPI [name] presents to the office today for follow-up of major depression and OCD and administration of Spravato for treatment resistant depression..  IV ketamine Sept 2021 #6 and it helped depression.  This made him want to pursue Spravato for treatment of his depression.  The depression relapsed after he stopped receiving the IV ketamine.  12/12/20 appt noted" Patient receive Spravato 84 mg today.  He states it seems more intense after transitioning from twice weekly to once weekly.  However in general he tolerated it well without complications.  The dissociation resolved over the course of 2-hour observation..  No unusual adverse effects such as nausea or vomiting headache, hallucinations or psychosis.  He finds the Spravato markedly helpful and would like to continue it.  Overall his depression he rates as a 5/10 compared to a 2/10 when he was receiving Spravato twice weekly.  He has been at once weekly for about 3 weeks.  He reduced to once weekly for practical reasons.  He does not feel as good with regard to depression as he did when received receiving Spravato twice weekly.  He tried reducing Prozac due to sexual side effects but felt more depressed and increased back to 40 mg daily.  He wonders if there are alternatives.  He is not markedly anxious.  He is not suicidal.  Past medications for mental health diagnoses include: Luvox 200, Prozac 40,  Wellbutrin,  Risperdal, Abilify, Lunesta, Xanax, Ambien,  Deplin,  naltrexone,  Adderall  Review of Systems:  Review of Systems  Constitutional:  Positive for fatigue.  Cardiovascular:  Negative for chest pain and palpitations.  Neurological:  Negative for tremors and weakness.  Psychiatric/Behavioral:  Positive for  dysphoric mood.    Medications: I have reviewed the patient's current medications.  Current Outpatient Medications  Medication Sig Dispense Refill   clomiPHENE (CLOMID) 50 MG tablet TAKE 1/2 TABLET BY MOUTH EVERY DAY 45 tablet 3   colchicine 0.6 MG tablet Take one bid prn 20 tablet 0   [START ON 12/14/2020] Esketamine HCl, 84 MG Dose, (SPRAVATO, 84 MG DOSE,) 28 MG/DEVICE SOPK Place 84 mg into the nose 2 (two) times a week. 3 each 0   eszopiclone (LUNESTA) 2 MG TABS tablet Take 1 tablet (2 mg total) by mouth at bedtime as needed for sleep. Take immediately before bedtime 30 tablet 5   ezetimibe (ZETIA) 10 MG tablet Take 1 tablet (10 mg total) by mouth daily. 90 tablet 3   FLUoxetine (PROZAC) 20 MG capsule Take 1 capsule (20 mg total) by mouth daily. (Patient taking differently: Take 40 mg by mouth daily.) 30 capsule 1   LORazepam (ATIVAN) 0.5 MG tablet Take 0.5-1 tablets (0.25-0.5 mg total) by mouth 2 (two) times daily as needed for anxiety. 30 tablet 1   telmisartan-hydrochlorothiazide (MICARDIS HCT) 80-12.5 MG tablet Take 1 tablet by mouth daily. 90 tablet 1   No current facility-administered medications for this visit.    Medication Side Effects: None  Allergies:  Allergies  Allergen Reactions   Sulfamethoxazole     Other reaction(s): Arthralgias (intolerance)   Sulfonamide Derivatives     REACTION: Rash Stiff joints    Past Medical History:  Diagnosis Date   Attention deficit hyperactivity disorder (ADHD) 06/19/2016  Closed nondisplaced fracture of neck of fifth metacarpal bone of right hand 07/10/2016   CONTACT DERMATITIS&OTHER ECZEMA DUE UNSPEC CAUSE 10/06/2009   Qualifier: Diagnosis of  By: Yetta Barre MD, Bernadene Bell.    Depression    DEPRESSION 02/20/2009   Qualifier: Diagnosis of  By: Yetta Barre MD, Bernadene Bell.    Hyperlipidemia    NECK PAIN, ACUTE 09/13/2009   Qualifier: Diagnosis of  By: Yetta Barre MD, Bernadene Bell.    Thrombosed external hemorrhoid 10/06/2015   Symptoms and exam consistent with  thrombosed external hemorrhoid. Patient declines incise and drain at this time. Treat conservatively with hydrocortisone rectal cream and Tylenol 3 as needed for discomfort. Recommend sitz bath. Follow-up if symptoms worsen or do not improve or would like to seek interventional procedure.    TOBACCO USE, QUIT 03/16/2009   Qualifier: Diagnosis of  By: Tora Perches       Past Medical History, Surgical history, Social history, and Family history were reviewed and updated as appropriate.   Please see review of systems for further details on the patient's review from today.   Objective:   Physical Exam:  There were no vitals taken for this visit.  Physical Exam Constitutional:      General: He is not in acute distress. Musculoskeletal:        General: No deformity.  Neurological:     Mental Status: He is alert and oriented to person, place, and time.     Coordination: Coordination normal.  Psychiatric:        Attention and Perception: Attention and perception normal. He does not perceive auditory or visual hallucinations.        Mood and Affect: Mood is anxious and depressed. Affect is not labile, blunt, angry, tearful or inappropriate.        Speech: Speech normal.        Behavior: Behavior normal.        Thought Content: Thought content normal. Thought content is not paranoid or delusional. Thought content does not include homicidal or suicidal ideation. Thought content does not include homicidal or suicidal plan.        Cognition and Memory: Cognition and memory normal.        Judgment: Judgment normal.     Comments: Insight intact Chronic anxiety and OCD is noted along with chronic depression.  The OCD is no worse and the depression is worse since decreasing Spravato to once weekly.    Lab Review:     Component Value Date/Time   NA 139 11/17/2020 1103   NA 137 12/22/2019 1057   K 4.4 11/17/2020 1103   CL 101 11/17/2020 1103   CO2 29 11/17/2020 1103   GLUCOSE 98 11/17/2020  1103   BUN 17 11/17/2020 1103   BUN 16 12/22/2019 1057   CREATININE 1.08 11/17/2020 1103   CREATININE 1.12 03/20/2020 0928   CALCIUM 9.8 11/17/2020 1103   PROT 7.4 11/17/2020 1103   PROT 7.5 12/22/2019 1057   ALBUMIN 4.6 11/17/2020 1103   ALBUMIN 4.9 12/22/2019 1057   AST 22 11/17/2020 1103   ALT 32 11/17/2020 1103   ALKPHOS 65 11/17/2020 1103   BILITOT 0.4 11/17/2020 1103   BILITOT 0.3 12/22/2019 1057   GFRNONAA 84 12/22/2019 1057   GFRAA 97 12/22/2019 1057       Component Value Date/Time   WBC 5.5 04/12/2019 0845   RBC 4.94 04/12/2019 0845   HGB 14.8 04/12/2019 0845   HCT 43.5 04/12/2019 0845   PLT 188.0 04/12/2019  0845   MCV 88.1 04/12/2019 0845   MCHC 34.1 04/12/2019 0845   RDW 12.9 04/12/2019 0845   LYMPHSABS 2.2 04/12/2019 0845   MONOABS 0.4 04/12/2019 0845   EOSABS 0.1 04/12/2019 0845   BASOSABS 0.0 04/12/2019 0845    No results found for: POCLITH, LITHIUM   No results found for: PHENYTOIN, PHENOBARB, VALPROATE, CBMZ   .res Assessment: Plan:    Rasheem was seen today for follow-up and depression.  Diagnoses and all orders for this visit:  Recurrent major depression resistant to treatment (HCC)  Seasonal affective disorder (HCC)  Obsessive-compulsive disorder, unspecified type  Generalized anxiety disorder  Elevated blood pressure reading  Insomnia, unspecified type   Discussed his treatment resistant depression and alternatives to the Spravato.  He would like to continue the Spravato because of a history of benefit from IV ketamine.  We discussed the side effects of Spravato.  We specifically discussed the dissociation which can be scary at times as well as a risk of hypertension.  The treatment plan would be 84 mg administered twice weekly for approximately 1 month with a goal of dropping the dosage back to 84 mg administered once weekly.  However he is somewhat more depressed with once weekly treatment.  He tolerated the Spravato without unusual  adverse reactions.    His vital signs indicated borderline hypertension prior to the procedure which then went down during the procedure and back up to 150/100.  He had no headache or nausea.  His vital signs were monitored according to the protocol.  See nursing notes for details  We discussed some alternatives to the fluoxetine due to the sexual side effects.  He has never taken Trintellix.  That would be a reasonable alternative with lower risk of sexual side effects than fluoxetine plus additional mechanisms for treatment of depression beyond what the fluoxetine offers.  We will pass this message on to [facility].  This may be considered  Continue current psych meds including fluoxetine 40 mg daily for both depression and OCD.  He is tolerating these medications.  Continue Spravato administration 84 mg dosage twice wekly.  He agrees with this treatment plan.  Meredith Staggers MD, DFAPA Please see After Visit Summary for patient specific instructions.  Future Appointments  Date Time Provider Department Center  12/13/2020  9:00 AM Naida Sleight, PA-C [facility] None    No orders of the defined types were placed in this encounter.   -------------------------------

## 2020-12-13 ENCOUNTER — Ambulatory Visit: Payer: 59 | Admitting: Surgery

## 2020-12-13 ENCOUNTER — Encounter: Payer: Self-pay | Admitting: Surgery

## 2020-12-13 VITALS — BP 121/82 | HR 66 | Ht 72.0 in | Wt 195.0 lb

## 2020-12-13 DIAGNOSIS — G5761 Lesion of plantar nerve, right lower limb: Secondary | ICD-10-CM

## 2020-12-13 NOTE — Progress Notes (Signed)
Office Visit Note   Patient: Albert [name]           Date of Birth: May 11, 1976           MRN: [id number] Visit Date: 12/13/2020              Requested by: Esperanza Richters, PA-C [address] [place],  [place] [address] PCP: Esperanza Richters, PA-C   Assessment & Plan: Visit Diagnoses:  1. Morton's neuroma of right foot (second and third)     Plan: Patient will use over-the-counter NSAID to see how he does over the next couple weeks.  I explained to patient possible diagnosis of Morton's neuroma.  Follow-up with me in 2 weeks for recheck.  If he still continues to describe pain between the second and third toes I will plan to do a Morton's neuroma Marcaine/steroid injection at that time.  We also discussed custom orthotics.  Advised patient that I do not think that the pain that he is currently having is related in any way to gout.  May consider doing a CBC and arthritis panel.  If he does have severe acute flare of his foot pain again recommended that he come into our office to be seen immediately.  All questions answered.  Follow-Up Instructions: Return in about 2 weeks (around 12/27/2020) for with Jecenia Leamer possible morton's neuroma injection.   Orders:  No orders of the defined types were placed in this encounter.  No orders of the defined types were placed in this encounter.     Procedures: No procedures performed   Clinical Data: No additional findings.   Subjective: Chief Complaint  Patient presents with   Right Foot - Follow-up    HPI 45 year old white male returns for recheck of his right foot pain.  Patient states that he was told that he has gout and is wondering about getting another prescription for colchicine.  Dr. Ophelia Charter did blood work to check a serum uric acid last office visit and that was within normal limits at 7.7.  Today patient describes forefoot pain primarily around the second and third metatarsal heads.  Pain with ambulation.  Intermittent  numbness and tingling between the second and third toes. Review of Systems No current cardiac pulmonary GI GU issues  Objective: Vital Signs: BP 121/82   Pulse 66   Ht 6' (1.829 m)   Wt 195 lb (88.5 kg)   BMI 26.45 kg/m   Physical Exam Eyes:     Extraocular Movements: Extraocular movements intact.  Pulmonary:     Effort: No respiratory distress.  Musculoskeletal:     Comments: Gait is normal.  Right ankle unremarkable.  Right foot no swelling or bruising noted.  First MTP joint nontender.  He has he has moderate tenderness between the second and third metatarsal heads.  Sensation intact.  Neurological:     Mental Status: He is alert and oriented to person, place, and time.  Psychiatric:        Mood and Affect: Mood normal.    Ortho Exam  Specialty Comments:  No specialty comments available.  Imaging: No results found.   PMFS History: Patient Active Problem List   Diagnosis Date Noted   Recurrent major depression resistant to treatment (HCC) 11/17/2020   Seasonal affective disorder (HCC) 11/17/2020   Generalized anxiety disorder 11/17/2020   Low testosterone 10/28/2020   Lumbar foraminal stenosis 12/16/2019   Essential hypertension 08/04/2018   OCD (obsessive compulsive disorder) 05/25/2018  Insomnia 05/25/2018   Cardiac risk counseling 07/20/2017   Closed nondisplaced fracture of neck of fifth metacarpal bone of right hand 07/10/2016   Attention deficit hyperactivity disorder (ADHD) 06/19/2016   Thrombosed external hemorrhoid 10/06/2015   CONTACT DERMATITIS&OTHER ECZEMA DUE UNSPEC CAUSE 10/06/2009   NECK PAIN, ACUTE 09/13/2009   TOBACCO USE, QUIT 03/16/2009   DEPRESSION 02/20/2009   Past Medical History:  Diagnosis Date   Attention deficit hyperactivity disorder (ADHD) 06/19/2016   Closed nondisplaced fracture of neck of fifth metacarpal bone of right hand 07/10/2016   CONTACT DERMATITIS&OTHER ECZEMA DUE UNSPEC CAUSE 10/06/2009   Qualifier: Diagnosis of  By:  Yetta Barre MD, Bernadene Bell.    Depression    DEPRESSION 02/20/2009   Qualifier: Diagnosis of  By: Yetta Barre MD, Bernadene Bell.    Hyperlipidemia    NECK PAIN, ACUTE 09/13/2009   Qualifier: Diagnosis of  By: Yetta Barre MD, Bernadene Bell.    Thrombosed external hemorrhoid 10/06/2015   Symptoms and exam consistent with thrombosed external hemorrhoid. Patient declines incise and drain at this time. Treat conservatively with hydrocortisone rectal cream and Tylenol 3 as needed for discomfort. Recommend sitz bath. Follow-up if symptoms worsen or do not improve or would like to seek interventional procedure.    TOBACCO USE, QUIT 03/16/2009   Qualifier: Diagnosis of  By: Tora Perches      Family History  Problem Relation Age of Onset   Healthy Mother    Hyperlipidemia Paternal Grandfather    Heart disease Paternal Grandfather    Heart failure Paternal Grandfather    Other Neg Hx        low testosterone    Past Surgical History:  Procedure Laterality Date   HERNIA REPAIR     Social History   Occupational History   Occupation: Sales  Tobacco Use   Smoking status: Never   Smokeless tobacco: Never   Tobacco comments:    socially smoked years ago.  Vaping Use   Vaping Use: Never used  Substance and Sexual Activity   Alcohol use: Yes    Alcohol/week: 7.0 standard drinks    Types: 3 Glasses of wine, 4 Standard drinks or equivalent per week   Drug use: No   Sexual activity: Yes

## 2020-12-13 NOTE — Progress Notes (Signed)
NURSE VISIT: Pt here for his Spravato treatment for treatment resistant depression. He will receive 84 mg (3 of the 28 mg) nasal sprays today. Pt taken to treatment room. Spravato is stored at the doctor's office per FDA regulations in the safe behind another locked door due to being a schedule CIII medicaton. Pt's medication is delivered by [facility] in [place]. After nasal sprays used they are disposed of per FDA regulations.  Beginning vital signs taken at 2:15 PM 150/95,67 pulse. Pt instructed to blow his nose and recline back 45 degrees. He began his first nasal spray, with 5 minutes between next 2 nasal sprays. Pt brings head phones and an eye mask and relaxes in recliner after receiving his treatment. Pt sleeping after receiving his 3rd dose of Spravato within 30 minutes of treatment. Pt prefers the lights out overhead while resting in treatment room. Pt 40 minute vital sign check at 2:45 PM, B/P 133/67, pulse 62. Pt then resting/sleeping till Dr. Jennelle Human,  comes in near end of treatment to discuss his care and treatments. [name] was out today. Discharge vital signs at 4:15 PM were 150/93, pulse 59. Pt is feeling good at discharge with no complaints. Pt observed a total of 120 minutes with all symptoms of dissociation and sleepiness resolved. No medications given during treatment. Nursing assessed for a total of 45 minutes.    LOT [id number] EXP 2024 MAY

## 2020-12-19 ENCOUNTER — Other Ambulatory Visit: Payer: Self-pay

## 2020-12-19 ENCOUNTER — Encounter: Payer: Self-pay | Admitting: Psychiatry

## 2020-12-19 ENCOUNTER — Ambulatory Visit (INDEPENDENT_AMBULATORY_CARE_PROVIDER_SITE_OTHER): Payer: 59 | Admitting: Psychiatry

## 2020-12-19 ENCOUNTER — Ambulatory Visit: Payer: 59

## 2020-12-19 VITALS — BP 155/91 | HR 70

## 2020-12-19 DIAGNOSIS — G4700 Insomnia, unspecified: Secondary | ICD-10-CM

## 2020-12-19 DIAGNOSIS — F429 Obsessive-compulsive disorder, unspecified: Secondary | ICD-10-CM | POA: Diagnosis not present

## 2020-12-19 DIAGNOSIS — F339 Major depressive disorder, recurrent, unspecified: Secondary | ICD-10-CM

## 2020-12-19 DIAGNOSIS — F411 Generalized anxiety disorder: Secondary | ICD-10-CM

## 2020-12-19 DIAGNOSIS — F902 Attention-deficit hyperactivity disorder, combined type: Secondary | ICD-10-CM

## 2020-12-19 MED ORDER — SPRAVATO (84 MG DOSE) 28 MG/DEVICE NA SOPK: 84.0000 mg | PACK | NASAL | 0 refills | Status: DC

## 2020-12-19 NOTE — Progress Notes (Signed)
[name] [id number] Nov 26, 1975 45 y.o.  Subjective:   Patient ID:  Albert [name] is a 45 y.o. (DOB 1976-01-01) male.  Chief Complaint:  Chief Complaint  Patient presents with   Follow-up   Depression   Medication Problem   Anxiety    HPI Albert [name] presents to the office today for follow-up of TRD and anxiety disorders.  IV ketamine Sept 2021 #6 and it helped depression.  This made him want to pursue Spravato for treatment of his depression.  The depression relapsed after he stopped receiving the IV ketamine.   12/12/20 appt noted" Patient receive Spravato 84 mg today.  He states it seems more intense after transitioning from twice weekly to once weekly.  However in general he tolerated it well without complications.  The dissociation resolved over the course of 2-hour observation..  No unusual adverse effects such as nausea or vomiting headache, hallucinations or psychosis.  He finds the Spravato markedly helpful and would like to continue it. Overall his depression he rates as a 5/10 compared to a 2/10 when he was receiving Spravato twice weekly.  He has been at once weekly for about 3 weeks.  He reduced to once weekly for practical reasons.  He does not feel as good with regard to depression as he did when received receiving Spravato twice weekly.  He tried reducing Prozac due to sexual side effects but felt more depressed and increased back to 40 mg daily.  He wonders if there are alternatives.  He is not markedly anxious.  He is not suicidal.  Plan: administer Spravato Start transition to Trintellix  12/19/20 appt noted: Patient received Spravato 84 mg today.  He had no adverse reaction to it and specifically no unusual headache nausea or vomiting or dizziness.  He did have the expected dissociation but this is less intense than what he experienced with IV ketamine.  The dissociation resolved over the 2-hour observation.Marland Kitchen  He experiences immediate benefit from depression with  Spravato which diminishes a little over the course between treatments.  However he is already improved significantly from before starting Spravato.  He still has anxiety and OCD symptoms but they are not overwhelming at the present and depression is his primary concern.  This includes a diminished enjoyment of things but no suicidal thoughts.  Past medications for mental health diagnoses include: Luvox 200, Prozac 40,  Wellbutrin,  Risperdal, Abilify, Lunesta, Xanax, Ambien,  Deplin,  naltrexone,  Adderall   ECT-MADRS    Flowsheet Row Office Visit from 12/05/2020 in [facility] Office Visit from 11/28/2020 in [facility] Office Visit from 11/14/2020 in [facility]  MADRS Total Score 2 8 12       PHQ2-9    Flowsheet Row Telemedicine from 08/30/2020 in [facility]  PHQ-2 Total Score 6  PHQ-9 Total Score 20      Flowsheet Row Telemedicine from 08/30/2020 in [facility]  C-SSRS RISK CATEGORY Error: Q3, 4, or 5 should not be populated when Q2 is No        Review of Systems:  Review of Systems  Cardiovascular:  Negative for chest pain and palpitations.  Neurological:  Negative for tremors and weakness.   Medications: I have reviewed the patient's current medications.  Current Outpatient Medications  Medication Sig Dispense Refill   Colchicine (MITIGARE) 0.6 MG CAPS Take 1 capsule by mouth 2 (two) times daily as needed. 40 capsule 0   Esketamine HCl, 84 MG Dose, (  SPRAVATO, 84 MG DOSE,) 28 MG/DEVICE SOPK Place 84 mg into the nose once a week. 3 each 0   ezetimibe (ZETIA) 10 MG tablet Take 1 tablet (10 mg total) by mouth daily. 90 tablet 3   telmisartan-hydrochlorothiazide (MICARDIS HCT) 80-12.5 MG tablet Take 1 tablet by mouth daily. 90 tablet 1   testosterone cypionate (DEPO-TESTOSTERONE) 100 MG/ML injection Inject 0.3 mLs (30 mg total) into the muscle once a week. And syringes 1/week 10 mL 0    vortioxetine HBr (TRINTELLIX) 20 MG TABS tablet Take 1 tablet (20 mg total) by mouth daily. 30 tablet 5   No current facility-administered medications for this visit.    Medication Side Effects: None  Allergies:  Allergies  Allergen Reactions   Sulfamethoxazole     Other reaction(s): Arthralgias (intolerance)   Sulfonamide Derivatives     REACTION: Rash Stiff joints    Past Medical History:  Diagnosis Date   Attention deficit hyperactivity disorder (ADHD) 06/19/2016   Closed nondisplaced fracture of neck of fifth metacarpal bone of right hand 07/10/2016   CONTACT DERMATITIS&OTHER ECZEMA DUE UNSPEC CAUSE 10/06/2009   Qualifier: Diagnosis of  By: Yetta Barre MD, Bernadene Bell.    Depression    DEPRESSION 02/20/2009   Qualifier: Diagnosis of  By: Yetta Barre MD, Bernadene Bell.    Hyperlipidemia    NECK PAIN, ACUTE 09/13/2009   Qualifier: Diagnosis of  By: Yetta Barre MD, Bernadene Bell.    Thrombosed external hemorrhoid 10/06/2015   Symptoms and exam consistent with thrombosed external hemorrhoid. Patient declines incise and drain at this time. Treat conservatively with hydrocortisone rectal cream and Tylenol 3 as needed for discomfort. Recommend sitz bath. Follow-up if symptoms worsen or do not improve or would like to seek interventional procedure.    TOBACCO USE, QUIT 03/16/2009   Qualifier: Diagnosis of  By: Tora Perches      Past Medical History, Surgical history, Social history, and Family history were reviewed and updated as appropriate.   Please see review of systems for further details on the patient's review from today.   Objective:   Physical Exam:  There were no vitals taken for this visit.  Physical Exam Constitutional:      General: He is not in acute distress. Musculoskeletal:        General: No deformity.  Neurological:     Mental Status: He is alert and oriented to person, place, and time.     Coordination: Coordination normal.  Psychiatric:        Attention and Perception: Attention and  perception normal. He does not perceive auditory or visual hallucinations.        Mood and Affect: Mood is anxious and depressed. Affect is not labile, blunt, angry or inappropriate.        Speech: Speech normal.        Behavior: Behavior normal.        Thought Content: Thought content normal. Thought content is not paranoid or delusional. Thought content does not include homicidal or suicidal ideation. Thought content does not include homicidal or suicidal plan.        Cognition and Memory: Cognition and memory normal.        Judgment: Judgment normal.     Comments: Insight intact Anxiety includes residual OC sx    Lab Review:     Component Value Date/Time   NA 139 11/17/2020 1103   NA 137 12/22/2019 1057   K 4.4 11/17/2020 1103   CL 101 11/17/2020 1103   CO2  29 11/17/2020 1103   GLUCOSE 98 11/17/2020 1103   BUN 17 11/17/2020 1103   BUN 16 12/22/2019 1057   CREATININE 1.08 11/17/2020 1103   CREATININE 1.12 03/20/2020 0928   CALCIUM 9.8 11/17/2020 1103   PROT 7.4 11/17/2020 1103   PROT 7.5 12/22/2019 1057   ALBUMIN 4.6 11/17/2020 1103   ALBUMIN 4.9 12/22/2019 1057   AST 22 11/17/2020 1103   ALT 32 11/17/2020 1103   ALKPHOS 65 11/17/2020 1103   BILITOT 0.4 11/17/2020 1103   BILITOT 0.3 12/22/2019 1057   GFRNONAA 84 12/22/2019 1057   GFRAA 97 12/22/2019 1057       Component Value Date/Time   WBC 7.7 01/25/2021 1526   RBC 4.88 01/25/2021 1526   HGB 15.1 01/25/2021 1526   HCT 44.3 01/25/2021 1526   PLT 215 01/25/2021 1526   MCV 90.8 01/25/2021 1526   MCH 30.9 01/25/2021 1526   MCHC 34.1 01/25/2021 1526   RDW 12.2 01/25/2021 1526   LYMPHSABS 2.2 04/12/2019 0845   MONOABS 0.4 04/12/2019 0845   EOSABS 0.1 04/12/2019 0845   BASOSABS 0.0 04/12/2019 0845    No results found for: POCLITH, LITHIUM   No results found for: PHENYTOIN, PHENOBARB, VALPROATE, CBMZ   .res Assessment: Plan:    Albert [name] was seen today for follow-up, depression, medication problem and  anxiety.  Diagnoses and all orders for this visit:  Recurrent major depression resistant to treatment (HCC)  Obsessive-compulsive disorder, unspecified type  Generalized anxiety disorder  Insomnia, unspecified type  Attention deficit hyperactivity disorder (ADHD), combined type   Discussed his treatment resistant depression and alternatives to the Spravato.  He would like to continue the Spravato because of a history of benefit from IV ketamine.  We discussed the side effects of Spravato.  We specifically discussed the dissociation which can be scary at times as well as a risk of hypertension.  The overall treatment plan has included twice weekly Spravato 84 mg dosage followed by a transition to once weekly.  He is agreed overall to this plan and feels he is tolerated it well. Patient was observed from 2:00 to 4 PM.  See nursing notes for additional information including vital signs  He has started the transition to Trintellix from fluoxetine and is tolerating it so far.  It is hoped that he will have less sexual side effects and less blunting and perhaps a better antidepressant effect.  He is not having suicidal thoughts.  There is some concern that Trintellix may not be as effective for OCD and anxiety as fluoxetine has been and he was cautioned about this.  So far he has not noticed a problem.  Follow-up is scheduled for next Spravato administration  Meredith Staggers MD, DFAPA  Please see After Visit Summary for patient specific instructions.  No future appointments.   No orders of the defined types were placed in this encounter.   -------------------------------

## 2020-12-22 ENCOUNTER — Other Ambulatory Visit: Payer: Self-pay | Admitting: Physician Assistant

## 2020-12-24 NOTE — Progress Notes (Signed)
NURSE VISIT: Pt here for his Spravato treatment for treatment resistant depression. He will receive 84 mg (3 of the 28 mg) nasal sprays today. Pt taken to treatment room. Spravato is stored at the doctor's office per FDA regulations in the safe behind another locked door due to being a schedule CIII medicaton. Pt's medication is delivered by [facility] in [place]. After nasal sprays used they are disposed of per FDA regulations.  Beginning vital signs taken at 1:55 PM 153/88,71 pulse. Pt instructed to blow his nose and recline back 45 degrees. He began his first nasal spray, with 5 minutes between next 2 nasal sprays. Pt brings head phones and an eye mask and relaxes in recliner after receiving his treatment. Pt sleeping after receiving his 3rd dose of Spravato within 30 minutes of treatment. Pt prefers the lights out overhead while resting in treatment room. Pt 40 minute vital sign check at 2:40 PM, B/P 148/80, pulse 70. Pt then resting/sleeping till Dr. Jennelle Human,  comes in near end of treatment to discuss his care and treatments. [name] was out today. Discharge vital signs at 4:00 PM were 155/91, pulse 70. Pt is feeling good at discharge with no complaints. Pt observed a total of 120 minutes with all symptoms of dissociation and sleepiness resolved. No medications given during treatment. Nursing assessed for a total of 45 minutes.     LOT [id number] EXP 2024 MAY

## 2020-12-26 ENCOUNTER — Encounter: Payer: Self-pay | Admitting: Physician Assistant

## 2020-12-26 ENCOUNTER — Ambulatory Visit (INDEPENDENT_AMBULATORY_CARE_PROVIDER_SITE_OTHER): Payer: 59 | Admitting: Physician Assistant

## 2020-12-26 ENCOUNTER — Other Ambulatory Visit: Payer: Self-pay

## 2020-12-26 ENCOUNTER — Ambulatory Visit: Payer: 59

## 2020-12-26 ENCOUNTER — Other Ambulatory Visit: Payer: Self-pay | Admitting: Physician Assistant

## 2020-12-26 VITALS — BP 108/85

## 2020-12-26 VITALS — BP 151/81 | HR 62

## 2020-12-26 DIAGNOSIS — F411 Generalized anxiety disorder: Secondary | ICD-10-CM | POA: Diagnosis not present

## 2020-12-26 DIAGNOSIS — F339 Major depressive disorder, recurrent, unspecified: Secondary | ICD-10-CM | POA: Diagnosis not present

## 2020-12-26 DIAGNOSIS — F429 Obsessive-compulsive disorder, unspecified: Secondary | ICD-10-CM

## 2020-12-26 DIAGNOSIS — F19981 Other psychoactive substance use, unspecified with psychoactive substance-induced sexual dysfunction: Secondary | ICD-10-CM | POA: Diagnosis not present

## 2020-12-26 MED ORDER — VORTIOXETINE HBR 20 MG PO TABS: 20.0000 mg | ORAL_TABLET | Freq: Every day | ORAL | 1 refills | Status: DC

## 2020-12-26 NOTE — Progress Notes (Signed)
[facility]  Patient ID: Albert [name],  MRN: [id number]  PCP: Albert Richters, PA-C  Date of Evaluation: 12/26/2020  time spent:40 minutes  Chief Complaint:  Chief Complaint   Depression; Anxiety; Follow-up      HISTORY/CURRENT STATUS: HPI For Spravato # 14  Last Spravato was 7 days ago.  He continues to do well with the treatment just once a week now for the past month or so.  He is able to enjoy things.  Energy and motivation are good.  He is able to work without difficulty.  Sleeps well most of the time.  He does not isolate.  Appetite is good and weight is stable.  No suicidal or homicidal thoughts.  No side effects from the Spravato are reported.  In the past he has had some insomnia on the night he has had the treatment but that is not a problem now.  OCD symptoms are well controlled.  He still has ruminating thoughts sometimes but not nearly like they were.  Anxiety is controlled.  The Prozac has been effective.  However it continues to cause sexual dysfunction.  He and Dr. Jennelle [name] discussed this at the last visit, and Dr. Jennelle [name] recommended changing to Trintellix.  He has already started 10 mg of that and has decreased Prozac to 20 mg for the past 4 days or so.  He can already tell some difference with libido.  Work is going well. States that attention is good without easy distractibility.  Able to focus on things and finish tasks to completion.   At the end of the 2-hour observation, he reports that he is doing well.  Experienced dissociation, very relaxed for about an hour and now feels kind of groggy but otherwise okay. Denies headache or dizziness.   Review of Systems  Constitutional: Negative.   HENT: Negative.    Eyes: Negative.   Respiratory: Negative.    Cardiovascular: Negative.   Gastrointestinal: Negative.   Genitourinary: Negative.   Musculoskeletal: Negative.   Skin: Negative.   Neurological: Negative.   Endo/Heme/Allergies: Negative.    Psychiatric/Behavioral:         See HPI    Individual Medical History/ Review of Systems: Changes? :No   Past medications for mental health diagnoses include: Luvox, Lunesta, Xanax, Wellbutrin, Risperdal, Prozac, Ambien, Deplin, Abilify, naltrexone, Adderall, Spravato  Allergies: Sulfamethoxazole and Sulfonamide derivatives  Current Medications:  Current Outpatient Medications:    clomiPHENE (CLOMID) 50 MG tablet, TAKE 1/2 TABLET BY MOUTH EVERY DAY, Disp: 45 tablet, Rfl: 3   colchicine 0.6 MG tablet, Take one bid prn, Disp: 20 tablet, Rfl: 0   Esketamine HCl, 84 MG Dose, (SPRAVATO, 84 MG DOSE,) 28 MG/DEVICE SOPK, Place 84 mg into the nose 2 (two) times a week., Disp: 3 each, Rfl: 0   eszopiclone (LUNESTA) 2 MG TABS tablet, Take 1 tablet (2 mg total) by mouth at bedtime as needed for sleep. Take immediately before bedtime, Disp: 30 tablet, Rfl: 5   ezetimibe (ZETIA) 10 MG tablet, Take 1 tablet (10 mg total) by mouth daily., Disp: 90 tablet, Rfl: 3   LORazepam (ATIVAN) 0.5 MG tablet, Take 0.5-1 tablets (0.25-0.5 mg total) by mouth 2 (two) times daily as needed for anxiety., Disp: 30 tablet, Rfl: 1   telmisartan-hydrochlorothiazide (MICARDIS HCT) 80-12.5 MG tablet, Take 1 tablet by mouth daily., Disp: 90 tablet, Rfl: 1   vortioxetine HBr (TRINTELLIX) 20 MG TABS tablet, Take 1 tablet (20 mg total) by mouth daily., Disp: 30 tablet, Rfl:  1 Medication Side Effects: sexual dysfunction  Family Medical/ Social History: Changes? No  MENTAL HEALTH EXAM:  Before Spravato-BP 108/85  40 minutes after administration of Spravato-BP 130/73  2 hours after administration of Spravato-BP 151/81   Blood pressure 108/85.There is no height or weight on file to calculate BMI.  General Appearance: Casual and Well Groomed.  Eye Contact:  Good  Speech:  Clear and Coherent and Normal Rate  Volume:  Normal  Mood:  Euthymic  Affect:  Appropriate  Thought Process:  Goal Directed and Descriptions of  Associations: Circumstantial  Orientation:  Full (Time, Place, and Person)  Thought Content: Logical   Suicidal Thoughts:  No  Homicidal Thoughts:  No  Memory:  WNL  Judgement:  Good  Insight:  Good  Psychomotor Activity:  Normal  Concentration:  Concentration: Good and Attention Span: Good  Recall:  Good  Fund of Knowledge: Good  Language: Good  Assets:  Desire for Improvement Financial Resources/Insurance Housing Physical Health Resilience Social Support Transportation Vocational/Educational  ADL's:  Intact  Cognition: WNL  Prognosis:  Good   See MADRS score  DIAGNOSES:    ICD-10-CM   1. 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 Obsessive-compulsive disorder, unspecified type  F42.9     3. Generalized anxiety disorder  F41.1     4. 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   I provided 40 minutes of face to face time during this encounter, including time spent before and after the visit in records review, complex medical decision making, and charting.  I am glad he is still doing well with the Spravato.  And changing Prozac to Trintellix is a great idea.  I am glad that has already been initiated and he is feeling a difference already.  I think he will need to max out the Trintellix because of the fact he has had a better response with depression, anxiety and OCD symptoms on a higher dose of Prozac.  For that reason I will send in a higher dose but he will finish out a week of Trintellix 10 mg before going up.  Samples were given of 10 mg, take 2 pills daily after 1 more week, enough meds were given so he will have enough while waiting for the prior authorization to go through.  He will finish out the Prozac 20 mg that he has for the next 4 days and then just stop it. Increase Trintellix to 20 mg, 1 p.o. daily, in 1 week. Continue Spravato 84  mg weekly.  (He will be out of town next week but will return in 2 weeks.) Continue Lunesta 2 mg, 1 p.o. nightly as needed. Continue Ativan 0.5 mg, 1 p.o. twice daily as needed. Return in 2 weeks.  Albert Overly, PA-C

## 2020-12-27 ENCOUNTER — Ambulatory Visit: Payer: 59 | Admitting: Surgery

## 2020-12-28 ENCOUNTER — Other Ambulatory Visit: Payer: Self-pay

## 2020-12-28 ENCOUNTER — Telehealth: Payer: Self-pay | Admitting: Physician Assistant

## 2020-12-28 MED ORDER — SPRAVATO (84 MG DOSE) 28 MG/DEVICE NA SOPK: PACK | NASAL | 0 refills | Status: DC

## 2020-12-28 NOTE — Telephone Encounter (Signed)
Please resend Spravato RX for Albert [name] to correct to once per week. Pharmacy cannot run for twice per week.

## 2020-12-28 NOTE — Telephone Encounter (Signed)
This just needs a prior authorization. Pending at this time

## 2020-12-28 NOTE — Telephone Encounter (Signed)
Rx was sent  

## 2020-12-29 NOTE — Progress Notes (Signed)
NURSE VISIT:   Pt here for his Spravato treatment for treatment resistant depression. He will receive 84 mg (3 of the 28 mg) nasal sprays today. Pt taken to treatment room. Spravato is stored at the doctor's office per FDA regulations in the safe behind another locked door due to being a schedule CIII medicaton. Pt's medication is delivered by [facility] in [place]. After nasal sprays used they are disposed of per FDA regulations.  Beginning vital signs taken at 2:10 PM 130/72,58 pulse. Pt instructed to blow his nose and recline back 45 degrees. He began his first nasal spray, with 5 minutes between next 2 nasal sprays. Pt brings head phones and an eye mask and relaxes in recliner after receiving his treatment. Pt sleeping after receiving his 3rd dose of Spravato within 30 minutes of treatment. Pt prefers the lights out overhead while resting in treatment room. Pt 40 minute vital sign check at 2:55 PM, B/P 142/81, pulse 65. Pt then resting/sleeping till Rosey Bath arrives, she comes in near end of treatment to discuss his care and treatments. [name] was out today. Discharge vital signs at 4:05 PM were 151/81, pulse 62. Pt is feeling good at discharge with no complaints. Pt observed a total of 120 minutes with all symptoms of dissociation and sleepiness resolved. No medications given during treatment. Nursing assessed for a total of 45 minutes.     LOT [id number] EXP 2023 OCT

## 2021-01-09 ENCOUNTER — Ambulatory Visit: Payer: 59

## 2021-01-09 ENCOUNTER — Ambulatory Visit (INDEPENDENT_AMBULATORY_CARE_PROVIDER_SITE_OTHER): Payer: 59 | Admitting: Physician Assistant

## 2021-01-09 ENCOUNTER — Other Ambulatory Visit: Payer: Self-pay

## 2021-01-09 ENCOUNTER — Ambulatory Visit: Payer: 59 | Admitting: Psychiatry

## 2021-01-09 VITALS — BP 138/81 | HR 69

## 2021-01-09 VITALS — BP 155/96 | HR 63

## 2021-01-09 DIAGNOSIS — F339 Major depressive disorder, recurrent, unspecified: Secondary | ICD-10-CM | POA: Diagnosis not present

## 2021-01-09 DIAGNOSIS — F429 Obsessive-compulsive disorder, unspecified: Secondary | ICD-10-CM | POA: Diagnosis not present

## 2021-01-09 DIAGNOSIS — F411 Generalized anxiety disorder: Secondary | ICD-10-CM | POA: Diagnosis not present

## 2021-01-09 NOTE — Progress Notes (Signed)
[facility]  Patient ID: Albert [name],  MRN: [id number]  PCP: Esperanza Richters, PA-C  Date of Evaluation: 01/09/2021  time spent:40 minutes  Chief Complaint:  Chief Complaint   Follow-up; Depression; Other      HISTORY/CURRENT STATUS: HPI For Spravato   Last Spravato was 2 wks ago.Marland Kitchen  He went out of town and was unable to come last week.  Reports feeling more "down" after about a week until he was able to get to this appointment.  But usually when he has the treatments weekly he feels well for the entire week.   He is able to enjoy things.  Energy and motivation are good.  He is able to work without difficulty.  Sleeps well most of the time.  He does not isolate.  Appetite is good and weight is stable.  No suicidal or homicidal thoughts.  OCD symptoms are well controlled.  He still has ruminating thoughts sometimes but not nearly like they were.  Anxiety is controlled.  He continues to transition from Prozac to Trintellix.  Sexual side effects are improving.  Work is going well. States that attention is good without easy distractibility.  Able to focus on things and finish tasks to completion.   He continues to tolerate the Spravato just fine.  While here today he did report a dissociative experience.  Nothing scary.  He was able to come out of the grogginess after an hour and a half or so.  Denies headache, dizziness, blurred vision, slurred speech, or nausea and vomiting.  Review of Systems  Constitutional: Negative.   HENT: Negative.    Eyes: Negative.   Respiratory: Negative.    Cardiovascular: Negative.   Gastrointestinal: Negative.   Genitourinary: Negative.   Musculoskeletal: Negative.   Skin: Negative.   Neurological: Negative.   Endo/Heme/Allergies: Negative.   Psychiatric/Behavioral:         See HPI    Individual Medical History/ Review of Systems: Changes? :No   Past medications for mental health diagnoses include: Luvox, Lunesta, Xanax,  Wellbutrin, Risperdal, Prozac, Ambien, Deplin, Abilify, naltrexone, Adderall, Spravato  Allergies: Sulfamethoxazole and Sulfonamide derivatives  Current Medications:  Current Outpatient Medications:    clomiPHENE (CLOMID) 50 MG tablet, TAKE 1/2 TABLET BY MOUTH EVERY DAY, Disp: 45 tablet, Rfl: 3   Colchicine (MITIGARE) 0.6 MG CAPS, Take 1 capsule by mouth 2 (two) times daily as needed., Disp: 40 capsule, Rfl: 0   colchicine 0.6 MG tablet, Take one bid prn, Disp: 40 tablet, Rfl: 0   Esketamine HCl, 84 MG Dose, (SPRAVATO, 84 MG DOSE,) 28 MG/DEVICE SOPK, Place 84 mg dose into nostrils once a week, Disp: 3 each, Rfl: 0   eszopiclone (LUNESTA) 2 MG TABS tablet, Take 1 tablet (2 mg total) by mouth at bedtime as needed for sleep. Take immediately before bedtime, Disp: 30 tablet, Rfl: 5   ezetimibe (ZETIA) 10 MG tablet, Take 1 tablet (10 mg total) by mouth daily., Disp: 90 tablet, Rfl: 3   LORazepam (ATIVAN) 0.5 MG tablet, Take 0.5-1 tablets (0.25-0.5 mg total) by mouth 2 (two) times daily as needed for anxiety., Disp: 30 tablet, Rfl: 1   predniSONE (STERAPRED UNI-PAK 21 TAB) 10 MG (21) TBPK tablet, Take as directed., Disp: 21 tablet, Rfl: 0   telmisartan-hydrochlorothiazide (MICARDIS HCT) 80-12.5 MG tablet, Take 1 tablet by mouth daily., Disp: 90 tablet, Rfl: 1   vortioxetine HBr (TRINTELLIX) 20 MG TABS tablet, Take 1 tablet (20 mg total) by mouth daily. (Patient taking differently: Take  15 mg by mouth daily.), Disp: 30 tablet, Rfl: 1 Medication Side Effects: sexual dysfunction  Family Medical/ Social History: Changes? No  MENTAL HEALTH EXAM:  Before Spravato-BP 138/81.  Pulse 69. 40 minutes after administration of Spravato-BP 135/75, pulse 65 2 hours after administration of Spravato-BP 155/96, pulse 63.   Blood pressure 138/81, pulse 69.There is no height or weight on file to calculate BMI.  General Appearance: Casual and Well Groomed.  Eye Contact:  Good  Speech:  Clear and Coherent and  Normal Rate  Volume:  Normal  Mood:  Euthymic  Affect:  Appropriate  Thought Process:  Goal Directed and Descriptions of Associations: Intact  Orientation:  Full (Time, Place, and Person)  Thought Content: Logical   Suicidal Thoughts:  No  Homicidal Thoughts:  No  Memory:  WNL  Judgement:  Good  Insight:  Good  Psychomotor Activity:  Normal  Concentration:  Concentration: Good and Attention Span: Good  Recall:  Good  Fund of Knowledge: Good  Language: Good  Assets:  Desire for Improvement Financial Resources/Insurance Housing Physical Health Resilience Social Support Transportation Vocational/Educational  ADL's:  Intact  Cognition: WNL  Prognosis:  Good    DIAGNOSES:    ICD-10-CM   1. 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 Obsessive-compulsive disorder, unspecified type  F42.9     3. Generalized anxiety disorder  F41.1        Receiving Psychotherapy: No    RECOMMENDATIONS:  PDMP was reviewed.   I provided 40 minutes of face to face time during this encounter, including time spent before and after the visit in records review, complex medical decision making, and charting.  He continues to do well with the Spravato so no changes will be made.   Continue Spravato 84 mg weekly. Continue Trintellix total of 15 mg daily.    Continue Lunesta 2 mg, 1 p.o. nightly as needed. Continue Ativan 0.5 mg, 1 p.o. twice daily as needed. Return in 1 wk.  [name], PA-C

## 2021-01-10 ENCOUNTER — Other Ambulatory Visit: Payer: Self-pay

## 2021-01-10 MED ORDER — SPRAVATO (84 MG DOSE) 28 MG/DEVICE NA SOPK: PACK | NASAL | 0 refills | Status: DC

## 2021-01-10 NOTE — Telephone Encounter (Signed)
Prior Authorization submitted and approved for TRINTELLIX 20 MG effective 01/10/2021-01/10/2022, [id number]

## 2021-01-10 NOTE — Progress Notes (Signed)
NURSE VISIT:    Pt here for his Spravato treatment for treatment resistant depression. He will receive 84 mg (3 of the 28 mg) nasal sprays today. Pt taken to treatment room. Spravato is stored at the doctor's office per FDA regulations in the safe behind another locked door due to being a schedule CIII medicaton. Pt's medication is delivered by [facility] in [place]. After nasal sprays used they are disposed of per FDA regulations.  Pt did not receive a treatment last week due to being out of town for business. Informed him he would probably feel more dissociation this week due to missing last week. His beginning vital signs taken at 2:00 PM 138/81,69 pulse. Pt instructed to blow his nose and recline back 45 degrees. He began his first nasal spray, with 5 minutes between next 2 nasal sprays. Pt brings head phones and an eye mask and relaxes in recliner after receiving his treatment. Pt sleeping after receiving his 3rd dose of Spravato within 30 minutes of treatment. Pt prefers the lights out overhead while resting in treatment room. Pt 40 minute vital sign check at 2:45 PM, B/P 135/75, pulse 65. Pt then resting/sleeping till Rosey Bath arrives near end of treatment to discuss his care.  Discharge vital signs at 3:55 PM were 155/96, pulse 63. Pt is feeling good at discharge with no complaints. Pt observed a total of 120 minutes with all symptoms of dissociation and sleepiness resolved. No medications given during treatment. Nursing assessed for a total of 45 minutes.  Pt will be scheduled next Tuesday, August 9th at 2 pm.    LOT [id number] EXP 2024 MAY

## 2021-01-16 ENCOUNTER — Other Ambulatory Visit: Payer: Self-pay

## 2021-01-16 ENCOUNTER — Ambulatory Visit: Payer: 59 | Admitting: Physician Assistant

## 2021-01-16 ENCOUNTER — Ambulatory Visit (INDEPENDENT_AMBULATORY_CARE_PROVIDER_SITE_OTHER): Payer: 59 | Admitting: Psychiatry

## 2021-01-16 ENCOUNTER — Ambulatory Visit: Payer: 59 | Admitting: Psychiatry

## 2021-01-16 ENCOUNTER — Encounter: Payer: Self-pay | Admitting: Psychiatry

## 2021-01-16 ENCOUNTER — Ambulatory Visit: Payer: 59

## 2021-01-16 VITALS — BP 142/88 | HR 65

## 2021-01-16 DIAGNOSIS — F429 Obsessive-compulsive disorder, unspecified: Secondary | ICD-10-CM

## 2021-01-16 DIAGNOSIS — F411 Generalized anxiety disorder: Secondary | ICD-10-CM | POA: Diagnosis not present

## 2021-01-16 DIAGNOSIS — F19981 Other psychoactive substance use, unspecified with psychoactive substance-induced sexual dysfunction: Secondary | ICD-10-CM

## 2021-01-16 DIAGNOSIS — F902 Attention-deficit hyperactivity disorder, combined type: Secondary | ICD-10-CM

## 2021-01-16 DIAGNOSIS — F339 Major depressive disorder, recurrent, unspecified: Secondary | ICD-10-CM

## 2021-01-16 DIAGNOSIS — G4700 Insomnia, unspecified: Secondary | ICD-10-CM

## 2021-01-16 MED ORDER — SPRAVATO (84 MG DOSE) 28 MG/DEVICE NA SOPK: PACK | NASAL | 0 refills | Status: DC

## 2021-01-16 NOTE — Progress Notes (Signed)
Albert [name] [id number] September 04, 1975 45 y.o.  Subjective:   Patient ID:  Albert [name] is a 45 y.o. (DOB 06/02/1976) male.  Chief Complaint:  Chief Complaint  Patient presents with   Follow-up    Spravato   Depression   Anxiety   Medication Reaction    HPI Albert [name] presents to the office today for follow-up of treatment resistant major depression, anxiety disorders, medication changes, and Spravato administration.  Last Spravato administration was January 09, 2021 of 84 mg was tolerated well.  01/16/2021 appointment with the following noted: Tolerating Spravato well.  Missed a couple weeks while out of town and noticed some depression. On Trintellix 10 alone for a week but didn't feel it was enough.  No sig nausea at current 15 mg daily but did at the beginning. Sexual SE not gone fully after off it for 2 weeks.  He wants to try it longer.  Anxiety is OK so far with the change.  He feels less emotionally blunted and blah.  Wife noticed he was blah on Luvox and some on fluoxetine.  Received Spravato 84 mg today.  He experienced the typical dissociation which was not severe.  No unusual nausea or vomiting or diarrhea or headache.  Blood pressure was good.  He is continuing to experience an immediate lifting of the mood each time he received the Spravato.  So far anxiety is not worse in the transition to [employer] Office Visit from 12/26/2020 in [facility] Office Visit from 12/05/2020 in [facility] Office Visit from 11/28/2020 in [facility] Office Visit from 11/14/2020 in [facility]  MADRS Total Score 1 2 8 12       PHQ2-9    Flowsheet Row Telemedicine from 08/30/2020 in [facility]  PHQ-2 Total Score 6  PHQ-9 Total Score 20      Flowsheet Row Telemedicine from 08/30/2020 in [facility]  C-SSRS RISK CATEGORY Error: Q3, 4, or 5 should not be populated  when Q2 is No        Review of Systems:  Review of Systems  Cardiovascular:  Negative for palpitations.  Neurological:  Negative for tremors and weakness.   Medications: I have reviewed the patient's current medications.  Current Outpatient Medications  Medication Sig Dispense Refill   Colchicine (MITIGARE) 0.6 MG CAPS Take 1 capsule by mouth 2 (two) times daily as needed. 40 capsule 0   Esketamine HCl, 84 MG Dose, (SPRAVATO, 84 MG DOSE,) 28 MG/DEVICE SOPK Place 84 mg into the nose once a week. 3 each 0   ezetimibe (ZETIA) 10 MG tablet Take 1 tablet (10 mg total) by mouth daily. 90 tablet 3   telmisartan-hydrochlorothiazide (MICARDIS HCT) 80-12.5 MG tablet Take 1 tablet by mouth daily. 90 tablet 1   testosterone cypionate (DEPO-TESTOSTERONE) 100 MG/ML injection Inject 0.3 mLs (30 mg total) into the muscle once a week. And syringes 1/week 10 mL 0   vortioxetine HBr (TRINTELLIX) 20 MG TABS tablet Take 1 tablet (20 mg total) by mouth daily. 30 tablet 5   No current facility-administered medications for this visit.    Medication Side Effects: None  Allergies:  Allergies  Allergen Reactions   Sulfamethoxazole     Other reaction(s): Arthralgias (intolerance)   Sulfonamide Derivatives     REACTION: Rash Stiff joints    Past Medical History:  Diagnosis Date   Attention deficit hyperactivity disorder (ADHD) 06/19/2016   Closed  nondisplaced fracture of neck of fifth metacarpal bone of right hand 07/10/2016   CONTACT DERMATITIS&OTHER ECZEMA DUE UNSPEC CAUSE 10/06/2009   Qualifier: Diagnosis of  By: Yetta Barre MD, Bernadene Bell.    Depression    DEPRESSION 02/20/2009   Qualifier: Diagnosis of  By: Yetta Barre MD, Bernadene Bell.    Hyperlipidemia    NECK PAIN, ACUTE 09/13/2009   Qualifier: Diagnosis of  By: Yetta Barre MD, Bernadene Bell.    Thrombosed external hemorrhoid 10/06/2015   Symptoms and exam consistent with thrombosed external hemorrhoid. Patient declines incise and drain at this time. Treat conservatively  with hydrocortisone rectal cream and Tylenol 3 as needed for discomfort. Recommend sitz bath. Follow-up if symptoms worsen or do not improve or would like to seek interventional procedure.    TOBACCO USE, QUIT 03/16/2009   Qualifier: Diagnosis of  By: Tora Perches      Past Medical History, Surgical history, Social history, and Family history were reviewed and updated as appropriate.   Please see review of systems for further details on the patient's review from today.   Objective:   Physical Exam:  There were no vitals taken for this visit.  Physical Exam Constitutional:      General: He is not in acute distress. Musculoskeletal:        General: No deformity.  Neurological:     Mental Status: He is alert and oriented to person, place, and time.     Coordination: Coordination normal.  Psychiatric:        Attention and Perception: Attention and perception normal. He does not perceive auditory or visual hallucinations.        Mood and Affect: Mood is anxious and depressed. Affect is not labile, blunt, angry or inappropriate.        Speech: Speech normal.        Behavior: Behavior normal.        Thought Content: Thought content normal. Thought content is not paranoid or delusional. Thought content does not include homicidal or suicidal ideation. Thought content does not include homicidal or suicidal plan.        Cognition and Memory: Cognition and memory normal.        Judgment: Judgment normal.     Comments: Insight intact Residual depression and anxiety but improved with Spravato Residual OCD which is chronic    Lab Review:     Component Value Date/Time   NA 139 11/17/2020 1103   NA 137 12/22/2019 1057   K 4.4 11/17/2020 1103   CL 101 11/17/2020 1103   CO2 29 11/17/2020 1103   GLUCOSE 98 11/17/2020 1103   BUN 17 11/17/2020 1103   BUN 16 12/22/2019 1057   CREATININE 1.08 11/17/2020 1103   CREATININE 1.12 03/20/2020 0928   CALCIUM 9.8 11/17/2020 1103   PROT 7.4  11/17/2020 1103   PROT 7.5 12/22/2019 1057   ALBUMIN 4.6 11/17/2020 1103   ALBUMIN 4.9 12/22/2019 1057   AST 22 11/17/2020 1103   ALT 32 11/17/2020 1103   ALKPHOS 65 11/17/2020 1103   BILITOT 0.4 11/17/2020 1103   BILITOT 0.3 12/22/2019 1057   GFRNONAA 84 12/22/2019 1057   GFRAA 97 12/22/2019 1057       Component Value Date/Time   WBC 7.7 01/25/2021 1526   RBC 4.88 01/25/2021 1526   HGB 15.1 01/25/2021 1526   HCT 44.3 01/25/2021 1526   PLT 215 01/25/2021 1526   MCV 90.8 01/25/2021 1526   MCH 30.9 01/25/2021 1526   MCHC 34.1  01/25/2021 1526   RDW 12.2 01/25/2021 1526   LYMPHSABS 2.2 04/12/2019 0845   MONOABS 0.4 04/12/2019 0845   EOSABS 0.1 04/12/2019 0845   BASOSABS 0.0 04/12/2019 0845    No results found for: POCLITH, LITHIUM   No results found for: PHENYTOIN, PHENOBARB, VALPROATE, CBMZ   .res Assessment: Plan:    Willow was seen today for follow-up, depression, anxiety and medication reaction.  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  He would like to continue the Spravato because of a history of benefit from IV ketamine.  He is also treated for anxiety disorders including OCD. Agree with plan to transition to Trintellix 20 mg daily for depression and trial for OCD.  He wanted to increase the dose today.  He understands this might failed to work as well for his OCD as did fluoxetine or and that anxiety in general might get worse.  He tolerated the Spravato well without unusual side effects.  He was observed from 2 to 4 PM and the dissociation that is expected resolved over the course of that time  frame.  Again he experienced an immediate lifting of mood as he has in past treatments.  He is pleased with his progress so for with Spravato.  Trintellix to 20 mg, 1 p.o. daily Continue Spravato 84 mg weekly.   Continue Lunesta 2 mg, 1 p.o. nightly as needed. Continue Ativan 0.5 mg, 1 p.o. twice daily as needed.  Follow-up next week  Meredith Staggers MD, DFAPA Please see After Visit Summary for patient specific instructions.  No future appointments.   No orders of the defined types were placed in this encounter.   -------------------------------

## 2021-01-23 ENCOUNTER — Other Ambulatory Visit: Payer: Self-pay

## 2021-01-23 ENCOUNTER — Ambulatory Visit: Payer: 59

## 2021-01-23 ENCOUNTER — Ambulatory Visit (INDEPENDENT_AMBULATORY_CARE_PROVIDER_SITE_OTHER): Payer: 59 | Admitting: Physician Assistant

## 2021-01-23 VITALS — BP 143/87 | HR 60

## 2021-01-23 VITALS — BP 135/78 | HR 72

## 2021-01-23 DIAGNOSIS — F339 Major depressive disorder, recurrent, unspecified: Secondary | ICD-10-CM

## 2021-01-23 DIAGNOSIS — F411 Generalized anxiety disorder: Secondary | ICD-10-CM

## 2021-01-23 DIAGNOSIS — F429 Obsessive-compulsive disorder, unspecified: Secondary | ICD-10-CM

## 2021-01-23 MED ORDER — SPRAVATO (84 MG DOSE) 28 MG/DEVICE NA SOPK: PACK | NASAL | 0 refills | Status: DC

## 2021-01-23 NOTE — Progress Notes (Signed)
NURSE VISIT:    Pt here for his Spravato treatment for treatment resistant depression. He will receive 84 mg (3 of the 28 mg) nasal sprays today. Pt taken to treatment room. Spravato is stored at the doctor's office per FDA regulations in the safe behind another locked door due to being a schedule CIII medicaton. Pt's medication is delivered by [facility] in [place]. After nasal sprays used they are disposed of per FDA regulations. His beginning vital signs taken at 2:10 PM 132/78, 67 pulse. Pt instructed to blow his nose and recline back 45 degrees. He began his first nasal spray, with 5 minutes between next 2 nasal sprays. Pt brings head phones and an eye mask and relaxes in recliner after receiving his treatment. Pt sleeping after receiving his 3rd dose of Spravato within 30 minutes of treatment. Pt prefers the lights out overhead while resting in treatment room. Pt 40 minute vital sign check at 2:50 PM, B/P 149/94, pulse 66. Pt then resting/sleeping till Rosey Bath arrives near end of treatment to discuss his care.  Discharge vital signs at 4:10 PM were 142/88, pulse 65. Pt is feeling good at discharge with no complaints. Pt observed a total of 120 minutes with all symptoms of dissociation and sleepiness resolved. No medications given during treatment. Nursing assessed for a total of 45 minutes.  Pt will be scheduled next Tuesday, August 16 th at 2 pm.    LOT [id number] EXP 2024 MAY

## 2021-01-25 ENCOUNTER — Ambulatory Visit: Payer: 59 | Admitting: Physician Assistant

## 2021-01-25 ENCOUNTER — Other Ambulatory Visit: Payer: Self-pay

## 2021-01-25 ENCOUNTER — Ambulatory Visit: Payer: 59 | Admitting: Psychiatry

## 2021-01-25 ENCOUNTER — Encounter: Payer: Self-pay | Admitting: Surgery

## 2021-01-25 ENCOUNTER — Ambulatory Visit: Payer: 59 | Admitting: Surgery

## 2021-01-25 VITALS — BP 133/85 | HR 72 | Ht 72.0 in | Wt 200.0 lb

## 2021-01-25 DIAGNOSIS — G5761 Lesion of plantar nerve, right lower limb: Secondary | ICD-10-CM | POA: Diagnosis not present

## 2021-01-25 DIAGNOSIS — M79671 Pain in right foot: Secondary | ICD-10-CM

## 2021-01-25 NOTE — Progress Notes (Signed)
Office Visit Note   Patient: Albert [name]           Date of Birth: March 26, 1976           MRN: [id number] Visit Date: 01/25/2021              Requested by: [name], PA-C [address] [place],  [place] [id number] PCP: [name], PA-C   Assessment & Plan: Visit Diagnoses:  1. Pain in right foot   2. Morton's neuroma of right foot (second and third)     Plan: In hopes of giving patient some relief of his pain that I believe is secondary to right foot second and third Morton's neuroma offered injection.  After patient consent dorsal area was prepped with Betadine and Marcaine/Depo-Medrol 1 1/2:1/2 injected between the second and third metatarsal heads.  Tolerated procedure without complication.  After sitting for few minutes patient reported to my assistant that he had very good relief with anesthetic in place.  Patient is still convinced that he may have gout.  Uric acid November 15, 2020 ordered by Dr. Lorin Mercy was within normal limits at 7.7.  At his request I did have blood work drawn today to check a CBC and arthritis panel.  I will have him follow-up with Dr. Lorin Mercy in 3 weeks for recheck of his foot and he can also review labs at that time.  I did discuss the possibility of needing a Morton's neuroma excision depending on his response to the injection.  Follow-Up Instructions: Return in about 3 weeks (around 02/15/2021) for with dr yates recheck right foot and review labs. .   Orders:  Orders Placed This Encounter  Procedures   CBC   Antinuclear Antib (ANA)   Rheumatoid Factor   Uric acid   Sed Rate (ESR)   No orders of the defined types were placed in this encounter.     Procedures: Foot Inj  Date/Time: 01/25/2021 2:46 PM Performed by: Lanae Crumbly, PA-C Authorized by: Lanae Crumbly, PA-C   Consent Given by:  Patient Indications:  Neuroma Condition: Morton's Neuroma   Location:  R foot Prep: patient was prepped and draped in usual sterile fashion    Needle Size:  25 G Approach:  Dorsal Medications:  1.5 mL bupivacaine 0.25 %; 0.5 mg methylPREDNISolone acetate 40 MG/ML Patient Tolerance:  Patient tolerated the procedure well with no immediate complications  Injection performed between 2nd and 3rd metatarsal heads   Clinical Data: No additional findings.   Subjective: Chief Complaint  Patient presents with   Right Foot - Follow-up    HPI 45 year old white male returns with complaints of right foot pain.  Last office visit I felt that he had a second and third Morton's neuroma.  Says that his foot pain had temporarily gotten better but then he started to do some running for exercise.  States that pain returned between the second and third metatarsal head area.  States that he is also had some discomfort in his great toe.  Last office visit patient continued to feel that he had gout.  Uric acid ordered previous by Dr. Lorin Mercy was within normal limits.  Pain between the second and third metatarsal heads when he is ambulating and with high-impact activity. Review of Systems No current cardiac pulmonary GI GU issues  Objective: Vital Signs: BP 133/85 (BP Location: Left Arm, Patient Position: Sitting, Cuff Size: Normal)   Pulse 72   Ht 6' (1.829 m)  Wt 200 lb (90.7 kg)   SpO2 96%   BMI 27.12 kg/m   Physical Exam HENT:     Head: Normocephalic and atraumatic.  Pulmonary:     Breath sounds: Normal breath sounds.  Musculoskeletal:     Comments: Right ankle unremarkable.  Right foot no swelling, redness, bruising noted.  Right first MTP joint nontender.  He is moderately tender between the second and third metatarsal heads.  Rest of his exam is unremarkable.  Neurological:     Mental Status: He is alert.    Ortho Exam  Specialty Comments:  No specialty comments available.  Imaging: No results found.   PMFS History: Patient Active Problem List   Diagnosis Date Noted   Recurrent major depression resistant to treatment  ([place]) 11/17/2020   Seasonal affective disorder ([place]) 11/17/2020   Generalized anxiety disorder 11/17/2020   Low testosterone 10/28/2020   Lumbar foraminal stenosis 12/16/2019   Essential hypertension 08/04/2018   OCD (obsessive compulsive disorder) 05/25/2018   Insomnia 05/25/2018   Cardiac risk counseling 07/20/2017   Closed nondisplaced fracture of neck of fifth metacarpal bone of right hand 07/10/2016   Attention deficit hyperactivity disorder (ADHD) 06/19/2016   Thrombosed external hemorrhoid 10/06/2015   CONTACT DERMATITIS&OTHER ECZEMA DUE UNSPEC CAUSE 10/06/2009   NECK PAIN, ACUTE 09/13/2009   TOBACCO USE, QUIT 03/16/2009   DEPRESSION 02/20/2009   Past Medical History:  Diagnosis Date   Attention deficit hyperactivity disorder (ADHD) 06/19/2016   Closed nondisplaced fracture of neck of fifth metacarpal bone of right hand 07/10/2016   CONTACT DERMATITIS&OTHER ECZEMA DUE UNSPEC CAUSE 10/06/2009   Qualifier: Diagnosis of  By: Ronnald Ramp MD, Arvid Right.    Depression    DEPRESSION 02/20/2009   Qualifier: Diagnosis of  By: Ronnald Ramp MD, Arvid Right.    Hyperlipidemia    NECK PAIN, ACUTE 09/13/2009   Qualifier: Diagnosis of  By: Ronnald Ramp MD, Arvid Right.    Thrombosed external hemorrhoid 10/06/2015   Symptoms and exam consistent with thrombosed external hemorrhoid. Patient declines incise and drain at this time. Treat conservatively with hydrocortisone rectal cream and Tylenol 3 as needed for discomfort. Recommend sitz bath. Follow-up if symptoms worsen or do not improve or would like to seek interventional procedure.    TOBACCO USE, QUIT 03/16/2009   Qualifier: Diagnosis of  By: Doralee Albino      Family History  Problem Relation Age of Onset   Healthy Mother    Hyperlipidemia Paternal Grandfather    Heart disease Paternal Grandfather    Heart failure Paternal Grandfather    Other Neg Hx        low testosterone    Past Surgical History:  Procedure Laterality Date   HERNIA REPAIR     Social  History   Occupational History   Occupation: Sales  Tobacco Use   Smoking status: Never   Smokeless tobacco: Never   Tobacco comments:    socially smoked years ago.  Vaping Use   Vaping Use: Never used  Substance and Sexual Activity   Alcohol use: Yes    Alcohol/week: 7.0 standard drinks    Types: 3 Glasses of wine, 4 Standard drinks or equivalent per week   Drug use: No   Sexual activity: Yes

## 2021-01-26 MED ORDER — METHYLPREDNISOLONE ACETATE 40 MG/ML IJ SUSP
0.5000 mg | INTRAMUSCULAR | Status: AC | PRN
  Administered 2021-01-25: .5 mg

## 2021-01-26 MED ORDER — BUPIVACAINE HCL 0.25 % IJ SOLN
1.5000 mL | INTRAMUSCULAR | Status: AC | PRN
  Administered 2021-01-25: 1.5 mL

## 2021-01-27 NOTE — Progress Notes (Signed)
[facility]  Patient ID: Albert [name],  MRN: [id number]  PCP: Esperanza Richters, PA-C  Date of Evaluation: 01/23/2021 time spent:40 minutes  Chief Complaint:  Chief Complaint   Other      HISTORY/CURRENT STATUS: HPI For Spravato   He continues to do well on weekly Spravato treatments. Depression is well-controlled. Able to enjoy things. Energy and motivation are good. Not isolating. Work is going well. Sleeps good. No SI/HI.  Now off Prozac for over a month. Trintellix seems to be working well without the sexual SE. OCD symptoms are well controlled.  He still has ruminating thoughts sometimes but not nearly like they were.  Anxiety is controlled.    Work is going well. States that attention is good without easy distractibility.  Able to focus on things and finish tasks to completion.   At the end of the 2-hour observation of Spravato, he states he's doing well.   Experienced dissociation, very relaxed for about an hour and now feels fine. Slightly groggy but overall fine. Denies headache, visual changes, dizziness, or n/v.   Review of Systems  Constitutional: Negative.   HENT: Negative.    Eyes: Negative.   Respiratory: Negative.    Cardiovascular: Negative.   Gastrointestinal: Negative.   Genitourinary: Negative.   Musculoskeletal: Negative.   Skin: Negative.   Neurological: Negative.   Endo/Heme/Allergies: Negative.   Psychiatric/Behavioral:         See HPI    Individual Medical History/ Review of Systems: Changes? :No   Past medications for mental health diagnoses include: Luvox, Lunesta, Xanax, Wellbutrin, Risperdal, Prozac, Ambien, Deplin, Abilify, naltrexone, Adderall, Spravato  Allergies: Sulfamethoxazole and Sulfonamide derivatives  Current Medications:  Current Outpatient Medications:    clomiPHENE (CLOMID) 50 MG tablet, TAKE 1/2 TABLET BY MOUTH EVERY DAY, Disp: 45 tablet, Rfl: 3   colchicine 0.6 MG tablet, Take one bid prn, Disp: 20 tablet,  Rfl: 0   Esketamine HCl, 84 MG Dose, (SPRAVATO, 84 MG DOSE,) 28 MG/DEVICE SOPK, Place 84 mg dose into nostrils once a week, Disp: 3 each, Rfl: 0   eszopiclone (LUNESTA) 2 MG TABS tablet, Take 1 tablet (2 mg total) by mouth at bedtime as needed for sleep. Take immediately before bedtime, Disp: 30 tablet, Rfl: 5   ezetimibe (ZETIA) 10 MG tablet, Take 1 tablet (10 mg total) by mouth daily., Disp: 90 tablet, Rfl: 3   LORazepam (ATIVAN) 0.5 MG tablet, Take 0.5-1 tablets (0.25-0.5 mg total) by mouth 2 (two) times daily as needed for anxiety., Disp: 30 tablet, Rfl: 1   telmisartan-hydrochlorothiazide (MICARDIS HCT) 80-12.5 MG tablet, Take 1 tablet by mouth daily., Disp: 90 tablet, Rfl: 1   vortioxetine HBr (TRINTELLIX) 20 MG TABS tablet, Take 1 tablet (20 mg total) by mouth daily. (Patient taking differently: Take 15 mg by mouth daily.), Disp: 30 tablet, Rfl: 1 Medication Side Effects: sexual dysfunction  Family Medical/ Social History: Changes? No  MENTAL HEALTH EXAM:  Before Spravato-BP 135/78, p72 40 minutes after administration of Spravato-BP 156/96 p65 2 hours after administration of Spravato-BP 143/87, p60   Blood pressure 135/78, pulse 72.There is no height or weight on file to calculate BMI.  General Appearance: Casual and Well Groomed.  Eye Contact:  Good  Speech:  Clear and Coherent and Normal Rate  Volume:  Normal  Mood:  Euthymic  Affect:  Appropriate  Thought Process:  Goal Directed and Descriptions of Associations: Circumstantial  Orientation:  Full (Time, Place, and Person)  Thought Content: Logical  Suicidal Thoughts:  No  Homicidal Thoughts:  No  Memory:  WNL  Judgement:  Good  Insight:  Good  Psychomotor Activity:  Normal  Concentration:  Concentration: Good and Attention Span: Good  Recall:  Good  Fund of Knowledge: Good  Language: Good  Assets:  Desire for Improvement  ADL's:  Intact  Cognition: WNL  Prognosis:  Good   See MADRS score  DIAGNOSES:     ICD-10-CM   1. 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 Obsessive-compulsive disorder, unspecified type  F42.9     3. Generalized anxiety disorder  F41.1       Receiving Psychotherapy: No    RECOMMENDATIONS:  PDMP was reviewed.   I provided 40 minutes of face to face time during this encounter, including time spent before and after the visit in records review, complex medical decision making, and charting.  I am glad he is still doing well with the Spravato.  And that Trintellix is helping mood and ocd without the sexual side effects. Continue Trintellix to 20 mg, 1 p.o. daily.  Continue Spravato 84 mg weekly.  Continue Lunesta 2 mg, 1 p.o. nightly as needed. Continue Ativan 0.5 mg, 1 p.o. twice daily as needed. Return in 1 week.  [name], PA-C

## 2021-01-28 ENCOUNTER — Encounter: Payer: Self-pay | Admitting: Physician Assistant

## 2021-01-28 NOTE — Progress Notes (Signed)
Nurse Visit:  Pt here for his weekly Spravato Treatments of 84 mg ( 3 of the 28 mg) nasal sprays for his treatment resistant depression. Pt taken to treatment room. No complaints voiced. Pt reports he started taking 20 mg of Trintellix today, increased after a week on the 15 mg dose. Feels it's working but hopes the 20 mg will be more effective, no side effects reported at this time. Pt's Spravato is delivered locally by [facility] and stored at [employer] office in a safe and behind another locked door, due to being a schedule CIII medication. Pt's initial vital signs assessed at 2:10 PM, 135/78, pulse 72. Pt asked to blow his nose and recline his chair to a 45 degree angle to begin his first treatment. Pt given 1 of the 28 mg dose nasal sprays, with 5 minutes between all 3 nasal sprays until completed. Pt brings head phones and an eye mask and asks the lights are turned down. Pt does fall asleep within 30 minutes. Pt's 40 minute vital signs assessed at 2:50 PM, 156/96, pulse 65. Pt resting/sleeping until end of his treatment, at that time [name], PA-C comes in to discuss his progress and treatment. Discharge vital signs assessed at 4:10 PM, 143/87, pulse 60. Pt is feeling good with no complaints. Pt observed a total of 120 minutes in the office per FDA/REMS requirements, his symptoms of dissociation and sleepiness are all resolved prior to discharge. No medications given during treatment for his symptoms. Nursing assessed pt a total of 45 minutes. Pt will be in next Tuesday, August 23 rd.  LOT [id number] EXP May 2024.

## 2021-01-29 LAB — CBC
HCT: 44.3 % (ref 38.5–50.0)
Hemoglobin: 15.1 g/dL (ref 13.2–17.1)
MCH: 30.9 pg (ref 27.0–33.0)
MCHC: 34.1 g/dL (ref 32.0–36.0)
MCV: 90.8 fL (ref 80.0–100.0)
MPV: 10.1 fL (ref 7.5–12.5)
Platelets: 215 10*3/uL (ref 140–400)
RBC: 4.88 10*6/uL (ref 4.20–5.80)
RDW: 12.2 % (ref 11.0–15.0)
WBC: 7.7 10*3/uL (ref 3.8–10.8)

## 2021-01-29 LAB — SEDIMENTATION RATE: Sed Rate: 6 mm/h (ref 0–15)

## 2021-01-29 LAB — RHEUMATOID FACTOR: Rheumatoid fact SerPl-aCnc: <14 IU/mL (ref <14)

## 2021-01-29 LAB — URIC ACID: Uric Acid, Serum: 8.3 mg/dL — ABNORMAL HIGH (ref 4.0–8.0)

## 2021-01-29 LAB — ANA: Anti Nuclear Antibody (ANA): NEGATIVE

## 2021-01-30 ENCOUNTER — Ambulatory Visit (INDEPENDENT_AMBULATORY_CARE_PROVIDER_SITE_OTHER): Payer: 59 | Admitting: Physician Assistant

## 2021-01-30 ENCOUNTER — Other Ambulatory Visit: Payer: Self-pay

## 2021-01-30 ENCOUNTER — Ambulatory Visit: Payer: 59

## 2021-01-30 VITALS — BP 143/86 | HR 62

## 2021-01-30 VITALS — BP 139/90 | HR 59

## 2021-01-30 DIAGNOSIS — F339 Major depressive disorder, recurrent, unspecified: Secondary | ICD-10-CM | POA: Diagnosis not present

## 2021-01-30 DIAGNOSIS — F411 Generalized anxiety disorder: Secondary | ICD-10-CM

## 2021-01-30 DIAGNOSIS — F429 Obsessive-compulsive disorder, unspecified: Secondary | ICD-10-CM

## 2021-01-30 MED ORDER — SPRAVATO (84 MG DOSE) 28 MG/DEVICE NA SOPK: PACK | NASAL | 0 refills | Status: DC

## 2021-02-01 ENCOUNTER — Encounter: Payer: Self-pay | Admitting: Orthopaedic Surgery

## 2021-02-01 MED ORDER — COLCHICINE 0.6 MG PO TABS: ORAL_TABLET | ORAL | 0 refills | Status: DC

## 2021-02-01 MED ORDER — PREDNISONE 10 MG (21) PO TBPK
ORAL_TABLET | ORAL | 0 refills | Status: DC
Start: 2021-02-01 — End: 2021-02-08

## 2021-02-02 ENCOUNTER — Telehealth: Payer: Self-pay | Admitting: Radiology

## 2021-02-02 MED ORDER — COLCHICINE 0.6 MG PO CAPS: 1.0000 | ORAL_CAPSULE | Freq: Two times a day (BID) | ORAL | 0 refills | Status: DC | PRN

## 2021-02-02 NOTE — Telephone Encounter (Signed)
Received fax from pharmacy stating insurance requires mitigare (colchicine) capsules instead of tablets. New rx entered and sent to pharmacy.

## 2021-02-03 ENCOUNTER — Encounter: Payer: Self-pay | Admitting: Physician Assistant

## 2021-02-04 NOTE — Progress Notes (Signed)
Nurse Visit:  Pt here for his weekly Spravato Treatments of 84 mg ( 3 of the 28 mg) nasal sprays for his treatment resistant depression. Pt taken to treatment room. No complaints voiced. Pt reports he started taking 20 mg of Trintellix today, increased after a week on the 15 mg dose. Feels it's working but hopes the 20 mg will be more effective, no side effects reported at this time. Pt's Spravato is delivered locally by [facility] and stored at [employer] office in a safe and behind another locked door, due to being a schedule CIII medication. Pt's initial vital signs assessed at 2:20 PM, 143/86, pulse 62. Pt asked to blow his nose and recline his chair to a 45 degree angle to begin his first treatment. Pt given 1 of the 28 mg dose nasal sprays, with 5 minutes between all 3 nasal sprays until completed. Pt brings head phones and an eye mask and asks the lights are turned down. Pt does fall asleep within 30 minutes. Pt's 40 minute vital signs assessed at 3:00 PM, 154/97, pulse 64. Pt resting/sleeping until end of his treatment, at that time [name], PA-C comes in to discuss his progress and treatment. Discharge vital signs assessed at 4:20 PM, 139/90, pulse 59. Pt is feeling good with no complaints. Pt observed a total of 120 minutes in the office per FDA/REMS requirements, his symptoms of dissociation and sleepiness are all resolved prior to discharge. No medications given during treatment for his symptoms. Nursing assessed pt a total of 45 minutes. Pt will be in next Tuesday, August 30th.   LOT [id number] EXP May 2024.

## 2021-02-04 NOTE — Progress Notes (Signed)
[facility]  Patient ID: Albert [name],  MRN: [id number]  PCP: Esperanza Richters, PA-C  Date of Evaluation: 01/30/2021 time spent:40 minutes  Chief Complaint:  Chief Complaint   Depression; Other      HISTORY/CURRENT STATUS: HPI For Spravato   He continues to do well on weekly Spravato treatments. Depression is well-controlled. Able to enjoy things. Energy and motivation are good. Not isolating. Sleeps good. No SI/HI.    Trintellix seems to be working well without the sexual SE. Dose is increasing to 20 mg today. OCD symptoms are well controlled for the most part.  He still has ruminating thoughts sometimes but not nearly like they were.  Anxiety is controlled.    Work is going well. States that attention is good most of the time, without easy distractibility.  Able to focus on things and finish tasks to completion.   Albert [name] is doing well during and at the end of the 2-hour observation period after Spravato.  He experienced dissociation, nothing frightening.  Very relaxed for about an hour and now feels fine. Slightly groggy but overall fine. Denies headache, visual changes, dizziness, or n/v.  Review of Systems  Constitutional: Negative.   HENT: Negative.    Eyes: Negative.   Respiratory: Negative.    Cardiovascular: Negative.   Gastrointestinal: Negative.   Genitourinary: Negative.   Musculoskeletal: Negative.   Skin: Negative.   Neurological: Negative.   Endo/Heme/Allergies: Negative.   Psychiatric/Behavioral:         See HPI    Individual Medical History/ Review of Systems: Changes? :No   Past medications for mental health diagnoses include: Luvox, Lunesta, Xanax, Wellbutrin, Risperdal, Prozac, Ambien, Deplin, Abilify, naltrexone, Adderall, Spravato  Allergies: Sulfamethoxazole and Sulfonamide derivatives  Current Medications:  Current Outpatient Medications:    clomiPHENE (CLOMID) 50 MG tablet, TAKE 1/2 TABLET BY MOUTH EVERY DAY, Disp: 45 tablet, Rfl:  3   Colchicine (MITIGARE) 0.6 MG CAPS, Take 1 capsule by mouth 2 (two) times daily as needed., Disp: 40 capsule, Rfl: 0   colchicine 0.6 MG tablet, Take one bid prn, Disp: 40 tablet, Rfl: 0   Esketamine HCl, 84 MG Dose, (SPRAVATO, 84 MG DOSE,) 28 MG/DEVICE SOPK, Place 84 mg dose into nostrils once a week, Disp: 3 each, Rfl: 0   eszopiclone (LUNESTA) 2 MG TABS tablet, Take 1 tablet (2 mg total) by mouth at bedtime as needed for sleep. Take immediately before bedtime, Disp: 30 tablet, Rfl: 5   ezetimibe (ZETIA) 10 MG tablet, Take 1 tablet (10 mg total) by mouth daily., Disp: 90 tablet, Rfl: 3   LORazepam (ATIVAN) 0.5 MG tablet, Take 0.5-1 tablets (0.25-0.5 mg total) by mouth 2 (two) times daily as needed for anxiety., Disp: 30 tablet, Rfl: 1   predniSONE (STERAPRED UNI-PAK 21 TAB) 10 MG (21) TBPK tablet, Take as directed., Disp: 21 tablet, Rfl: 0   telmisartan-hydrochlorothiazide (MICARDIS HCT) 80-12.5 MG tablet, Take 1 tablet by mouth daily., Disp: 90 tablet, Rfl: 1   vortioxetine HBr (TRINTELLIX) 20 MG TABS tablet, Take 1 tablet (20 mg total) by mouth daily., Disp: 30 tablet, Rfl: 1 Medication Side Effects: sexual dysfunction  Family Medical/ Social History: Changes? No  MENTAL HEALTH EXAM:  Before Spravato-143/86, pulse 62 40 minutes after administration of Spravato-154/97, pulse 64 2 hours after administration of Spravato-BP 139/90, pulse 59   Blood pressure (!) 143/86, pulse 62.There is no height or weight on file to calculate BMI.  General Appearance: Casual, Well Groomed, and slightly groggy. Marland Kitchen  Eye Contact:  Good  Speech:  Clear and Coherent and Normal Rate  Volume:  Normal  Mood:  Euthymic  Affect:  Appropriate  Thought Process:  Goal Directed and Descriptions of Associations: Intact  Orientation:  Full (Time, Place, and Person)  Thought Content: Logical   Suicidal Thoughts:  No  Homicidal Thoughts:  No  Memory:  WNL  Judgement:  Good  Insight:  Good  Psychomotor Activity:   Normal  Concentration:  Concentration: Good and Attention Span: Good  Recall:  Good  Fund of Knowledge: Good  Language: Good  Assets:  Desire for Improvement  ADL's:  Intact  Cognition: WNL  Prognosis:  Good    DIAGNOSES:    ICD-10-CM   1. 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 Obsessive-compulsive disorder, unspecified type  F42.9     3. Generalized anxiety disorder  F41.1        Receiving Psychotherapy: No    RECOMMENDATIONS:  PDMP was reviewed.   I provided 40 minutes of face to face time during this encounter, including time spent before and after the visit in records review, complex medical decision making, and charting.  I am glad he is still responding well to the Spravato.  The Trintellix is working well for the depression and OCD without sexual side effects so no change in treatment is needed.   Continue Trintellix to 20 mg, 1 p.o. daily.  Continue Spravato 84 mg weekly.  Continue Lunesta 2 mg, 1 p.o. nightly as needed. Continue Ativan 0.5 mg, 1 p.o. twice daily as needed. Return in 1 week.  [name], PA-C

## 2021-02-05 ENCOUNTER — Ambulatory Visit: Payer: 59 | Admitting: Physician Assistant

## 2021-02-06 ENCOUNTER — Ambulatory Visit: Payer: 59 | Admitting: Physician Assistant

## 2021-02-07 ENCOUNTER — Encounter: Payer: Self-pay | Admitting: Physician Assistant

## 2021-02-07 ENCOUNTER — Ambulatory Visit: Payer: 59 | Admitting: Orthopaedic Surgery

## 2021-02-08 ENCOUNTER — Other Ambulatory Visit: Payer: Self-pay

## 2021-02-08 ENCOUNTER — Ambulatory Visit: Payer: 59 | Admitting: Endocrinology

## 2021-02-08 VITALS — BP 138/86 | HR 74 | Ht 72.0 in | Wt 199.7 lb

## 2021-02-08 DIAGNOSIS — R7989 Other specified abnormal findings of blood chemistry: Secondary | ICD-10-CM

## 2021-02-08 LAB — T4, FREE: Free T4: 0.6 ng/dL (ref 0.60–1.60)

## 2021-02-08 LAB — TSH: TSH: 2.51 u[IU]/mL (ref 0.35–5.50)

## 2021-02-08 NOTE — Progress Notes (Signed)
Subjective:    Patient ID: Albert [name], male    DOB: 09/12/1975, 45 y.o.   MRN: [id number]  HPI Pt returns for f/u of idiopathic central low testosterone (dx'ed 2014; he has 1 biological child; he has never had pituitary imaging; pt says he consumes alcohol more then he feels he should.  He first took topical testosterone, then injections x a few months, but this was changed to clomid to allow for fertility.  He reports sxs of fatigue and decreased libido.  He now wants to change to topical testosterone.  He stopped clomid 2 weeks ago.   Past Medical History:  Diagnosis Date   Attention deficit hyperactivity disorder (ADHD) 06/19/2016   Closed nondisplaced fracture of neck of fifth metacarpal bone of right hand 07/10/2016   CONTACT DERMATITIS&OTHER ECZEMA DUE UNSPEC CAUSE 10/06/2009   Qualifier: Diagnosis of  By: Yetta Barre MD, Bernadene Bell.    Depression    DEPRESSION 02/20/2009   Qualifier: Diagnosis of  By: Yetta Barre MD, Bernadene Bell.    Hyperlipidemia    NECK PAIN, ACUTE 09/13/2009   Qualifier: Diagnosis of  By: Yetta Barre MD, Bernadene Bell.    Thrombosed external hemorrhoid 10/06/2015   Symptoms and exam consistent with thrombosed external hemorrhoid. Patient declines incise and drain at this time. Treat conservatively with hydrocortisone rectal cream and Tylenol 3 as needed for discomfort. Recommend sitz bath. Follow-up if symptoms worsen or do not improve or would like to seek interventional procedure.    TOBACCO USE, QUIT 03/16/2009   Qualifier: Diagnosis of  By: Tora Perches      Past Surgical History:  Procedure Laterality Date   HERNIA REPAIR      Social History   Socioeconomic History   Marital status: Married    Spouse name: Not on file   Number of children: 1   Years of education: 16   Highest education level: Not on file  Occupational History   Occupation: Sales  Tobacco Use   Smoking status: Never   Smokeless tobacco: Never   Tobacco comments:    socially smoked years ago.  Vaping Use    Vaping Use: Never used  Substance and Sexual Activity   Alcohol use: Yes    Alcohol/week: 7.0 standard drinks    Types: 3 Glasses of wine, 4 Standard drinks or equivalent per week   Drug use: No   Sexual activity: Yes  Other Topics Concern   Not on file  Social History Narrative   Fun: Exercise, play music, sports   Social Determinants of Health   Financial Resource Strain: Not on file  Food Insecurity: Not on file  Transportation Needs: Not on file  Physical Activity: Not on file  Stress: Not on file  Social Connections: Not on file  Intimate Partner Violence: Not on file    Current Outpatient Medications on File Prior to Visit  Medication Sig Dispense Refill   Colchicine (MITIGARE) 0.6 MG CAPS Take 1 capsule by mouth 2 (two) times daily as needed. 40 capsule 0   telmisartan-hydrochlorothiazide (MICARDIS HCT) 80-12.5 MG tablet Take 1 tablet by mouth daily. 90 tablet 1   vortioxetine HBr (TRINTELLIX) 20 MG TABS tablet Take 1 tablet (20 mg total) by mouth daily. 30 tablet 1   ezetimibe (ZETIA) 10 MG tablet Take 1 tablet (10 mg total) by mouth daily. 90 tablet 3   No current facility-administered medications on file prior to visit.    Allergies  Allergen Reactions   Sulfamethoxazole  Other reaction(s): Arthralgias (intolerance)   Sulfonamide Derivatives     REACTION: Rash Stiff joints    Family History  Problem Relation Age of Onset   Healthy Mother    Hyperlipidemia Paternal Grandfather    Heart disease Paternal Grandfather    Heart failure Paternal Grandfather    Other Neg Hx        low testosterone    BP 138/86 (BP Location: Right Arm, Patient Position: Sitting, Cuff Size: Large)   Pulse 74   Ht 6' (1.829 m)   Wt 199 lb 11.2 oz (90.6 kg)   SpO2 98%   BMI 27.08 kg/m    Review of Systems     Objective:   Physical Exam Ext: no leg edema.     Lab Results  Component Value Date   WBC 7.7 01/25/2021   HGB 15.1 01/25/2021   HCT 44.3 01/25/2021    MCV 90.8 01/25/2021   PLT 215 01/25/2021      Assessment & Plan:  Low testosterone, well-controlled.  Stay off clomid, and recheck labs in 1 more month.

## 2021-02-08 NOTE — Patient Instructions (Signed)
Blood tests are requested for you today.  We'll let you know about the results.   If the testosterone is not low now, we'll recheck the blood in 2-4 weeks.

## 2021-02-09 ENCOUNTER — Other Ambulatory Visit: Payer: Self-pay

## 2021-02-09 ENCOUNTER — Encounter: Payer: Self-pay | Admitting: Endocrinology

## 2021-02-09 LAB — PROLACTIN: Prolactin: 6.2 ng/mL (ref 2.0–18.0)

## 2021-02-13 ENCOUNTER — Other Ambulatory Visit: Payer: Self-pay

## 2021-02-13 ENCOUNTER — Ambulatory Visit (INDEPENDENT_AMBULATORY_CARE_PROVIDER_SITE_OTHER): Payer: 59 | Admitting: Physician Assistant

## 2021-02-13 ENCOUNTER — Ambulatory Visit: Payer: 59

## 2021-02-13 ENCOUNTER — Telehealth: Payer: Self-pay

## 2021-02-13 VITALS — BP 144/80 | HR 69

## 2021-02-13 VITALS — BP 136/68 | HR 81

## 2021-02-13 DIAGNOSIS — F339 Major depressive disorder, recurrent, unspecified: Secondary | ICD-10-CM | POA: Diagnosis not present

## 2021-02-13 DIAGNOSIS — F338 Other recurrent depressive disorders: Secondary | ICD-10-CM | POA: Diagnosis not present

## 2021-02-13 DIAGNOSIS — F429 Obsessive-compulsive disorder, unspecified: Secondary | ICD-10-CM | POA: Diagnosis not present

## 2021-02-13 DIAGNOSIS — F411 Generalized anxiety disorder: Secondary | ICD-10-CM | POA: Diagnosis not present

## 2021-02-13 MED ORDER — SPRAVATO (84 MG DOSE) 28 MG/DEVICE NA SOPK: 84.0000 mg | PACK | NASAL | 0 refills | Status: DC

## 2021-02-13 MED ORDER — VORTIOXETINE HBR 20 MG PO TABS: 20.0000 mg | ORAL_TABLET | Freq: Every day | ORAL | 5 refills | Status: DC

## 2021-02-13 NOTE — Telephone Encounter (Signed)
Pt requesting Lab results and what to do next.  Please Advise

## 2021-02-13 NOTE — Progress Notes (Signed)
[facility]  Patient ID: Albert [name],  MRN: [id number]  PCP: Albert Richters, PA-C  Date of Evaluation: 02/13/2021 time spent:40 minutes  Chief Complaint:  Chief Complaint   Depression; Other     HISTORY/CURRENT STATUS: HPI For Spravato   Did not have Spravato last week due to being out of town.  He did feel more depressed, stating he could definitely feel that he had not had the treatment.  But the depression was not to the point that he was unable to function.  He still worked without difficulty.  Has been able to enjoy things.  Energy and motivation are good.  Personal hygiene is normal.  Sleeps good.  No suicidal or homicidal thoughts.  Trintellix is working well for the depression without the side effects.. OCD symptoms are well controlled for the most part.  He still has ruminating thoughts sometimes but not nearly like they were.  Anxiety is controlled.    Work is going well. States that attention is good most of the time, without easy distractibility.  Able to focus on things and finish tasks to completion.   Albert [name] is doing well during and at the end of the 2-hour observation period after Spravato.  He experienced dissociation, nothing frightening.  Very relaxed for about an hour and now feels fine. Slightly groggy but overall fine. Denies headache, visual changes, dizziness, or n/v.  Review of Systems  Constitutional: Negative.   HENT: Negative.    Eyes: Negative.   Respiratory: Negative.    Cardiovascular: Negative.   Gastrointestinal: Negative.   Genitourinary: Negative.   Musculoskeletal: Negative.   Skin: Negative.   Neurological: Negative.   Endo/Heme/Allergies: Negative.   Psychiatric/Behavioral:         See HPI    Individual Medical History/ Review of Systems: Changes? :No   Past medications for mental health diagnoses include: Luvox, Lunesta, Xanax, Wellbutrin, Risperdal, Prozac, Ambien, Deplin, Abilify, naltrexone, Adderall,  Spravato  Allergies: Sulfamethoxazole and Sulfonamide derivatives  Current Medications:  Current Outpatient Medications:    Esketamine HCl, 84 MG Dose, (SPRAVATO, 84 MG DOSE,) 28 MG/DEVICE SOPK, Place 84 mg into the nose once a week., Disp: 3 each, Rfl: 0   telmisartan-hydrochlorothiazide (MICARDIS HCT) 80-12.5 MG tablet, Take 1 tablet by mouth daily., Disp: 90 tablet, Rfl: 1   Colchicine (MITIGARE) 0.6 MG CAPS, Take 1 capsule by mouth 2 (two) times daily as needed. (Patient not taking: Reported on 02/13/2021), Disp: 40 capsule, Rfl: 0   ezetimibe (ZETIA) 10 MG tablet, Take 1 tablet (10 mg total) by mouth daily., Disp: 90 tablet, Rfl: 3   vortioxetine HBr (TRINTELLIX) 20 MG TABS tablet, Take 1 tablet (20 mg total) by mouth daily., Disp: 30 tablet, Rfl: 5 Medication Side Effects: sexual dysfunction  Family Medical/ Social History: Changes? No  MENTAL HEALTH EXAM:  Before Spravato-136/68, pulse 81  40 minutes after administration of Spravato-150/80 pulse 76  2 hours after administration of Spravato-BP 144/80 pulse 69  Blood pressure 136/68, pulse 81.There is no height or weight on file to calculate BMI.  General Appearance: Casual, Well Groomed, and slightly groggy. .  Eye Contact:  Good  Speech:  Clear and Coherent and Normal Rate  Volume:  Normal  Mood:  Euthymic  Affect:  Congruent  Thought Process:  Goal Directed and Descriptions of Associations: Intact  Orientation:  Full (Time, Place, and Person)  Thought Content: Logical   Suicidal Thoughts:  No  Homicidal Thoughts:  No  Memory:  WNL  Judgement:  Good  Insight:  Good  Psychomotor Activity:  Normal  Concentration:  Concentration: Good and Attention Span: Good  Recall:  Good  Fund of Knowledge: Good  Language: Good  Assets:  Desire for Improvement  ADL's:  Intact  Cognition: WNL  Prognosis:  Good    DIAGNOSES:    ICD-10-CM   1. 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  Obsessive-compulsive disorder, unspecified type  F42.9     3. Generalized anxiety disorder  F41.1     4. Seasonal affective disorder (HCC)  F33.8       Receiving Psychotherapy: No    RECOMMENDATIONS:  PDMP was reviewed.  Spravato noted. I provided 40 minutes of face to face time during this encounter, including time spent before and after the visit in records review, complex medical decision making, counseling pertinent to today's visit, and charting.  He continues to tolerate the Spravato and is responding so no changes will be made. Continue Trintellix to 20 mg, 1 p.o. daily.  Continue Spravato 84 mg weekly.  Continue Ativan 0.5 mg, 1 p.o. twice daily as needed. Return in 1 week.  [name], PA-C

## 2021-02-14 ENCOUNTER — Encounter: Payer: Self-pay | Admitting: Endocrinology

## 2021-02-14 ENCOUNTER — Other Ambulatory Visit: Payer: Self-pay | Admitting: Endocrinology

## 2021-02-14 DIAGNOSIS — R7989 Other specified abnormal findings of blood chemistry: Secondary | ICD-10-CM

## 2021-02-14 LAB — TESTOSTERONE,FREE AND TOTAL
Testosterone, Free: 6.1 pg/mL — ABNORMAL LOW (ref 6.8–21.5)
Testosterone: 326 ng/dL (ref 264–916)

## 2021-02-14 NOTE — Progress Notes (Signed)
Nurse Visit:   Pt here for his weekly Spravato Treatments of 84 mg ( 3 of the 28 mg) nasal sprays for his treatment resistant depression. Pt called last week and was unable to make his treatment, but he also reported he was feeling good. Pt taken to treatment room. No complaints voiced. Pt's Spravato is delivered locally by [facility] and stored at [employer] office in a safe and behind another locked door, due to being a schedule CIII medication. Pt's initial vital signs assessed at 2:10 PM, 136/68, pulse 81. Pt asked to blow his nose and recline his chair to a 45 degree angle to begin his first treatment. Pt given 1 of the 28 mg dose nasal sprays, with 5 minutes between all 3 nasal sprays until completed. Pt brings head phones and an eye mask and asks the lights are turned down. Pt does fall asleep within 30 minutes. Pt's 40 minute vital signs assessed at 2:50 PM, 150/80, pulse 76. Pt resting/sleeping until end of his treatment, at that time [name], PA-C comes in to discuss his progress and treatment. Discharge vital signs assessed at 4:10 PM, 144/80, pulse 69. Pt is feeling good with no complaints. Pt observed a total of 120 minutes in the office per FDA/REMS requirements, his symptoms of dissociation and sleepiness are all resolved prior to discharge. No medications given during treatment for his symptoms. Nursing assessed pt a total of 45 minutes. Pt will be in next Tuesday, September 13th.   LOT [id number] EXP May 2024.

## 2021-02-15 ENCOUNTER — Telehealth: Payer: Self-pay | Admitting: Orthopaedic Surgery

## 2021-02-15 NOTE — Telephone Encounter (Signed)
Received call from patient, needs records faxed to [facility] for an appt this afternoon, he is coming in to sign release form.

## 2021-02-15 NOTE — Telephone Encounter (Signed)
Pt requests Testosterone levels and what to do next. Please call pt [phone]

## 2021-02-17 ENCOUNTER — Encounter: Payer: Self-pay | Admitting: Physician Assistant

## 2021-02-20 ENCOUNTER — Ambulatory Visit: Payer: 59

## 2021-02-20 ENCOUNTER — Other Ambulatory Visit: Payer: Self-pay

## 2021-02-20 ENCOUNTER — Ambulatory Visit (INDEPENDENT_AMBULATORY_CARE_PROVIDER_SITE_OTHER): Payer: 59 | Admitting: Physician Assistant

## 2021-02-20 VITALS — BP 138/82 | HR 65

## 2021-02-20 VITALS — BP 149/87 | HR 61

## 2021-02-20 DIAGNOSIS — F411 Generalized anxiety disorder: Secondary | ICD-10-CM

## 2021-02-20 DIAGNOSIS — F339 Major depressive disorder, recurrent, unspecified: Secondary | ICD-10-CM | POA: Diagnosis not present

## 2021-02-20 DIAGNOSIS — F429 Obsessive-compulsive disorder, unspecified: Secondary | ICD-10-CM | POA: Diagnosis not present

## 2021-02-20 DIAGNOSIS — F19981 Other psychoactive substance use, unspecified with psychoactive substance-induced sexual dysfunction: Secondary | ICD-10-CM

## 2021-02-20 DIAGNOSIS — F338 Other recurrent depressive disorders: Secondary | ICD-10-CM

## 2021-02-20 DIAGNOSIS — F902 Attention-deficit hyperactivity disorder, combined type: Secondary | ICD-10-CM

## 2021-02-21 NOTE — Progress Notes (Signed)
Nurse Visit:   Pt here for his weekly Spravato Treatments of 84 mg ( 3 of the 28 mg) nasal sprays for his treatment resistant depression. Pt taken to treatment room. No complaints voiced. Pt's Spravato is delivered locally by [facility] and stored at [employer] office in a safe and behind another locked door, due to being a schedule CIII medication. Pt's initial vital signs assessed at 2:15 PM, 138/82, pulse 65. Pt asked to blow his nose and recline his chair to a 45 degree angle to begin his first treatment. Pt given 1 of the 28 mg dose nasal sprays, with 5 minutes between all 3 nasal sprays until completed. Pt brings head phones and an eye mask and asks the lights are turned down. Pt does fall asleep within 30 minutes. Pt's 40 minute vital signs assessed at 2:55 PM, 131/79, pulse 61. Pt resting/sleeping until end of his treatment, at that time [name], PA-C comes in to discuss his progress and treatment. Discharge vital signs assessed at 4:15 PM, 149/87, pulse 61. Pt is feeling good with no complaints. Pt observed a total of 120 minutes in the office per FDA/REMS requirements, his symptoms of dissociation and sleepiness are all resolved prior to discharge. No medications given during treatment for his symptoms. Nursing assessed pt a total of 45 minutes. Pt will be in next Tuesday, September 20th.   LOT [id number] EXP JUL 2024

## 2021-02-22 ENCOUNTER — Other Ambulatory Visit: Payer: Self-pay

## 2021-02-22 ENCOUNTER — Ambulatory Visit: Payer: 59 | Admitting: Physician Assistant

## 2021-02-22 ENCOUNTER — Other Ambulatory Visit (INDEPENDENT_AMBULATORY_CARE_PROVIDER_SITE_OTHER): Payer: 59

## 2021-02-22 DIAGNOSIS — R7989 Other specified abnormal findings of blood chemistry: Secondary | ICD-10-CM

## 2021-02-23 ENCOUNTER — Other Ambulatory Visit: Payer: Self-pay

## 2021-02-23 MED ORDER — SPRAVATO (84 MG DOSE) 28 MG/DEVICE NA SOPK: 84.0000 mg | PACK | NASAL | 0 refills | Status: DC

## 2021-02-25 ENCOUNTER — Encounter: Payer: Self-pay | Admitting: Physician Assistant

## 2021-02-25 NOTE — Progress Notes (Signed)
[facility]  Patient ID: Albert [name],  MRN: [id number]  PCP: Esperanza Richters, PA-C  Date of Evaluation: 02/20/2021 time spent:40 minutes  Chief Complaint:  Chief Complaint   Depression; Other     HISTORY/CURRENT STATUS: HPI For Spravato treatment.   He continues to do really well with the Spravato.  It is making a huge difference in the depression he was experiencing.  He is able to enjoy things.  Denies decreased energy or motivation.  Appetite has not changed.  No extreme sadness, tearfulness, or feelings of hopelessness. Denies suicidal or homicidal thoughts.  Trintellix is working well for the depression without the sexual side effects. OCD symptoms are not as controlled as they were on an SSRI but the ruminating thoughts are tolerable.  They are not affecting his work or home life.  Work is going well. States that attention is good most of the time, without easy distractibility.  Able to focus on things and finish tasks to completion.   Nikitas is doing well during and at the end of the 2-hour observation period after Spravato.  He did experience dissociation but after an hour or so, that SE resolved. Slightly groggy but overall fine. Denies headache, visual changes, dizziness, or n/v.  Review of Systems  Constitutional: Negative.   HENT: Negative.    Eyes: Negative.   Respiratory: Negative.    Cardiovascular: Negative.   Gastrointestinal: Negative.   Genitourinary: Negative.   Musculoskeletal: Negative.   Skin: Negative.   Neurological: Negative.   Endo/Heme/Allergies: Negative.   Psychiatric/Behavioral:         See HPI    Individual Medical History/ Review of Systems: Changes? :No   Past medications for mental health diagnoses include: Luvox, Lunesta, Xanax, Wellbutrin, Risperdal, Prozac, Ambien, Deplin, Abilify, naltrexone, Adderall, Spravato  Allergies: Sulfamethoxazole and Sulfonamide derivatives  Current Medications:  Current Outpatient  Medications:    Colchicine (MITIGARE) 0.6 MG CAPS, Take 1 capsule by mouth 2 (two) times daily as needed. (Patient not taking: Reported on 02/13/2021), Disp: 40 capsule, Rfl: 0   Esketamine HCl, 84 MG Dose, (SPRAVATO, 84 MG DOSE,) 28 MG/DEVICE SOPK, Place 84 mg into the nose once a week., Disp: 3 each, Rfl: 0   ezetimibe (ZETIA) 10 MG tablet, Take 1 tablet (10 mg total) by mouth daily., Disp: 90 tablet, Rfl: 3   telmisartan-hydrochlorothiazide (MICARDIS HCT) 80-12.5 MG tablet, Take 1 tablet by mouth daily., Disp: 90 tablet, Rfl: 1   vortioxetine HBr (TRINTELLIX) 20 MG TABS tablet, Take 1 tablet (20 mg total) by mouth daily., Disp: 30 tablet, Rfl: 5 Medication Side Effects: sexual dysfunction  Family Medical/ Social History: Changes? No  MENTAL HEALTH EXAM:  Before Spravato-BP 138/82, pulse 65 40 minutes after administration of Spravato-131/79, pulse 61 2 hours after administration of Spravato-BP 149/87, pulse 61  Blood pressure 138/82, pulse 65.There is no height or weight on file to calculate BMI.  General Appearance: Casual, Well Groomed, and slightly groggy. .  Eye Contact:  Good  Speech:  Clear and Coherent and Normal Rate  Volume:  Normal  Mood:  Euthymic  Affect:  Congruent  Thought Process:  Goal Directed and Descriptions of Associations: Intact  Orientation:  Full (Time, Place, and Person)  Thought Content: Logical   Suicidal Thoughts:  No  Homicidal Thoughts:  No  Memory:  WNL  Judgement:  Good  Insight:  Good  Psychomotor Activity:  Normal  Concentration:  Concentration: Good and Attention Span: Good  Recall:  Good  Fund  of Knowledge: Good  Language: Good  Assets:  Desire for Improvement  ADL's:  Intact  Cognition: WNL  Prognosis:  Good    DIAGNOSES:    ICD-10-CM   1. 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 Obsessive-compulsive disorder, unspecified type  F42.9     3. Generalized anxiety disorder  F41.1     4. Seasonal affective  disorder (HCC)  F33.8     5. 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 Attention deficit hyperactivity disorder (ADHD), combined type  F90.2        Receiving Psychotherapy: No    RECOMMENDATIONS:  PDMP was reviewed.  Spravato noted. I provided 40 minutes of face to face time during this encounter, including time spent before and after the visit in records review, complex medical decision making, counseling pertinent to today's visit, and charting.  He continues to tolerate the Spravato and is responding so no changes will be made. Continue Trintellix to 20 mg, 1 p.o. daily.  Continue Spravato 84 mg weekly.  Continue Ativan 0.5 mg, 1 p.o. twice daily as needed.  He has an old prescription and does not take it often. Return in 1 week.  [name], PA-C

## 2021-02-26 ENCOUNTER — Other Ambulatory Visit: Payer: Self-pay | Admitting: Endocrinology

## 2021-02-26 ENCOUNTER — Encounter: Payer: Self-pay | Admitting: Endocrinology

## 2021-02-26 DIAGNOSIS — R7989 Other specified abnormal findings of blood chemistry: Secondary | ICD-10-CM

## 2021-02-26 LAB — TESTOSTERONE,FREE AND TOTAL
Testosterone, Free: 6.3 pg/mL — ABNORMAL LOW (ref 6.8–21.5)
Testosterone: 198 ng/dL — ABNORMAL LOW (ref 264–916)

## 2021-02-26 MED ORDER — TESTOSTERONE 2 MG/24HR TD PT24: 1.0000 | MEDICATED_PATCH | Freq: Every day | TRANSDERMAL | 1 refills | Status: DC

## 2021-02-27 ENCOUNTER — Ambulatory Visit: Payer: 59 | Admitting: Physician Assistant

## 2021-02-27 ENCOUNTER — Other Ambulatory Visit: Payer: Self-pay | Admitting: Endocrinology

## 2021-02-27 DIAGNOSIS — R7989 Other specified abnormal findings of blood chemistry: Secondary | ICD-10-CM

## 2021-02-27 MED ORDER — TESTOSTERONE CYPIONATE 100 MG/ML IM SOLN: 30.0000 mg | INTRAMUSCULAR | 0 refills | Status: DC

## 2021-03-04 ENCOUNTER — Encounter: Payer: Self-pay | Admitting: Psychiatry

## 2021-03-06 ENCOUNTER — Other Ambulatory Visit: Payer: Self-pay

## 2021-03-06 MED ORDER — SPRAVATO (84 MG DOSE) 28 MG/DEVICE NA SOPK: 84.0000 mg | PACK | NASAL | 2 refills | Status: DC

## 2021-03-07 ENCOUNTER — Ambulatory Visit (INDEPENDENT_AMBULATORY_CARE_PROVIDER_SITE_OTHER): Payer: 59 | Admitting: Physician Assistant

## 2021-03-07 ENCOUNTER — Other Ambulatory Visit: Payer: Self-pay

## 2021-03-07 ENCOUNTER — Ambulatory Visit: Payer: 59

## 2021-03-07 ENCOUNTER — Encounter: Payer: Self-pay | Admitting: Physician Assistant

## 2021-03-07 VITALS — BP 144/90 | HR 60

## 2021-03-07 VITALS — BP 139/78 | HR 75

## 2021-03-07 DIAGNOSIS — F411 Generalized anxiety disorder: Secondary | ICD-10-CM

## 2021-03-07 DIAGNOSIS — F338 Other recurrent depressive disorders: Secondary | ICD-10-CM

## 2021-03-07 DIAGNOSIS — F429 Obsessive-compulsive disorder, unspecified: Secondary | ICD-10-CM

## 2021-03-07 DIAGNOSIS — F339 Major depressive disorder, recurrent, unspecified: Secondary | ICD-10-CM

## 2021-03-07 DIAGNOSIS — F902 Attention-deficit hyperactivity disorder, combined type: Secondary | ICD-10-CM

## 2021-03-07 NOTE — Progress Notes (Addendum)
[facility]  Patient ID: Albert [name],  MRN: [id number]  PCP: Esperanza Richters, PA-C  Date of Evaluation: 03/07/2021 Time spent: 120 minutes  Chief Complaint:  Chief Complaint   Follow-up; Depression; Other      HISTORY/CURRENT STATUS: HPI For Spravato treatment.   Albert [name] is here for Spravato.  He is still doing well from the [facility] and would like to continue the treatment.  He is able to enjoy things.  Energy and motivation are good.  Not isolating.  Sleeps well.  Work is going fine.  Does not cry easily.  Appetite and weight are stable.  No suicidal or homicidal thoughts.  2 weeks ago the OCD symptoms worsened.  Obsessive thoughts, ruminating, unable to get things out of his head, "like a broken record."  Things are a little bit better this past week, he would like to see how he does over the next few weeks.  May need to go back to Luvox.  The Trintellix is helping with depression and is not causing sexual dysfunction like the Prozac did.  But we knew going into taking the Trintellix that it was probably not going to help the OCD.  No compulsive actions.   Review of Systems  Constitutional: Negative.   HENT: Negative.    Eyes: Negative.   Respiratory: Negative.    Cardiovascular: Negative.   Gastrointestinal: Negative.   Genitourinary: Negative.   Musculoskeletal: Negative.   Skin: Negative.   Neurological: Negative.   Endo/Heme/Allergies: Negative.   Psychiatric/Behavioral:         See HPI    Individual Medical History/ Review of Systems: Changes? :No   Past medications for mental health diagnoses include: Luvox, Lunesta, Xanax, Wellbutrin, Risperdal, Prozac, Ambien, Deplin, Abilify, naltrexone, Adderall, Spravato  Allergies: Sulfamethoxazole and Sulfonamide derivatives  Current Medications:  Current Outpatient Medications:    Colchicine (MITIGARE) 0.6 MG CAPS, Take 1 capsule by mouth 2 (two) times daily as needed., Disp: 40 capsule, Rfl: 0    Esketamine HCl, 84 MG Dose, (SPRAVATO, 84 MG DOSE,) 28 MG/DEVICE SOPK, Place 84 mg into the nose once a week., Disp: 3 each, Rfl: 2   telmisartan-hydrochlorothiazide (MICARDIS HCT) 80-12.5 MG tablet, Take 1 tablet by mouth daily., Disp: 90 tablet, Rfl: 1   testosterone cypionate (DEPO-TESTOSTERONE) 100 MG/ML injection, Inject 0.3 mLs (30 mg total) into the muscle once a week. And syringes 1/week, Disp: 10 mL, Rfl: 0   vortioxetine HBr (TRINTELLIX) 20 MG TABS tablet, Take 1 tablet (20 mg total) by mouth daily., Disp: 30 tablet, Rfl: 5   ezetimibe (ZETIA) 10 MG tablet, Take 1 tablet (10 mg total) by mouth daily., Disp: 90 tablet, Rfl: 3 Medication Side Effects: none  Family Medical/ Social History: Changes? No  MENTAL HEALTH EXAM:  Before Spravato-BP 139/78 pulse 75 40 minutes after administration of Spravato-BP 134/80, pulse 65 2 hours after administration of Spravato-BP 144/90, pulse 60  There were no vitals taken for this visit.There is no height or weight on file to calculate BMI.  General Appearance: Casual and Well Groomed.  Eye Contact:  Good  Speech:  Clear and Coherent and Normal Rate  Volume:  Normal  Mood:  Euthymic  Affect:  Congruent  Thought Process:  Goal Directed and Descriptions of Associations: Intact  Orientation:  Full (Time, Place, and Person)  Thought Content: Logical   Suicidal Thoughts:  No  Homicidal Thoughts:  No  Memory:  WNL  Judgement:  Good  Insight:  Good  Psychomotor Activity:  Normal  Concentration:  Concentration: Good and Attention Span: Good  Recall:  Good  Fund of Knowledge: Good  Language: Good  Assets:  Desire for Improvement  ADL's:  Intact  Cognition: WNL  Prognosis:  Good    DIAGNOSES:    ICD-10-CM   1. 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 Obsessive-compulsive disorder, unspecified type  F42.9     3. Generalized anxiety disorder  F41.1     4. Seasonal affective disorder (HCC)  F33.8     5.  Attention deficit hyperactivity disorder (ADHD), combined type  F90.2         Receiving Psychotherapy: No    RECOMMENDATIONS:  PDMP was reviewed.  Spravato last filled 02/23/2021. Total time of observation was 120 minutes including face to face time during this encounter, time spent before and after the visit in records review, medical decision making, and charting.  Discussed the OCD. We will continue the Trintellix for a few more weeks. If the OCD does not improve or if it gets worse then we will go back to the Luvox.  The Luvox helped the OCD and did not cause sexual dysfunction as much as the Prozac did.  If we change, wean Trintellix for about a week at 10 mg and start Luvox 50 mg daily.  For now no changes though. Continue Trintellix to 20 mg, 1 p.o. daily.  Continue Spravato 84 mg weekly.  Continue Ativan 0.5 mg, 1 p.o. twice daily as needed.  He has an old prescription and does not take it often. Return in 1 week.  [name], PA-C

## 2021-03-08 ENCOUNTER — Other Ambulatory Visit: Payer: Self-pay | Admitting: Endocrinology

## 2021-03-08 MED ORDER — TESTOSTERONE CYPIONATE 100 MG/ML IM SOLN: 30.0000 mg | INTRAMUSCULAR | 0 refills | Status: DC

## 2021-03-12 NOTE — Progress Notes (Signed)
Nurse Visit:   Pt here for his weekly Spravato Treatments of 84 mg ( 3 of the 28 mg) nasal sprays for his treatment resistant depression. Pt taken to treatment room. No complaints voiced. Pt's Spravato is delivered locally by [facility] and stored at [employer] office in a safe and behind another locked door, due to being a schedule CIII medication. Pt's initial vital signs assessed at 2:10 PM, 139/78, pulse 75. Pt asked to blow his nose and recline his chair to a 45 degree angle to begin his first treatment. Pt given 1 of the 28 mg dose nasal sprays, with 5 minutes between all 3 nasal sprays until completed. Pt brings head phones and an eye mask and asks the lights are turned down. Pt does fall asleep within 30 minutes. Pt's 40 minute vital signs assessed at 2:50 PM, 134/80, pulse 65. Pt resting/sleeping until end of his treatment, at that time [name], PA-C comes in to discuss his progress and treatment. Discharge vital signs assessed at 4:10 PM, 144/90, pulse 60. Pt is feeling good with no complaints. Pt observed a total of 120 minutes in the office per FDA/REMS requirements, his symptoms of dissociation and sleepiness are all resolved prior to discharge. No medications given during treatment for his symptoms. Nursing assessed pt a total of 45 minutes. Pt will be in next week.   LOT [id number] EXP JUL 2024

## 2021-03-13 ENCOUNTER — Other Ambulatory Visit: Payer: Self-pay

## 2021-03-13 MED ORDER — SPRAVATO (84 MG DOSE) 28 MG/DEVICE NA SOPK: 84.0000 mg | PACK | NASAL | 2 refills | Status: DC

## 2021-03-15 ENCOUNTER — Ambulatory Visit (INDEPENDENT_AMBULATORY_CARE_PROVIDER_SITE_OTHER): Payer: 59 | Admitting: Physician Assistant

## 2021-03-15 ENCOUNTER — Other Ambulatory Visit: Payer: Self-pay

## 2021-03-15 ENCOUNTER — Ambulatory Visit: Payer: 59

## 2021-03-15 ENCOUNTER — Encounter: Payer: Self-pay | Admitting: Physician Assistant

## 2021-03-15 VITALS — BP 157/98 | HR 67

## 2021-03-15 VITALS — BP 151/87 | HR 60

## 2021-03-15 DIAGNOSIS — F411 Generalized anxiety disorder: Secondary | ICD-10-CM

## 2021-03-15 DIAGNOSIS — F339 Major depressive disorder, recurrent, unspecified: Secondary | ICD-10-CM | POA: Diagnosis not present

## 2021-03-15 DIAGNOSIS — F902 Attention-deficit hyperactivity disorder, combined type: Secondary | ICD-10-CM

## 2021-03-15 DIAGNOSIS — F338 Other recurrent depressive disorders: Secondary | ICD-10-CM

## 2021-03-15 DIAGNOSIS — F429 Obsessive-compulsive disorder, unspecified: Secondary | ICD-10-CM | POA: Diagnosis not present

## 2021-03-15 MED ORDER — FLUVOXAMINE MALEATE 50 MG PO TABS: 50.0000 mg | ORAL_TABLET | Freq: Every day | ORAL | 0 refills | Status: DC

## 2021-03-15 NOTE — Progress Notes (Signed)
[facility]  Patient ID: Albert [name],  MRN: [id number]  PCP: Esperanza Richters, PA-C  Date of Evaluation: 03/15/2021 Time spent: 120 minutes  Chief Complaint:  Chief Complaint   Depression; Follow-up      HISTORY/CURRENT STATUS: HPI For Spravato treatment.   OCD symptoms are worsening.  We had discussed this a few weeks ago but he wanted to give it a little while longer before changing back to Luvox, which is the one med that helped the OCD the most.  With the Trintellix, it did help with depression and decrease the sexual side effects that he was having with Prozac, but the OCD is intolerable now.  Having ruminating thoughts, cannot get obsessions out of his head.  He already started weaning off the Trintellix.  He took 10 mg daily for a week and then stopped it about 3 days ago.  He has felt a little dizzy and weird in his head since being off of it.  Patient denies loss of interest in usual activities and is able to enjoy things.  Denies decreased energy or motivation.  Appetite has not changed.  No extreme sadness, tearfulness, or feelings of hopelessness.  Denies any changes in concentration, making decisions or remembering things.  Sleeps pretty good.  He does have a bit more anxiety associated with the OCD.  Work is going well. States the Spravato continues to work really good for the depression.  It does start to wear off at about day 5 or 6 and then it is time for another treatment.  Denies suicidal or homicidal thoughts.  During the treatment today he had no headache, dizziness, nausea or vomiting.  Tolerated it well.  Did have dissociation, nothing frightening.  Before leaving the office after the 2-hour observation that had resolved.  Review of Systems  Constitutional: Negative.   HENT: Negative.    Eyes: Negative.   Respiratory: Negative.    Cardiovascular: Negative.   Gastrointestinal: Negative.   Genitourinary: Negative.   Musculoskeletal: Negative.    Skin: Negative.   Neurological: Negative.   Endo/Heme/Allergies: Negative.   Psychiatric/Behavioral:         See HPI.    Individual Medical History/ Review of Systems: Changes? :No   Past medications for mental health diagnoses include: Luvox, Lunesta, Xanax, Wellbutrin, Risperdal, Prozac, Ambien, Deplin, Abilify, naltrexone, Adderall, Spravato, Trintellix was helpful for depression but not OCD so it was discontinued.  Allergies: Sulfamethoxazole and Sulfonamide derivatives  Current Medications:  Current Outpatient Medications:    fluvoxaMINE (LUVOX) 50 MG tablet, Take 1 tablet (50 mg total) by mouth at bedtime., Disp: 90 tablet, Rfl: 0   telmisartan-hydrochlorothiazide (MICARDIS HCT) 80-12.5 MG tablet, Take 1 tablet by mouth daily., Disp: 90 tablet, Rfl: 1   Colchicine (MITIGARE) 0.6 MG CAPS, Take 1 capsule by mouth 2 (two) times daily as needed. (Patient not taking: Reported on 03/15/2021), Disp: 40 capsule, Rfl: 0   Esketamine HCl, 84 MG Dose, (SPRAVATO, 84 MG DOSE,) 28 MG/DEVICE SOPK, Place 84 mg into the nose once a week., Disp: 3 each, Rfl: 2   ezetimibe (ZETIA) 10 MG tablet, Take 1 tablet (10 mg total) by mouth daily., Disp: 90 tablet, Rfl: 3   testosterone cypionate (DEPO-TESTOSTERONE) 100 MG/ML injection, Inject 0.3 mLs (30 mg total) into the muscle once a week. And syringes 1/week, Disp: 10 mL, Rfl: 0   vortioxetine HBr (TRINTELLIX) 20 MG TABS tablet, Take 1 tablet (20 mg total) by mouth daily. (Patient not taking: Reported on 03/15/2021),  Disp: 30 tablet, Rfl: 5 Medication Side Effects: none  Family Medical/ Social History: Changes? No  MENTAL HEALTH EXAM:  Before Spravato-BP 157/98, pulse 67 40 minutes after administration of Spravato-BP 138/89, P 61 2 hours after administration of Spravato-BP 151/87, P 60  Blood pressure (!) 157/98, pulse 67.There is no height or weight on file to calculate BMI.  General Appearance: Casual and Well Groomed  Eye Contact:  Good  Speech:   Clear and Coherent and Normal Rate  Volume:  Normal  Mood:  Euthymic  Affect:  Appropriate, Congruent, and is a little drowsy.  Thought Process:  Goal Directed and Descriptions of Associations: Intact  Orientation:  Full (Time, Place, and Person)  Thought Content: Logical   Suicidal Thoughts:  No  Homicidal Thoughts:  No  Memory:  WNL  Judgement:  Good  Insight:  Good  Psychomotor Activity:  Normal  Concentration:  Concentration: Good and Attention Span: Good  Recall:  Good  Fund of Knowledge: Good  Language: Good  Assets:  Desire for Improvement  ADL's:  Intact  Cognition: WNL  Prognosis:  Good    DIAGNOSES:    ICD-10-CM   1. 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 Obsessive-compulsive disorder, unspecified type  F42.9     3. Generalized anxiety disorder  F41.1     4. Seasonal affective disorder (HCC)  F33.8     5. Attention deficit hyperactivity disorder (ADHD), combined type  F90.2       Receiving Psychotherapy: No    RECOMMENDATIONS:  PDMP was reviewed.  Spravato last filled 03/13/2021. Total time of observation was 120 minutes including face to face time during this encounter, time spent before and after the visit in records review, medical decision making, counseling pertinent to today's visit, and charting.  He continues to respond very well to the Spravato weekly.  No changes. Discussed the OCD.  He has already stopped the Trintellix.  He responded well to the Luvox for the OCD but it did not help depression.  We will restart Luvox.  He has old medications at home and will start that.  No other changes will be made. Re-Start Luvox 50 mg, 1 p.o. nightly. Continue Spravato 84 mg weekly.  Continue Ativan 0.5 mg, 1 p.o. twice daily as needed.  He has an old prescription and does not take it often. Return in 1 week.  [name], PA-C

## 2021-03-16 ENCOUNTER — Other Ambulatory Visit: Payer: Self-pay

## 2021-03-16 ENCOUNTER — Other Ambulatory Visit: Payer: Self-pay | Admitting: Endocrinology

## 2021-03-16 MED ORDER — TESTOSTERONE CYPIONATE 100 MG/ML IM SOLN: 30.0000 mg | INTRAMUSCULAR | 0 refills | Status: DC

## 2021-03-16 MED ORDER — SPRAVATO (84 MG DOSE) 28 MG/DEVICE NA SOPK: 84.0000 mg | PACK | NASAL | 2 refills | Status: DC

## 2021-03-16 NOTE — Progress Notes (Signed)
Nurse Visit:   Pt here for his weekly Spravato Treatments of 84 mg ( 3 of the 28 mg) nasal sprays for his treatment resistant depression. Pt taken to treatment room. No complaints voiced. Pt's Spravato is delivered locally by [facility] and stored at [employer] office in a safe and behind another locked door, due to being a schedule CIII medication. Pt's initial vital signs assessed at 2:20 PM, 157/98, pulse 67. Pt asked to blow his nose and recline his chair to a 45 degree angle to begin his first treatment. Pt given 1 of the 28 mg dose nasal sprays, with 5 minutes between all 3 nasal sprays until completed. Pt brings head phones and an eye mask and asks the lights are turned down. Pt does fall asleep within 30 minutes. Pt's 40 minute vital signs assessed at 3:15 PM, 138/89, pulse 61. Pt resting/sleeping until end of his treatment, at that time [name], PA-C comes in to discuss his progress and treatment. Discharge vital signs assessed at 4:20 PM, 151/87, pulse 60. Pt is feeling good with no complaints related to Spravato. Pt is making changes with his Trintellix, see Teresa's office note. Pt will start Luvox.  Pt observed a total of 120 minutes in the office per FDA/REMS requirements, his symptoms of dissociation and sleepiness are all resolved prior to discharge. No medications given during treatment for his symptoms. Nursing assessed pt a total of 45 minutes. Pt will be in next week on Tuesday, October 11th.   LOT [id number] EXP JUL 2024

## 2021-03-18 ENCOUNTER — Encounter: Payer: Self-pay | Admitting: Physician Assistant

## 2021-03-19 ENCOUNTER — Other Ambulatory Visit (HOSPITAL_COMMUNITY): Payer: Self-pay

## 2021-03-19 ENCOUNTER — Other Ambulatory Visit: Payer: Self-pay | Admitting: Endocrinology

## 2021-03-19 MED ORDER — TESTOSTERONE CYPIONATE 100 MG/ML IM SOLN
30.0000 mg | INTRAMUSCULAR | 0 refills | Status: AC
  Filled 2021-03-19: qty 10, 28d supply, fill #0
  Filled 2021-03-19: qty 10, 231d supply, fill #0
  Filled 2021-03-19: qty 10, 90d supply, fill #0

## 2021-03-20 ENCOUNTER — Ambulatory Visit: Payer: 59 | Admitting: Physician Assistant

## 2021-03-29 ENCOUNTER — Ambulatory Visit: Payer: 59 | Admitting: Physician Assistant

## 2021-03-29 ENCOUNTER — Ambulatory Visit: Payer: 59

## 2021-03-29 ENCOUNTER — Encounter: Payer: Self-pay | Admitting: Physician Assistant

## 2021-03-29 ENCOUNTER — Ambulatory Visit (INDEPENDENT_AMBULATORY_CARE_PROVIDER_SITE_OTHER): Payer: 59 | Admitting: Physician Assistant

## 2021-03-29 ENCOUNTER — Other Ambulatory Visit: Payer: Self-pay

## 2021-03-29 VITALS — BP 131/74 | HR 73

## 2021-03-29 VITALS — BP 135/78 | HR 78

## 2021-03-29 DIAGNOSIS — F411 Generalized anxiety disorder: Secondary | ICD-10-CM | POA: Diagnosis not present

## 2021-03-29 DIAGNOSIS — F339 Major depressive disorder, recurrent, unspecified: Secondary | ICD-10-CM

## 2021-03-29 DIAGNOSIS — F429 Obsessive-compulsive disorder, unspecified: Secondary | ICD-10-CM | POA: Diagnosis not present

## 2021-03-29 NOTE — Progress Notes (Signed)
[facility]  Patient ID: Albert [name],  MRN: [id number]  PCP: Esperanza Richters, PA-C  Date of Evaluation: 03/29/2021 Time spent: 120 minutes  Chief Complaint:  Chief Complaint   Depression; Anxiety; Other      HISTORY/CURRENT STATUS: HPI For Spravato treatment.   OCD symptoms have improved since getting back on the Luvox.  He is only on 50 mg.  States his OCD symptoms went down from a 7 or 8 to a 3 now.  So it is working very well for that.  States it really does not help depression and did not in the past but the depression is responding well to the Spravato.  He did not take it last week because he was sick but will get back on weekly schedule now.    He is able to enjoy things.  Denies decreased energy or motivation.  Appetite has not changed.  No extreme sadness, tearfulness, or feelings of hopelessness.  Denies any changes in concentration, making decisions or remembering things.  Sleeps pretty good.  Work is going well. Denies suicidal or homicidal thoughts.  During the treatment today he had no headache, dizziness, nausea or vomiting.  Tolerated it well.  Did have dissociation, nothing frightening.  Before leaving the office after the 2-hour observation, that had resolved.  Review of Systems  Constitutional: Negative.   HENT: Negative.    Eyes: Negative.   Respiratory: Negative.    Cardiovascular: Negative.   Gastrointestinal: Negative.   Genitourinary: Negative.   Musculoskeletal: Negative.   Skin: Negative.   Neurological: Negative.   Endo/Heme/Allergies: Negative.   Psychiatric/Behavioral:         See HPI.    Individual Medical History/ Review of Systems: Changes? :No   Past medications for mental health diagnoses include: Luvox, Lunesta, Xanax, Wellbutrin, Risperdal, Prozac, Ambien, Deplin, Abilify, naltrexone, Adderall, Spravato, Trintellix was helpful for depression but not OCD so it was discontinued.  Allergies: Sulfamethoxazole and Sulfonamide  derivatives  Current Medications:  Current Outpatient Medications:    Esketamine HCl, 84 MG Dose, (SPRAVATO, 84 MG DOSE,) 28 MG/DEVICE SOPK, Place 84 mg into the nose once a week., Disp: 3 each, Rfl: 2   ezetimibe (ZETIA) 10 MG tablet, Take 1 tablet (10 mg total) by mouth daily., Disp: 90 tablet, Rfl: 3   fluvoxaMINE (LUVOX) 50 MG tablet, Take 1 tablet (50 mg total) by mouth at bedtime., Disp: 90 tablet, Rfl: 0   telmisartan-hydrochlorothiazide (MICARDIS HCT) 80-12.5 MG tablet, Take 1 tablet by mouth daily., Disp: 90 tablet, Rfl: 1   testosterone cypionate (DEPO-TESTOSTERONE) 100 MG/ML injection, Inject 0.3 mLs (30 mg total) into the muscle once a week. Discard vial 28 days after first use., Disp: 10 mL, Rfl: 0   Colchicine (MITIGARE) 0.6 MG CAPS, Take 1 capsule by mouth 2 (two) times daily as needed. (Patient not taking: No sig reported), Disp: 40 capsule, Rfl: 0 Medication Side Effects: none  He doesn't mention sexual SE now.  Family Medical/ Social History: Changes? No  MENTAL HEALTH EXAM:  Before Spravato-BP 135/78, pulse 78  40 minutes after administration of Spravato-BP 134/85 pulse 69  2 hours after administration of Spravato-BP 131/74, pulse 73  Blood pressure 135/78, pulse 78.There is no height or weight on file to calculate BMI.  General Appearance: Casual and Well Groomed  Eye Contact:  Good  Speech:  Clear and Coherent and Normal Rate  Volume:  Normal  Mood:  Euthymic  Affect:  Appropriate and Congruent   Thought Process:  Goal Directed and Descriptions of Associations: Intact  Orientation:  Full (Time, Place, and Person)  Thought Content: Logical   Suicidal Thoughts:  No  Homicidal Thoughts:  No  Memory:  WNL  Judgement:  Good  Insight:  Good  Psychomotor Activity:  Normal  Concentration:  Concentration: Good and Attention Span: Good  Recall:  Good  Fund of Knowledge: Good  Language: Good  Assets:  Desire for Improvement  ADL's:  Intact  Cognition: WNL   Prognosis:  Good   DIAGNOSES:    ICD-10-CM   1. 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 Obsessive-compulsive disorder, unspecified type  F42.9     3. Generalized anxiety disorder  F41.1        Receiving Psychotherapy: No    RECOMMENDATIONS:  PDMP was reviewed.  Spravato last filled 03/28/2021.   Total time of observation was 120 minutes including face to face time during this encounter, time spent before and after the visit in records review, medical decision making, counseling pertinent to today's visit, and charting.  Kadence continues to respond very well to the [employer].  We will continue weekly treatments.   Continue Luvox 50 mg, 1 p.o. nightly for OCD and depression. Continue Spravato 84 mg weekly.  Continue Ativan 0.5 mg, 1 p.o. twice daily as needed.  He has an old prescription and does not take it often. Return in 1 week.  [name], PA-C

## 2021-04-01 ENCOUNTER — Encounter: Payer: Self-pay | Admitting: Physician Assistant

## 2021-04-01 NOTE — Progress Notes (Signed)
NURSE VISIT:   Pt here for his weekly Spravato Treatments of 84 mg ( 3 of the 28 mg) nasal sprays for his treatment resistant depression. Pt taken to treatment room. No complaints voiced prior to starting treatment. Spravato is delivered locally by [facility] and stored at [employer] office in a safe behind another locked door, due to being a schedule CIII medication. Pt's initial vital signs assessed at 2:00 PM 135/78, pulse 78. Pt asked to blow his nose and recline his chair to 45 degree angle to begin first treatment. Pt given 1 of the 28 mg dose nasal sprays, with 5 minutes between all 3 doses until completed. Pt brings head phones and an eye mask and ask the lights be turned down for comfort. Pt does fall asleep within 30 minutes. Pt's 40 minute vital signs assessed at 2:40 PM, 134/85, pulse 69. Pt resting/sleeping until end of treatment then [name] PA-C comes in to discuss his progress and treatment. Discharge vital signs assessed at 4:00 PM, 131/74, pulse 73. Pt was observed a total of 120 minutes per FDA/REMS requirements, his symptoms of dissociation and sleepiness are all resolved prior to his discharge. No medications given during his treatment for any symptoms. Nursing assessed pt a total of 45 minutes. Pt will be in next week on Thursday, October 27th.  LOT [id number] EXP [id number]

## 2021-04-05 ENCOUNTER — Ambulatory Visit (INDEPENDENT_AMBULATORY_CARE_PROVIDER_SITE_OTHER): Payer: 59 | Admitting: Physician Assistant

## 2021-04-05 ENCOUNTER — Other Ambulatory Visit: Payer: Self-pay

## 2021-04-05 ENCOUNTER — Encounter: Payer: Self-pay | Admitting: Physician Assistant

## 2021-04-05 ENCOUNTER — Ambulatory Visit: Payer: 59

## 2021-04-05 VITALS — BP 131/78 | HR 78

## 2021-04-05 VITALS — BP 144/86 | HR 73

## 2021-04-05 DIAGNOSIS — F339 Major depressive disorder, recurrent, unspecified: Secondary | ICD-10-CM | POA: Diagnosis not present

## 2021-04-05 DIAGNOSIS — F429 Obsessive-compulsive disorder, unspecified: Secondary | ICD-10-CM | POA: Diagnosis not present

## 2021-04-05 DIAGNOSIS — F411 Generalized anxiety disorder: Secondary | ICD-10-CM

## 2021-04-05 DIAGNOSIS — F902 Attention-deficit hyperactivity disorder, combined type: Secondary | ICD-10-CM | POA: Diagnosis not present

## 2021-04-05 DIAGNOSIS — G4700 Insomnia, unspecified: Secondary | ICD-10-CM

## 2021-04-05 DIAGNOSIS — F338 Other recurrent depressive disorders: Secondary | ICD-10-CM

## 2021-04-05 NOTE — Progress Notes (Signed)
[facility]  Patient ID: Albert [name],  MRN: [id number]  PCP: Esperanza Richters, PA-C  Date of Evaluation: 04/05/2021 Time spent: 120 minutes  Chief Complaint:  Chief Complaint   Depression; Anxiety; Follow-up      HISTORY/CURRENT STATUS: HPI For Spravato treatment.   OCD symptoms have improved since getting back on the Luvox.  He is still only taking 50 mg and feels that it is adequate for now.  He does feel a little bit tired but otherwise no side effects from changing the Trintellix to this.  He is able to enjoy things.  Denies decreased energy or motivation.  Appetite is normal. Weight stable.  No extreme sadness, tearfulness, or feelings of hopelessness.  Denies any changes in concentration, making decisions or remembering things.  He sleeps well and work is going fine. Denies suicidal or homicidal thoughts.  The Spravato continues to help.  He is having no side effects from the medication.  He tolerated it well today, denies headaches, blurred vision, slurred speech, or neurologic deficits.  Had dissociation for about an hour but resolved before he left the office.  Review of Systems  Constitutional: Negative.   HENT: Negative.    Eyes: Negative.   Respiratory: Negative.    Cardiovascular: Negative.   Gastrointestinal: Negative.   Genitourinary: Negative.   Musculoskeletal: Negative.   Skin: Negative.   Neurological: Negative.   Endo/Heme/Allergies: Negative.   Psychiatric/Behavioral:         See HPI.    Individual Medical History/ Review of Systems: Changes? :No   Past medications for mental health diagnoses include: Luvox, Lunesta, Xanax, Wellbutrin, Risperdal, Prozac, Ambien, Deplin, Abilify, naltrexone, Adderall, Spravato, Trintellix was helpful for depression but not OCD so it was discontinued.  Allergies: Sulfamethoxazole and Sulfonamide derivatives  Current Medications:  Current Outpatient Medications:    Colchicine (MITIGARE) 0.6 MG CAPS,  Take 1 capsule by mouth 2 (two) times daily as needed. (Patient not taking: No sig reported), Disp: 40 capsule, Rfl: 0   Esketamine HCl, 84 MG Dose, (SPRAVATO, 84 MG DOSE,) 28 MG/DEVICE SOPK, Place 84 mg into the nose once a week., Disp: 3 each, Rfl: 2   ezetimibe (ZETIA) 10 MG tablet, Take 1 tablet (10 mg total) by mouth daily., Disp: 90 tablet, Rfl: 3   fluvoxaMINE (LUVOX) 50 MG tablet, Take 1 tablet (50 mg total) by mouth at bedtime., Disp: 90 tablet, Rfl: 0   telmisartan-hydrochlorothiazide (MICARDIS HCT) 80-12.5 MG tablet, Take 1 tablet by mouth daily., Disp: 90 tablet, Rfl: 1   testosterone cypionate (DEPO-TESTOSTERONE) 100 MG/ML injection, Inject 0.3 mLs (30 mg total) into the muscle once a week. Discard vial 28 days after first use., Disp: 10 mL, Rfl: 0 Medication Side Effects: none  He doesn't mention sexual SE now.  Family Medical/ Social History: Changes? No  MENTAL HEALTH EXAM:  Before Spravato-BP 131/78, pulse 78 40 minutes after administration of Spravato-BP 142/79, pulse 72 2 hours after administration of Spravato-BP 144/86, pulse 73  Blood pressure 131/78, pulse 78.There is no height or weight on file to calculate BMI.  General Appearance: Casual and Well Groomed  Eye Contact:  Good  Speech:  Clear and Coherent and Normal Rate  Volume:  Normal  Mood:  Euthymic  Affect:  Appropriate and Congruent   Thought Process:  Goal Directed and Descriptions of Associations: Circumstantial  Orientation:  Full (Time, Place, and Person)  Thought Content: Logical   Suicidal Thoughts:  No  Homicidal Thoughts:  No  Memory:  WNL  Judgement:  Good  Insight:  Good  Psychomotor Activity:  Normal  Concentration:  Concentration: Good and Attention Span: Good  Recall:  Good  Fund of Knowledge: Good  Language: Good  Assets:  Desire for Improvement  ADL's:  Intact  Cognition: WNL  Prognosis:  Good   DIAGNOSES:    ICD-10-CM   1. Recurrent major depression resistant to treatment (HCC)   F33.9     2. Obsessive-compulsive disorder, unspecified type  F42.9     3. Generalized anxiety disorder  F41.1     4. Attention deficit hyperactivity disorder (ADHD), combined type  F90.2     5. Seasonal affective disorder (HCC)  F33.8     6. Insomnia, unspecified type  G47.00       Receiving Psychotherapy: No    RECOMMENDATIONS:  PDMP was reviewed.  Spravato last filled 04/03/2021.   Total time of observation was 120 minutes including face to face time during this encounter, time spent before and after the visit in records review, medical decision making, counseling pertinent to today's visit, and charting.  He continues to respond very well to the Spravato.  We will continue weekly treatments.   Continue Luvox 50 mg, 1 p.o. nightly for OCD and depression. Continue Spravato 84 mg weekly.  Continue Ativan 0.5 mg, 1 p.o. twice daily as needed.  He has an old prescription and does not take it often. Return in 1 week.  [name], PA-C

## 2021-04-08 NOTE — Progress Notes (Signed)
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 Spravato is delivered locally by [facility] and stored at [employer] office in a safe behind another locked door, due to being a schedule CIII medication. Pt taken to treatment room. No complaints voiced prior to starting treatment. Initial vital signs assessed at 2:00 PM 131/78, 78. Pt asked to blow his nose and recline his chair to 45 degree angle to begin his treatment. Pt given 1 of the 28 mg dose nasal sprays, with 5 minutes in between all 3 doses until completed. Pt brings head phones and an eye mask, the lights are turned down for comfort. Pt does fall asleep within 30 minutes. Pt's vital signs assessed at 40 minutes, 2:45 PM, 142/79, 72. Pt resting/sleeping until end of treatment then [name] PA-C comes in to discuss his progress and treatment. Discharge vital signs assessed at 4:05, 144/86, 73. Pt was observed a total 120 minutes per FDA/REMS requirements, his symptoms of dissociation and sleepiness are all resolved prior to his discharge. No medications given during his treatment for any symptoms. Nursing assessed pt a total of 45 minutes. Pt will be scheduled next week on Thursday, November 3rd.     LOT [id number] EXP JUL 2024

## 2021-04-09 ENCOUNTER — Encounter: Payer: Self-pay | Admitting: Physician Assistant

## 2021-04-12 ENCOUNTER — Other Ambulatory Visit: Payer: Self-pay

## 2021-04-12 ENCOUNTER — Ambulatory Visit (INDEPENDENT_AMBULATORY_CARE_PROVIDER_SITE_OTHER): Payer: 59 | Admitting: Physician Assistant

## 2021-04-12 ENCOUNTER — Ambulatory Visit: Payer: 59

## 2021-04-12 VITALS — BP 131/76 | HR 70

## 2021-04-12 VITALS — BP 141/89 | HR 90

## 2021-04-12 DIAGNOSIS — F339 Major depressive disorder, recurrent, unspecified: Secondary | ICD-10-CM

## 2021-04-12 DIAGNOSIS — F429 Obsessive-compulsive disorder, unspecified: Secondary | ICD-10-CM

## 2021-04-13 NOTE — Progress Notes (Signed)
Pt here for his weekly Spravato Treatment of 84 mg (3 of the 28 mg) nasal sprays for his treatment resistant depression. Spravato is delivered locally by [facility] and stored at [employer] office in a safe behind another locked door, due to being a schedule CIII medication. Pt taken to treatment room. No complaints voiced prior to starting treatment. Initial vital signs assessed at 2:10 PM 141/80, 90. Pt asked to blow his nose and recline his chair to 45 degree angle to begin his treatment. Pt given 1 of the 28 mg dose nasal sprays, with 5 minutes in between all 3 doses until completed. Pt brings head phones and an eye mask, the lights are turned down for comfort. Pt does fall asleep within 30 minutes. Pt's vital signs assessed at 40 minutes, 2:50 PM, 145/84, 70. Pt resting/sleeping until end of treatment then [name] PA-C comes in to discuss his progress and treatment. Discharge vital signs assessed at 4:05, 131/76, 75. Pt was observed a total 120 minutes per FDA/REMS requirements, his symptoms of dissociation and sleepiness are all resolved prior to his discharge. No medications given during his treatment for any symptoms. Nursing assessed pt a total of 45 minutes. Pt will be scheduled next week on Thursday, November 10th.       LOT [id number] EXP JUL 2024

## 2021-04-19 ENCOUNTER — Ambulatory Visit: Payer: 59

## 2021-04-19 ENCOUNTER — Other Ambulatory Visit: Payer: Self-pay

## 2021-04-19 ENCOUNTER — Ambulatory Visit (INDEPENDENT_AMBULATORY_CARE_PROVIDER_SITE_OTHER): Payer: 59 | Admitting: Physician Assistant

## 2021-04-19 ENCOUNTER — Encounter: Payer: Self-pay | Admitting: Physician Assistant

## 2021-04-19 VITALS — BP 139/85 | HR 69

## 2021-04-19 VITALS — BP 148/77 | HR 72

## 2021-04-19 DIAGNOSIS — F411 Generalized anxiety disorder: Secondary | ICD-10-CM | POA: Diagnosis not present

## 2021-04-19 DIAGNOSIS — F339 Major depressive disorder, recurrent, unspecified: Secondary | ICD-10-CM

## 2021-04-19 DIAGNOSIS — F902 Attention-deficit hyperactivity disorder, combined type: Secondary | ICD-10-CM | POA: Diagnosis not present

## 2021-04-19 DIAGNOSIS — F338 Other recurrent depressive disorders: Secondary | ICD-10-CM

## 2021-04-19 DIAGNOSIS — F429 Obsessive-compulsive disorder, unspecified: Secondary | ICD-10-CM

## 2021-04-19 DIAGNOSIS — G4700 Insomnia, unspecified: Secondary | ICD-10-CM

## 2021-04-19 MED ORDER — SPRAVATO (84 MG DOSE) 28 MG/DEVICE NA SOPK: 84.0000 mg | PACK | NASAL | 2 refills | Status: DC

## 2021-04-19 NOTE — Progress Notes (Signed)
[facility]  Patient ID: Albert [name],  MRN: [id number]  PCP: Esperanza Richters, PA-C  Date of Evaluation: 04/19/2021 Time spent: 120 minutes  Chief Complaint:  Chief Complaint   Depression; Other     HISTORY/CURRENT STATUS: HPI For Spravato treatment.   He continues to do well with the weekly Spravato treatments.  He is able to enjoy things that he was not able to do prior to starting on the Spravato.  Energy and motivation are good most of the time.  He is sleeping well.  Work is going fine.  He does report seasonal affective disorder, it happens every year at this time.  No suicidal or homicidal thoughts.  Continues to have symptoms of OCD but they are not nearly as severe as they used to be.  He feels that the Luvox is working very well.  In the past when the dose was increased he felt more like a zombie so even though he still having some symptoms he prefers to stay at the lower dose of Luvox.  It helps enough without causing unwanted side effects.  He does still experience anxiety at times but not bad and he rarely takes the Xanax.  States that attention is good without easy distractibility.  Able to focus on things and finish tasks to completion.   Patient denies increased energy with decreased need for sleep, no increased talkativeness, no racing thoughts, no impulsivity or risky behaviors, no increased spending, no increased libido, no grandiosity, no increased irritability or anger, and no hallucinations.  Review of Systems  Constitutional: Negative.   HENT: Negative.    Eyes: Negative.   Respiratory: Negative.    Cardiovascular: Negative.   Gastrointestinal: Negative.   Genitourinary: Negative.   Musculoskeletal: Negative.   Skin: Negative.   Neurological: Negative.   Endo/Heme/Allergies: Negative.   Psychiatric/Behavioral:         See HPI.    Individual Medical History/ Review of Systems: Changes? :No   Past medications for mental health diagnoses  include: Luvox, Lunesta, Xanax, Wellbutrin, Risperdal, Prozac, Ambien, Deplin, Abilify, naltrexone, Adderall, Spravato, Trintellix was helpful for depression but not OCD so it was discontinued.  Allergies: Sulfamethoxazole and Sulfonamide derivatives  Current Medications:  Current Outpatient Medications:    ezetimibe (ZETIA) 10 MG tablet, Take 1 tablet (10 mg total) by mouth daily., Disp: 90 tablet, Rfl: 3   fluvoxaMINE (LUVOX) 50 MG tablet, Take 1 tablet (50 mg total) by mouth at bedtime., Disp: 90 tablet, Rfl: 0   telmisartan-hydrochlorothiazide (MICARDIS HCT) 80-12.5 MG tablet, Take 1 tablet by mouth daily., Disp: 90 tablet, Rfl: 1   testosterone cypionate (DEPO-TESTOSTERONE) 100 MG/ML injection, Inject 0.3 mLs (30 mg total) into the muscle once a week. Discard vial 28 days after first use., Disp: 10 mL, Rfl: 0   Colchicine (MITIGARE) 0.6 MG CAPS, Take 1 capsule by mouth 2 (two) times daily as needed. (Patient not taking: No sig reported), Disp: 40 capsule, Rfl: 0   Esketamine HCl, 84 MG Dose, (SPRAVATO, 84 MG DOSE,) 28 MG/DEVICE SOPK, Place 84 mg into the nose once a week., Disp: 3 each, Rfl: 2 Medication Side Effects: none    Family Medical/ Social History: Changes? No  MENTAL HEALTH EXAM:  Before Spravato-b/p 148/77, pulse 72 40 minutes after administration of Spravato-BP 139/86, pulse 64 2 hours after administration of Spravato-BP 139/85, pulse 69.   Blood pressure (!) 148/77, pulse 72.There is no height or weight on file to calculate BMI.  General Appearance: Casual and  Well Groomed  Eye Contact:  Good  Speech:  Clear and Coherent and Normal Rate  Volume:  Normal  Mood:  Euthymic  Affect:  Appropriate and Congruent  Thought Process:  Goal Directed and Descriptions of Associations: Circumstantial  Orientation:  Full (Time, Place, and Person)  Thought Content: Logical   Suicidal Thoughts:  No  Homicidal Thoughts:  No  Memory:  WNL  Judgement:  Good  Insight:  Good   Psychomotor Activity:  Normal  Concentration:  Concentration: Good and Attention Span: Good  Recall:  Good  Fund of Knowledge: Good  Language: Good  Assets:  Desire for Improvement Financial Resources/Insurance Housing Physical Health Social Support Transportation Vocational/Educational  ADL's:  Intact  Cognition: WNL  Prognosis:  Good   He tolerated the Spravato well today.  No headaches, nausea or vomiting, blurred vision, slurred speech, or any other neurologic complaints.  DIAGNOSES:    ICD-10-CM   1. 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 Obsessive-compulsive disorder, unspecified type  F42.9     3. Generalized anxiety disorder  F41.1     4. Attention deficit hyperactivity disorder (ADHD), combined type  F90.2     5. Seasonal affective disorder (HCC)  F33.8     6. Insomnia, unspecified type  G47.00         Receiving Psychotherapy: No    RECOMMENDATIONS:  PDMP was reviewed.  Spravato last filled 04/18/2021.  Testosterone filled 03/19/2021. Total time of observation was 120 minutes including face to face time during this encounter, time spent before and after the visit in records review, medical decision making, counseling pertinent to today's visit, and charting.  He continues to respond very well to the Spravato.  We will continue weekly treatments.   We discussed the seasonal affective disorder.  Briefly discussed increasing the Luvox but again he reminded me that he did not have any improvement as far as depression went with that but it made him feel flat so he prefers to leave things the same which is fine.  We will continue to monitor as he is seen once a week and he knows to call if something comes up in the interim. Continue Luvox 50 mg, 1 p.o. nightly for OCD and depression.   Continue Spravato 84 mg weekly.  Continue Ativan 0.5 mg, 1 p.o. twice daily as needed.  He has an old prescription and does not take it often. Return in  1 week.  [name], PA-C

## 2021-04-19 NOTE — Progress Notes (Signed)
[facility]  Patient ID: Albert [name],  MRN: [id number]  PCP: Esperanza Richters, PA-C  Date of Evaluation: 04/12/2021 Time spent: 120 minutes  Chief Complaint:  Chief Complaint   Depression; Other; Follow-up      HISTORY/CURRENT STATUS: HPI For Spravato treatment.   OCD symptoms have improved since getting back on the Luvox.  He is still only taking 50 mg but not sure that it is adequate at this point.  Still has obsessions.  Does not want to make any changes just yet.  The Spravato continues to help with the depression.  He can tell by the 6-day or so after the treatment that it is time for another 1.  He is able to enjoy things.  Denies decreased energy or motivation.  Appetite is normal. Weight stable.  No extreme sadness, tearfulness, or feelings of hopelessness.  Denies any changes in concentration, making decisions or remembering things.  He sleeps well and work is going fine.  Not having any anxiety to speak of.  Denies suicidal or homicidal thoughts.  Review of Systems  Constitutional: Negative.   HENT: Negative.    Eyes: Negative.   Respiratory: Negative.    Cardiovascular: Negative.   Gastrointestinal: Negative.   Genitourinary: Negative.   Musculoskeletal: Negative.   Skin: Negative.   Neurological: Negative.   Endo/Heme/Allergies: Negative.   Psychiatric/Behavioral:         See HPI.    Individual Medical History/ Review of Systems: Changes? :No   Past medications for mental health diagnoses include: Luvox, Lunesta, Xanax, Wellbutrin, Risperdal, Prozac, Ambien, Deplin, Abilify, naltrexone, Adderall, Spravato, Trintellix was helpful for depression but not OCD so it was discontinued.  Allergies: Sulfamethoxazole and Sulfonamide derivatives  Current Medications:  Current Outpatient Medications:    Esketamine HCl, 84 MG Dose, (SPRAVATO, 84 MG DOSE,) 28 MG/DEVICE SOPK, Place 84 mg into the nose once a week., Disp: 3 each, Rfl: 2   ezetimibe (ZETIA) 10  MG tablet, Take 1 tablet (10 mg total) by mouth daily., Disp: 90 tablet, Rfl: 3   fluvoxaMINE (LUVOX) 50 MG tablet, Take 1 tablet (50 mg total) by mouth at bedtime., Disp: 90 tablet, Rfl: 0   telmisartan-hydrochlorothiazide (MICARDIS HCT) 80-12.5 MG tablet, Take 1 tablet by mouth daily., Disp: 90 tablet, Rfl: 1   testosterone cypionate (DEPO-TESTOSTERONE) 100 MG/ML injection, Inject 0.3 mLs (30 mg total) into the muscle once a week. Discard vial 28 days after first use., Disp: 10 mL, Rfl: 0   Colchicine (MITIGARE) 0.6 MG CAPS, Take 1 capsule by mouth 2 (two) times daily as needed. (Patient not taking: No sig reported), Disp: 40 capsule, Rfl: 0 Medication Side Effects: none  He doesn't mention sexual SE now.  Family Medical/ Social History: Changes? No  MENTAL HEALTH EXAM:  Before Spravato-141/89, pulse 90  40 minutes after administration of Spravato-BP 145/84, pulse 70 2 hours after administration of Spravato-BP 131/76, pulse 75  Blood pressure (!) 141/89, pulse 90.There is no height or weight on file to calculate BMI.  General Appearance: Casual and Well Groomed  Eye Contact:  Good  Speech:  Clear and Coherent and Normal Rate  Volume:  Normal  Mood:  Euthymic  Affect:  Appropriate and Congruent   Thought Process:  Goal Directed and Descriptions of Associations: Circumstantial  Orientation:  Full (Time, Place, and Person)  Thought Content: Logical   Suicidal Thoughts:  No  Homicidal Thoughts:  No  Memory:  WNL  Judgement:  Good  Insight:  Good  Psychomotor Activity:  Normal  Concentration:  Concentration: Good and Attention Span: Good  Recall:  Good  Fund of Knowledge: Good  Language: Good  Assets:  Desire for Improvement  ADL's:  Intact  Cognition: WNL  Prognosis:  Good   He is having no side effects from Spravato. He tolerated it well today, denies headaches, blurred vision, slurred speech, or neurologic deficits.  Had dissociation for about an hour but resolved before he  left the office.  DIAGNOSES:    ICD-10-CM   1. 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 Obsessive-compulsive disorder, unspecified type  F42.9        Receiving Psychotherapy: No    RECOMMENDATIONS:  PDMP was reviewed.  Spravato last filled 04/18/2021.  Testosterone filled 03/19/2021. Total time of observation was 120 minutes including face to face time during this encounter, time spent before and after the visit in records review, medical decision making, counseling pertinent to today's visit, and charting.  He continues to respond very well to the Spravato.  We will continue weekly treatments.   Continue Luvox 50 mg, 1 p.o. nightly for OCD and depression.  Consider increasing to 75 mg. Continue Spravato 84 mg weekly.  Continue Ativan 0.5 mg, 1 p.o. twice daily as needed.  He has an old prescription and does not take it often. Return in 1 week.  [name], PA-C

## 2021-04-22 NOTE — Progress Notes (Signed)
NURSE VISIT:  Pt here for his weekly Spravato Treatment of 84 mg (3 of the 28 mg) nasal sprays for his treatment resistant depression. Spravato is delivered locally by [facility] and stored at [employer] office in a safe behind another locked door, due to being a schedule CIII medication. Pt taken to treatment room. No complaints voiced prior to starting treatment. Initial vital signs assessed at 1:55 PM 148/77, 72. Pt asked to blow his nose and recline his chair to 45 degree angle to begin his treatment. Pt given 1 of the 28 mg dose nasal sprays, with 5 minutes in between all 3 doses until completed. Pt brings head phones and an eye mask, the lights are turned down for comfort. Pt does fall asleep within 30 minutes. Pt's vital signs assessed at 40 minutes, 2:35 PM, 139/86, 64. Pt resting/sleeping until end of treatment then [name] PA-C comes in to discuss his progress and treatment. Discharge vital signs assessed at 3:50 PM, 139/85, 69. Pt was observed a total 120 minutes per FDA/REMS requirements, his symptoms of dissociation and sleepiness are all resolved prior to his discharge. No medications given during his treatment for any symptoms. Nursing assessed pt a total of 45 minutes. Pt will be scheduled next week on Thursday, November 17th.       LOT [id number] EXP JUL 2024

## 2021-04-26 ENCOUNTER — Telehealth: Payer: Self-pay | Admitting: Orthopaedic Surgery

## 2021-04-26 ENCOUNTER — Ambulatory Visit: Payer: 59

## 2021-04-26 ENCOUNTER — Other Ambulatory Visit: Payer: Self-pay

## 2021-04-26 ENCOUNTER — Ambulatory Visit (INDEPENDENT_AMBULATORY_CARE_PROVIDER_SITE_OTHER): Payer: 59 | Admitting: Physician Assistant

## 2021-04-26 VITALS — BP 149/90 | HR 70

## 2021-04-26 VITALS — BP 153/86 | HR 61

## 2021-04-26 DIAGNOSIS — F902 Attention-deficit hyperactivity disorder, combined type: Secondary | ICD-10-CM | POA: Diagnosis not present

## 2021-04-26 DIAGNOSIS — F429 Obsessive-compulsive disorder, unspecified: Secondary | ICD-10-CM | POA: Diagnosis not present

## 2021-04-26 DIAGNOSIS — F411 Generalized anxiety disorder: Secondary | ICD-10-CM

## 2021-04-26 DIAGNOSIS — F339 Major depressive disorder, recurrent, unspecified: Secondary | ICD-10-CM

## 2021-04-26 NOTE — Telephone Encounter (Signed)
Pt called requesting a call back from one of you ladies. Pt states he was billed for an error in coding. Please call this pt and help him figure out what happened when the office visit was coded. Pt phone number is [phone].

## 2021-04-27 ENCOUNTER — Other Ambulatory Visit: Payer: Self-pay

## 2021-04-27 MED ORDER — SPRAVATO (84 MG DOSE) 28 MG/DEVICE NA SOPK: 84.0000 mg | PACK | NASAL | 2 refills | Status: DC

## 2021-04-27 NOTE — Telephone Encounter (Signed)
I called patient. He states that he had an injection with Dr. Alvester Morin on 09/23/2019 which was sent to his insurance and paid with no problem. He then had another injection on 10/07/2019 (exactly the same) which was sent to insurance and paid. He then had insurance send him back a denial on that second injection. Per patient, they are stating that it was coded incorrectly and he has been receiving a bill for $1200 since. He states that he has been trying to get this corrected with no results. Can you help or tell me where I should send him?

## 2021-04-29 NOTE — Progress Notes (Signed)
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 Spravato is delivered locally by [facility] and stored at [employer] office in a safe behind another locked door, due to being a schedule CIII medication. Pt taken to treatment room. No complaints voiced prior to starting treatment. Initial vital signs assessed at 2:00 PM 149/90, 70. Pt asked to blow his nose and recline his chair to 45 degree angle to begin his treatment. Pt given 1 of the 28 mg dose nasal sprays self administered and nurse observed, with 5 minutes in between all 3 doses until completed. Pt brings head phones and an eye mask, the lights are turned down for comfort. Pt does fall asleep within 30 minutes. Pt's vital signs assessed at 40 minutes, 2:40 PM, 158/96, 68. Pt resting/sleeping until end of treatment then [name] PA-C comes in to discuss his progress and treatment. Discharge vital signs assessed at 4:00 PM, 153/86, 61. Pt was observed a total 120 minutes per FDA/REMS requirements, his symptoms of dissociation and sleepiness are all resolved prior to his discharge. No medications given during his treatment for any symptoms. Nursing assessed pt a total of 45 minutes. Pt will not be scheduled next week, it is Thanksgiving and with his schedule he's not able to make it in. He is scheduled the following Thursday, December 1st, at 2 pm.       LOT [id number] EXP JUL 2024

## 2021-05-01 ENCOUNTER — Ambulatory Visit: Payer: 59 | Admitting: Physician Assistant

## 2021-05-06 ENCOUNTER — Encounter: Payer: Self-pay | Admitting: Physician Assistant

## 2021-05-06 NOTE — Progress Notes (Signed)
[facility]  Patient ID: Albert [name],  MRN: [id number]  PCP: Esperanza Richters, PA-C  Date of Evaluation: 04/26/2021 Time spent: 120 minutes  Chief Complaint:  Chief Complaint   Depression; Anxiety; Other     HISTORY/CURRENT STATUS: HPI For Spravato treatment.   Spravato is still helping with depression. He is able to enjoy things that he was not able to do prior to starting on the Spravato.  Energy and motivation are good most of the time.  He is sleeping well.  Work is going fine.  He does report seasonal affective disorder, it happens every year at this time.  No suicidal or homicidal thoughts.  Continues to have symptoms of OCD but they are not nearly as severe as they used to be.  He feels that the Luvox is working very well.  It helps enough without causing unwanted side effects.  He does still experience anxiety at times but not bad and he rarely takes the Xanax.  States that attention is good without easy distractibility.  Able to focus on things and finish tasks to completion.   Patient denies increased energy with decreased need for sleep, no increased talkativeness, no racing thoughts, no impulsivity or risky behaviors, no increased spending, no increased libido, no grandiosity, no increased irritability or anger, and no hallucinations.  Review of Systems  Constitutional: Negative.   HENT: Negative.    Eyes: Negative.   Respiratory: Negative.    Cardiovascular: Negative.   Gastrointestinal: Negative.   Genitourinary: Negative.   Musculoskeletal: Negative.   Skin: Negative.   Neurological: Negative.   Endo/Heme/Allergies: Negative.   Psychiatric/Behavioral:         See HPI.    Individual Medical History/ Review of Systems: Changes? :No   Past medications for mental health diagnoses include: Luvox, Lunesta, Xanax, Wellbutrin, Risperdal, Prozac, Ambien, Deplin, Abilify, naltrexone, Adderall, Spravato, Trintellix was helpful for depression but not OCD  so it was discontinued.  Allergies: Sulfamethoxazole and Sulfonamide derivatives  Current Medications:  Current Outpatient Medications:    Esketamine HCl, 84 MG Dose, (SPRAVATO, 84 MG DOSE,) 28 MG/DEVICE SOPK, Place 84 mg into the nose once a week., Disp: 3 each, Rfl: 2   fluvoxaMINE (LUVOX) 50 MG tablet, Take 1 tablet (50 mg total) by mouth at bedtime. (Patient taking differently: Take 100 mg by mouth at bedtime.), Disp: 90 tablet, Rfl: 0   telmisartan-hydrochlorothiazide (MICARDIS HCT) 80-12.5 MG tablet, Take 1 tablet by mouth daily., Disp: 90 tablet, Rfl: 1   testosterone cypionate (DEPO-TESTOSTERONE) 100 MG/ML injection, Inject 0.3 mLs (30 mg total) into the muscle once a week. Discard vial 28 days after first use., Disp: 10 mL, Rfl: 0   Colchicine (MITIGARE) 0.6 MG CAPS, Take 1 capsule by mouth 2 (two) times daily as needed. (Patient not taking: Reported on 03/15/2021), Disp: 40 capsule, Rfl: 0   ezetimibe (ZETIA) 10 MG tablet, Take 1 tablet (10 mg total) by mouth daily., Disp: 90 tablet, Rfl: 3 Medication Side Effects: none    Family Medical/ Social History: Changes? No  MENTAL HEALTH EXAM:  Before Spravato-b/p 149/90, pulse 70 40 minutes after administration of Spravato-BP 158/96, pulse 68 2 hours after administration of Spravato-BP 153/86, pulse 61  Blood pressure (!) 149/90, pulse 70.There is no height or weight on file to calculate BMI.  General Appearance: Casual and Well Groomed  Eye Contact:  Good  Speech:  Clear and Coherent and Normal Rate  Volume:  Normal  Mood:  Euthymic  Affect:  Appropriate  and Congruent  Thought Process:  Goal Directed and Descriptions of Associations: Circumstantial  Orientation:  Full (Time, Place, and Person)  Thought Content: Logical   Suicidal Thoughts:  No  Homicidal Thoughts:  No  Memory:  WNL  Judgement:  Good  Insight:  Good  Psychomotor Activity:  Normal  Concentration:  Concentration: Good and Attention Span: Good  Recall:  Good   Fund of Knowledge: Good  Language: Good  Assets:  Desire for Improvement Financial Resources/Insurance Housing Physical Health Social Support Transportation Vocational/Educational  ADL's:  Intact  Cognition: WNL  Prognosis:  Good   He tolerated the Spravato well today.  No headaches, nausea or vomiting, blurred vision, slurred speech, or any other neurologic complaints.  DIAGNOSES:    ICD-10-CM   1. 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 Obsessive-compulsive disorder, unspecified type  F42.9     3. Generalized anxiety disorder  F41.1     4. Attention deficit hyperactivity disorder (ADHD), combined type  F90.2          Receiving Psychotherapy: No    RECOMMENDATIONS:  PDMP was reviewed.  Spravato last filled 04/18/2021.  Testosterone filled 03/19/2021. Total time of observation was 120 minutes including face to face time during this encounter, time spent before and after the visit in records review, medical decision making, counseling pertinent to today's visit, and charting.  He is still doing well on the Spravato.  Continue weekly treatments.  Because next week is Thanksgiving we will skip that week per his request.   Continue Luvox 100 mg, 1 p.o. nightly for OCD and depression.   Continue Spravato 84 mg weekly.  Continue Ativan 0.5 mg, 1 p.o. twice daily as needed.  He has an old prescription and does not take it often. Return in 2 weeks.  [name], PA-C

## 2021-05-07 NOTE — Telephone Encounter (Signed)
noted 

## 2021-05-10 ENCOUNTER — Other Ambulatory Visit: Payer: Self-pay

## 2021-05-10 ENCOUNTER — Ambulatory Visit (INDEPENDENT_AMBULATORY_CARE_PROVIDER_SITE_OTHER): Payer: 59 | Admitting: Physician Assistant

## 2021-05-10 ENCOUNTER — Ambulatory Visit: Payer: 59

## 2021-05-10 ENCOUNTER — Encounter: Payer: Self-pay | Admitting: Physician Assistant

## 2021-05-10 VITALS — BP 144/79 | HR 70

## 2021-05-10 VITALS — BP 141/84 | HR 65

## 2021-05-10 DIAGNOSIS — F338 Other recurrent depressive disorders: Secondary | ICD-10-CM

## 2021-05-10 DIAGNOSIS — F902 Attention-deficit hyperactivity disorder, combined type: Secondary | ICD-10-CM

## 2021-05-10 DIAGNOSIS — F411 Generalized anxiety disorder: Secondary | ICD-10-CM | POA: Diagnosis not present

## 2021-05-10 DIAGNOSIS — F429 Obsessive-compulsive disorder, unspecified: Secondary | ICD-10-CM | POA: Diagnosis not present

## 2021-05-10 DIAGNOSIS — F339 Major depressive disorder, recurrent, unspecified: Secondary | ICD-10-CM | POA: Diagnosis not present

## 2021-05-10 DIAGNOSIS — G4700 Insomnia, unspecified: Secondary | ICD-10-CM

## 2021-05-10 NOTE — Progress Notes (Signed)
[facility]  Patient ID: Albert [name],  MRN: [id number]  PCP: Esperanza Richters, PA-C  Date of Evaluation: 05/10/2021 Time spent: 120 minutes  Chief Complaint:  Chief Complaint   Depression; Anxiety; Other; Follow-up     HISTORY/CURRENT STATUS: HPI For Spravato treatment.   Continues to respond well to Spravato weekly treatments.  Skipped last week due to Thanksgiving. No extreme worsening of depression, although mood was lower at the start of this second week.   Patient denies loss of interest in usual activities and is able to enjoy things.  Denies decreased energy or motivation.  Appetite has not changed.  No extreme sadness, tearfulness, or feelings of hopelessness.  Denies any changes in concentration, making decisions or remembering things. Sleeps well. Work is going well and not missing days.  Denies suicidal or homicidal thoughts.  OCD symptoms are managed well. Luvox still helps. Having some sexual SE from it, but tolerable. Not a lot of generalized anxiety and no complaints of PA.   Patient denies increased energy with decreased need for sleep, no increased talkativeness, no racing thoughts, no impulsivity or risky behaviors, no increased spending, no increased libido, no grandiosity, no increased irritability or anger, no paranoia, and no hallucinations.  Review of Systems  Constitutional: Negative.   HENT: Negative.    Eyes: Negative.   Respiratory: Negative.    Cardiovascular: Negative.   Gastrointestinal: Negative.   Genitourinary: Negative.   Musculoskeletal: Negative.   Skin: Negative.   Neurological: Negative.   Endo/Heme/Allergies: Negative.   Psychiatric/Behavioral:         See HPI.    Individual Medical History/ Review of Systems: Changes? :No   Past medications for mental health diagnoses include: Luvox, Lunesta, Xanax, Wellbutrin, Risperdal, Prozac, Ambien, Deplin, Abilify, naltrexone, Adderall, Spravato, Trintellix was helpful for  depression but not OCD so it was discontinued.  Allergies: Sulfamethoxazole and Sulfonamide derivatives  Current Medications:  Current Outpatient Medications:    Esketamine HCl, 84 MG Dose, (SPRAVATO, 84 MG DOSE,) 28 MG/DEVICE SOPK, Place 84 mg into the nose once a week., Disp: 3 each, Rfl: 2   ezetimibe (ZETIA) 10 MG tablet, Take 1 tablet (10 mg total) by mouth daily., Disp: 90 tablet, Rfl: 3   fluvoxaMINE (LUVOX) 50 MG tablet, Take 1 tablet (50 mg total) by mouth at bedtime. (Patient taking differently: Take 100 mg by mouth at bedtime.), Disp: 90 tablet, Rfl: 0   telmisartan-hydrochlorothiazide (MICARDIS HCT) 80-12.5 MG tablet, Take 1 tablet by mouth daily., Disp: 90 tablet, Rfl: 1   testosterone cypionate (DEPO-TESTOSTERONE) 100 MG/ML injection, Inject 0.3 mLs (30 mg total) into the muscle once a week. Discard vial 28 days after first use., Disp: 10 mL, Rfl: 0   Colchicine (MITIGARE) 0.6 MG CAPS, Take 1 capsule by mouth 2 (two) times daily as needed. (Patient not taking: Reported on 03/15/2021), Disp: 40 capsule, Rfl: 0 Medication Side Effects: sexual dysfunction    Family Medical/ Social History: Changes? No  MENTAL HEALTH EXAM:  Before Spravato-BP 144/79 Pulse 70 40 minutes after administration of Spravato-BP 143/83, pulse 66 2 hours after administration of Spravato-BP 141/84 Pulse 65  Blood pressure (!) 144/79, pulse 70.There is no height or weight on file to calculate BMI.  General Appearance: Casual and Well Groomed  Eye Contact:  Good  Speech:  Clear and Coherent and Normal Rate  Volume:  Normal  Mood:  Euthymic  Affect:  Congruent  Thought Process:  Goal Directed and Descriptions of Associations: Circumstantial  Orientation:  Full (Time, Place, and Person)  Thought Content: Logical   Suicidal Thoughts:  No  Homicidal Thoughts:  No  Memory:  WNL  Judgement:  Good  Insight:  Good  Psychomotor Activity:  Normal  Concentration:  Concentration: Good and Attention Span: Good   Recall:  Good  Fund of Knowledge: Good  Language: Good  Assets:  Desire for Improvement  ADL's:  Intact  Cognition: WNL  Prognosis:  Good   Tolerated Spravato well. Denied headache, nausea or vomiting, blurred vision, slurred speech, or any other neurologic complaints.  DIAGNOSES:    ICD-10-CM   1. 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 Obsessive-compulsive disorder, unspecified type  F42.9     3. Generalized anxiety disorder  F41.1     4. Seasonal affective disorder (HCC)  F33.8     5. Insomnia, unspecified type  G47.00     6. Attention deficit hyperactivity disorder (ADHD), combined type  F90.2        Receiving Psychotherapy: No    RECOMMENDATIONS:  PDMP was reviewed.  Spravato last filled 05/09/2021. Total time of observation was 120 minutes including face to face time during this encounter, time spent before and after the visit in records review, medical decision making, counseling pertinent to today's visit, and charting.  He's doing well so no changes will be made.  Continue Luvox 100 mg, 1 p.o. nightly for OCD and depression.   Continue Spravato 84 mg weekly.  Continue Ativan 0.5 mg, 1 p.o. twice daily as needed.  He has an old prescription and does not take it often. Return in 1 week.  [name], PA-C

## 2021-05-10 NOTE — Progress Notes (Signed)
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 Spravato is delivered locally by [facility] and stored at [employer] office in a safe behind another locked door, due to being a schedule CIII medication. Pt taken to treatment room. No complaints voiced prior to starting treatment. Pt did not have treatment last week due to his schedule with Thanksgiving holiday.  Initial vital signs assessed at 2:05 PM 144/79, 70. Pt asked to blow his nose and recline his chair to 45 degree angle to begin his treatment. Pt given 1 of the 28 mg dose nasal sprays self administered and nurse observed, with 5 minutes in between all 3 doses until completed. Pt brings head phones and an eye mask, the lights are turned down for comfort. Pt does fall asleep within 30 minutes. Pt's vital signs assessed at 40 minutes, 2:45 PM, 143/83, 66. Pt resting/sleeping until end of treatment then [name] PA-C comes in to discuss his progress and treatment. Discharge vital signs assessed at 4:05 PM, 141/84, 65. Pt was observed a total 120 minutes per FDA/REMS requirements, his symptoms of dissociation and sleepiness are all resolved prior to his discharge. No medications given during his treatment for any symptoms. Nursing assessed pt a total of 45 minutes. He is scheduled next Thursday, December 8th, at 2 pm.       LOT [id number] EXP FEB 2024

## 2021-05-13 ENCOUNTER — Encounter: Payer: Self-pay | Admitting: Physician Assistant

## 2021-05-17 ENCOUNTER — Ambulatory Visit: Payer: 59

## 2021-05-17 ENCOUNTER — Ambulatory Visit (INDEPENDENT_AMBULATORY_CARE_PROVIDER_SITE_OTHER): Payer: 59 | Admitting: Physician Assistant

## 2021-05-17 ENCOUNTER — Other Ambulatory Visit: Payer: Self-pay

## 2021-05-17 VITALS — BP 147/88 | HR 65

## 2021-05-17 VITALS — BP 125/67 | HR 73

## 2021-05-17 DIAGNOSIS — F429 Obsessive-compulsive disorder, unspecified: Secondary | ICD-10-CM | POA: Diagnosis not present

## 2021-05-17 DIAGNOSIS — F339 Major depressive disorder, recurrent, unspecified: Secondary | ICD-10-CM | POA: Diagnosis not present

## 2021-05-17 DIAGNOSIS — F338 Other recurrent depressive disorders: Secondary | ICD-10-CM

## 2021-05-17 DIAGNOSIS — F411 Generalized anxiety disorder: Secondary | ICD-10-CM

## 2021-05-17 MED ORDER — FLUVOXAMINE MALEATE 100 MG PO TABS: 100.0000 mg | ORAL_TABLET | Freq: Every day | ORAL | 1 refills | Status: DC

## 2021-05-17 NOTE — Progress Notes (Signed)
[facility]  Patient ID: Albert [name],  MRN: [id number]  PCP: Esperanza Richters, PA-C  Date of Evaluation: 05/17/2021 Time spent:40 minutes  Chief Complaint:  Chief Complaint   Depression; Other     HISTORY/CURRENT STATUS: HPI For Spravato treatment.   Continues to respond well to Spravato weekly treatments.  He is feeling the effects of seasonal affective disorder, is using his therapy lamp which helps some.  The depression is not affecting his job or family life but he just feels more "blue" than he did in the summer.  States that this way every year.  Feels that the Spravato is helping at not be so bad though.  Denies decreased energy or motivation.  Appetite has not changed.  No tearfulness or feelings of hopelessness.  Denies any changes in concentration or memory.  Denies suicidal or homicidal thoughts.  OCD symptoms are managed well. Luvox still helps. Having some sexual SE from it, but tolerable. Not a lot of generalized anxiety and no complaints of PA.   Patient denies increased energy with decreased need for sleep, no increased talkativeness, no racing thoughts, no impulsivity or risky behaviors, no increased spending, no increased libido, no grandiosity, no increased irritability or anger, no paranoia, and no hallucinations.  Review of Systems  Constitutional: Negative.   HENT: Negative.    Eyes: Negative.   Respiratory: Negative.    Cardiovascular: Negative.   Gastrointestinal: Negative.   Genitourinary: Negative.   Musculoskeletal: Negative.   Skin: Negative.   Neurological: Negative.   Endo/Heme/Allergies: Negative.   Psychiatric/Behavioral:         See HPI.    Individual Medical History/ Review of Systems: Changes? :No   Past medications for mental health diagnoses include: Luvox, Lunesta, Xanax, Wellbutrin, Risperdal, Prozac, Ambien, Deplin, Abilify, naltrexone, Adderall, Spravato, Trintellix was helpful for depression but not OCD so it was  discontinued.  Allergies: Sulfamethoxazole and Sulfonamide derivatives  Current Medications:  Current Outpatient Medications:    fluvoxaMINE (LUVOX) 100 MG tablet, Take 1 tablet (100 mg total) by mouth at bedtime., Disp: 90 tablet, Rfl: 1   Colchicine (MITIGARE) 0.6 MG CAPS, Take 1 capsule by mouth 2 (two) times daily as needed. (Patient not taking: Reported on 03/15/2021), Disp: 40 capsule, Rfl: 0   Esketamine HCl, 84 MG Dose, (SPRAVATO, 84 MG DOSE,) 28 MG/DEVICE SOPK, Place 84 mg into the nose once a week., Disp: 3 each, Rfl: 2   ezetimibe (ZETIA) 10 MG tablet, Take 1 tablet (10 mg total) by mouth daily., Disp: 90 tablet, Rfl: 3   telmisartan-hydrochlorothiazide (MICARDIS HCT) 80-12.5 MG tablet, Take 1 tablet by mouth daily., Disp: 90 tablet, Rfl: 1   testosterone cypionate (DEPO-TESTOSTERONE) 100 MG/ML injection, Inject 0.3 mLs (30 mg total) into the muscle once a week. Discard vial 28 days after first use., Disp: 10 mL, Rfl: 0 Medication Side Effects: sexual dysfunction    Family Medical/ Social History: Changes? No  MENTAL HEALTH EXAM  Before Spravato-BP 125/67 Pulse 73 40 minutes after administration of Spravato-BP 158/97 pulse 62 2 hours after administration of Spravato-BP 147/84 Pulse 65  Blood pressure 125/67, pulse 73.There is no height or weight on file to calculate BMI.  General Appearance: Casual and Well Groomed  Eye Contact:  Good  Speech:  Clear and Coherent and Normal Rate  Volume:  Normal  Mood:  Euthymic  Affect:  Congruent  Thought Process:  Goal Directed and Descriptions of Associations: Circumstantial  Orientation:  Full (Time, Place, and Person)  Thought  Content: Logical   Suicidal Thoughts:  No  Homicidal Thoughts:  No  Memory:  WNL  Judgement:  Good  Insight:  Good  Psychomotor Activity:  Normal  Concentration:  Concentration: Good and Attention Span: Good  Recall:  Good  Fund of Knowledge: Good  Language: Good  Assets:  Desire for Improvement   ADL's:  Intact  Cognition: WNL  Prognosis:  Good   Tolerated Spravato well. Denied headache, nausea or vomiting, blurred vision, slurred speech, or any other neurologic complaints.  DIAGNOSES:    ICD-10-CM   1. 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 Obsessive-compulsive disorder, unspecified type  F42.9     3. Generalized anxiety disorder  F41.1     4. Seasonal affective disorder (HCC)  F33.8         Receiving Psychotherapy: No    RECOMMENDATIONS:  PDMP was reviewed.  Spravato last filled 05/15/21 Total time of observation was 40 minutes including face to face time during this encounter, time spent before and after the visit in records review, medical decision making, counseling pertinent to today's visit, and charting.  He's doing well so no changes will be made.  Continue Luvox 100 mg, 1 p.o. nightly for OCD and depression.   Continue Spravato 84 mg weekly.  Continue Ativan 0.5 mg, 1 p.o. twice daily as needed.  He has an old prescription and does not take it often. Return in 1 week.  [name], PA-C

## 2021-05-18 NOTE — Progress Notes (Signed)
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 Spravato is delivered locally by [facility] and stored at [employer] office in a safe behind another locked door, due to being a schedule CIII medication. Pt taken to treatment room. No complaints voiced prior to starting treatment. Initial vital signs assessed at 2:00 PM 125/67, 73. Pt asked to blow his nose and recline his chair to 45 degree angle to begin his treatment. Pt given 1 of the 28 mg dose nasal sprays self administered and nurse observed, with 5 minutes in between all 3 doses until completed. Pt brings head phones and an eye mask, the lights are turned down for comfort. Pt does fall asleep within 30 minutes. Pt's vital signs assessed at 40 minutes, 2:40 PM, 158/97, 62. Pt resting/sleeping until end of treatment then [name] PA-C comes in to discuss his progress and treatment. Discharge vital signs assessed at 3:58 PM, 147/88, 65. Pt was observed a total 120 minutes per FDA/REMS requirements, his symptoms of dissociation and sleepiness are all resolved prior to his discharge. No medications given during his treatment for any symptoms. Nursing assessed pt a total of 45 minutes. He is scheduled next Thursday, December 15th, at 2 pm.       LOT [id number] EXP FEB 2024

## 2021-05-24 ENCOUNTER — Ambulatory Visit (INDEPENDENT_AMBULATORY_CARE_PROVIDER_SITE_OTHER): Payer: 59 | Admitting: Physician Assistant

## 2021-05-24 ENCOUNTER — Other Ambulatory Visit: Payer: Self-pay

## 2021-05-24 ENCOUNTER — Encounter: Payer: Self-pay | Admitting: Physician Assistant

## 2021-05-24 ENCOUNTER — Ambulatory Visit: Payer: 59

## 2021-05-24 VITALS — BP 138/88 | HR 68

## 2021-05-24 DIAGNOSIS — F411 Generalized anxiety disorder: Secondary | ICD-10-CM | POA: Diagnosis not present

## 2021-05-24 DIAGNOSIS — F429 Obsessive-compulsive disorder, unspecified: Secondary | ICD-10-CM

## 2021-05-24 DIAGNOSIS — F331 Major depressive disorder, recurrent, moderate: Secondary | ICD-10-CM

## 2021-05-24 DIAGNOSIS — F338 Other recurrent depressive disorders: Secondary | ICD-10-CM

## 2021-05-24 DIAGNOSIS — F339 Major depressive disorder, recurrent, unspecified: Secondary | ICD-10-CM | POA: Diagnosis not present

## 2021-05-24 DIAGNOSIS — F902 Attention-deficit hyperactivity disorder, combined type: Secondary | ICD-10-CM

## 2021-05-24 NOTE — Progress Notes (Signed)
[facility]  Patient ID: Albert [name],  MRN: [id number]  PCP: [name], PA-C  Date of Evaluation: 05/24/2021 Time spent:40 minutes  Chief Complaint:  Chief Complaint   Depression; Other      HISTORY/CURRENT STATUS: HPI For Spravato treatment.   Vernis is doing well with Spravato treatments.  He is still experiencing seasonal affective disorder but knows that it would be much worse if he was not on the Spravato.  He does use his mood therapy lamp which helps some.  He is able to enjoy things.  Energy and motivation are fair to good depending on his circumstances.  He is not missing any work though.  He is not isolating.  Does not cry easily.  Appetite is normal and weight is stable.  ADLs are within normal limits.  No suicidal or homicidal thoughts.  He still has obsessions occasionally but nothing like they were when he was off the Luvox.  No compulsions or rituals that keep him from his routine of the work or things going on at home.  Generalized anxiety is stable.  He has Ativan but rarely uses it.  No panic attacks are reported.  Review of Systems  Constitutional: Negative.   HENT: Negative.    Eyes: Negative.   Respiratory: Negative.    Cardiovascular: Negative.   Gastrointestinal: Negative.   Genitourinary: Negative.   Musculoskeletal: Negative.   Skin: Negative.   Neurological: Negative.   Endo/Heme/Allergies: Negative.   Psychiatric/Behavioral:         See HPI   Individual Medical History/ Review of Systems: Changes? :No   Past medications for mental health diagnoses include: Luvox, Lunesta, Xanax, Wellbutrin, Risperdal, Prozac, Ambien, Deplin, Abilify, naltrexone, Adderall, Spravato, Trintellix was helpful for depression but not OCD so it was discontinued.  Allergies: Sulfamethoxazole and Sulfonamide derivatives  Current Medications:  Current Outpatient Medications:    Esketamine HCl, 84 MG Dose, (SPRAVATO, 84 MG DOSE,) 28 MG/DEVICE SOPK,  Place 84 mg into the nose once a week., Disp: 3 each, Rfl: 2   ezetimibe (ZETIA) 10 MG tablet, Take 1 tablet (10 mg total) by mouth daily., Disp: 90 tablet, Rfl: 3   fluvoxaMINE (LUVOX) 100 MG tablet, Take 1 tablet (100 mg total) by mouth at bedtime., Disp: 90 tablet, Rfl: 1   telmisartan-hydrochlorothiazide (MICARDIS HCT) 80-12.5 MG tablet, Take 1 tablet by mouth daily., Disp: 90 tablet, Rfl: 1   testosterone cypionate (DEPO-TESTOSTERONE) 100 MG/ML injection, Inject 0.3 mLs (30 mg total) into the muscle once a week. Discard vial 28 days after first use., Disp: 10 mL, Rfl: 0   Colchicine (MITIGARE) 0.6 MG CAPS, Take 1 capsule by mouth 2 (two) times daily as needed. (Patient not taking: Reported on 03/15/2021), Disp: 40 capsule, Rfl: 0 Medication Side Effects: sexual dysfunction  tolerable and the Luvox helps the OCD so much, he doesn't want to change it.   Family Medical/ Social History: Changes? No  MENTAL HEALTH EXAM:  Before Spravato-131/87, pulse 76 40 minutes after administration of Spravato-128/80, pulse 62 2 hours after administration of Spravato-138/88, pulse 68  There were no vitals taken for this visit.There is no height or weight on file to calculate BMI.  General Appearance: Casual and Well Groomed  Eye Contact:  Good  Speech:  Clear and Coherent and Normal Rate  Volume:  Normal  Mood:  Euthymic  Affect:  Congruent  Thought Process:  Goal Directed and Descriptions of Associations: Circumstantial  Orientation:  Full (Time, Place, and Person)  Thought  Content: Logical   Suicidal Thoughts:  No  Homicidal Thoughts:  No  Memory:  WNL  Judgement:  Good  Insight:  Good  Psychomotor Activity:  Normal  Concentration:  Concentration: Good and Attention Span: Good  Recall:  Good  Fund of Knowledge: Good  Language: Good  Assets:  Desire for Improvement  ADL's:  Intact  Cognition: WNL  Prognosis:  Good    DIAGNOSES:    ICD-10-CM   1. Recurrent major depression resistant to  treatment ([place])  F33.9     2. Obsessive-compulsive disorder, unspecified type  F42.9     3. Generalized anxiety disorder  F41.1     4. Seasonal affective disorder ([place])  F33.8     5. Attention deficit hyperactivity disorder (ADHD), combined type  F90.2       Receiving Psychotherapy: No    RECOMMENDATIONS:  PDMP was reviewed.  Spravato last filled 05/22/2021. I provided 40 minutes of face to face time during this encounter, including time spent before and after the visit in records review, medical decision making, counseling pertinent to today's visit, and charting.  Patient was administered Spravato 84 mg intranasally today.  The patient experienced the typical dissociation which gradually resolved over the 2-hour period of observation.  There were no complications.  Specifically the patient did not have nausea or vomiting or headache.  Blood pressures remained within normal ranges at the 40-minute and 2-hour follow-up intervals.  By the time the 2-hour observation period was met the patient was alert and oriented and able to exit without assistance.  Patient feels the Spravato administration is helpful for the treatment resistant depression and would like to continue the treatment.  See nursing note for further details. Continue Luvox 100 mg, 1 p.o. nightly for OCD and depression.   Continue Spravato 84 mg weekly.  Continue Ativan 0.5 mg, 1 p.o. twice daily as needed.  He has an old prescription and does not take it often. Return in 2 weeks.  (He prefers to skip next week due to the Christmas holidays.)  [name], PA-C

## 2021-05-27 ENCOUNTER — Encounter: Payer: Self-pay | Admitting: Physician Assistant

## 2021-05-28 NOTE — Progress Notes (Signed)
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 Spravato is delivered locally by [facility] and stored at [employer] office in a safe behind another locked door, due to being a schedule CIII medication. Pt taken to treatment room. No complaints voiced prior to starting treatment. Initial vital signs assessed at 2:15 PM 131/87, 76. Pt asked to blow his nose and recline his chair to 45 degree angle to begin his treatment. Pt given 1 of the 28 mg dose nasal sprays self administered and nurse observed, with 5 minutes in between all 3 doses until completed. Pt brings head phones and an eye mask, the lights are turned down for comfort. Pt does fall asleep within 30 minutes. Pt's vital signs assessed at 40 minutes, 2:55 PM, 128/80, 62. Pt resting/sleeping until end of treatment then [name] PA-C comes in to discuss his progress and treatment. Discharge vital signs assessed at 4:15 PM, 138/88, 68. Pt was observed a total 120 minutes per FDA/REMS requirements, his symptoms of dissociation and sleepiness are all resolved prior to his discharge. No medications given during his treatment for any symptoms. Nursing assessed pt a total of 45 minutes. Pt requests to miss next week the 22nd due to holidays and will plan on following Thursday, 29 th.        LOT [id number] EXP FEB 2024

## 2021-06-01 ENCOUNTER — Other Ambulatory Visit: Payer: Self-pay | Admitting: Medical

## 2021-06-07 ENCOUNTER — Telehealth (INDEPENDENT_AMBULATORY_CARE_PROVIDER_SITE_OTHER): Payer: 59 | Admitting: Family Medicine

## 2021-06-07 DIAGNOSIS — U071 COVID-19: Secondary | ICD-10-CM

## 2021-06-07 MED ORDER — BENZONATATE 100 MG PO CAPS: ORAL_CAPSULE | ORAL | 0 refills | Status: DC

## 2021-06-07 NOTE — Patient Instructions (Addendum)
°  HOME CARE TIPS:  -I sent the medication(s) we discussed to your pharmacy: Meds ordered this encounter  Medications   benzonatate (TESSALON PERLES) 100 MG capsule    Sig: 1-2 capsules up to twice daily as needed for cough    Dispense:  30 capsule    Refill:  0     -can use tylenol if needed for fevers, aches and pains per instructions  -can use nasal saline a few times per day if you have nasal congestion  -stay hydrated, drink plenty of fluids and eat small healthy meals - avoid dairy  -can take 1000 IU ( ) Vit D3 and 100-500 mg of Vit C daily per instructions  -stay home while sick, except to seek medical care. If you have COVID19, you will likely be contagious for 7-10 days. Flu or Influenza is likely contagious for about 7 days. Other respiratory viral infections remain contagious for 5-10+ days depending on the virus and many other factors. Wear a good mask that fits snugly (such as N95 or KN95) if around others to reduce the risk of transmission.  It was nice to meet you today, and I really hope you are feeling better soon. I help [place] out with telemedicine visits on Tuesdays and Thursdays and am happy to help if you need a follow up virtual visit on those days. Otherwise, if you have any concerns or questions following this visit please schedule a follow up visit with your Primary Care doctor or seek care at a local urgent care clinic to avoid delays in care.    Seek in person care or schedule a follow up video visit promptly if your symptoms worsen, new concerns arise or you are not improving with treatment. Call 911 and/or seek emergency care if your symptoms are severe or life threatening.

## 2021-06-07 NOTE — Progress Notes (Signed)
Virtual Visit via Telephone Note  I connected with Albert [name] on 06/07/21 at  5:40 PM EST by telephone and verified that I am speaking with the correct person using two identifiers.   I discussed the limitations of performing an evaluation and management service by telephone and requested permission for a phone visit. The patient expressed understanding and agreed to proceed.  Location patient:  [place] Location provider: work or home office Participants present for the call: patient, provider Patient did not have a visit with me in the prior 7 days to address this/these issue(s).   History of Present Illness:  Acute telemedicine visit for Covid19: -Onset: 3 days ago, tested positive for covid -Symptoms include: nasal, body aches, chills, fevers, cough -Denies:CP, SOB, NVD -Pertinent past medical history: see below -Pertinent medication allergies:  Allergies  Allergen Reactions   Sulfamethoxazole     Other reaction(s): Arthralgias (intolerance)   Sulfonamide Derivatives     REACTION: Rash Stiff joints  -COVID-19 vaccine status: had [employer] and 2 booster Immunization History  Administered Date(s) Administered   Td 02/14/2005      Past Medical History:  Diagnosis Date   Attention deficit hyperactivity disorder (ADHD) 06/19/2016   Closed nondisplaced fracture of neck of fifth metacarpal bone of right hand 07/10/2016   CONTACT DERMATITIS&OTHER ECZEMA DUE UNSPEC CAUSE 10/06/2009   Qualifier: Diagnosis of  By: Yetta Barre MD, Bernadene Bell.    Depression    DEPRESSION 02/20/2009   Qualifier: Diagnosis of  By: Yetta Barre MD, Bernadene Bell.    Hyperlipidemia    NECK PAIN, ACUTE 09/13/2009   Qualifier: Diagnosis of  By: Yetta Barre MD, Bernadene Bell.    Thrombosed external hemorrhoid 10/06/2015   Symptoms and exam consistent with thrombosed external hemorrhoid. Patient declines incise and drain at this time. Treat conservatively with hydrocortisone rectal cream and Tylenol 3 as needed for discomfort. Recommend sitz  bath. Follow-up if symptoms worsen or do not improve or would like to seek interventional procedure.    TOBACCO USE, QUIT 03/16/2009   Qualifier: Diagnosis of  By: Tora Perches      Current Outpatient Medications on File Prior to Visit  Medication Sig Dispense Refill   Esketamine HCl, 84 MG Dose, (SPRAVATO, 84 MG DOSE,) 28 MG/DEVICE SOPK Place 84 mg into the nose once a week. 3 each 2   fluvoxaMINE (LUVOX) 100 MG tablet Take 1 tablet (100 mg total) by mouth at bedtime. 90 tablet 1   telmisartan-hydrochlorothiazide (MICARDIS HCT) 80-12.5 MG tablet TAKE ONE TABLET BY MOUTH DAILY 30 tablet 2   testosterone cypionate (DEPO-TESTOSTERONE) 100 MG/ML injection Inject 0.3 mLs (30 mg total) into the muscle once a week. Discard vial 28 days after first use. 10 mL 0   No current facility-administered medications on file prior to visit.    Observations/Objective: Patient sounds cheerful and well on the phone. I do not appreciate any SOB. Speech and thought processing are grossly intact. Patient reported vitals:  Assessment and Plan:  COVID-19   Discussed treatment options and risk of drug interactions, ideal treatment window, potential complications, isolation and precautions for COVID-19.  After lengthy discussion, the patient opted against antiviral treatment at this time. The patient did want a prescription for cough, Tessalon Rx sent.  Other symptomatic care measures summarized in patient instructions. Work/School slipped offered: declined Advised to seek prompt virtual visit or in person care if worsening, new symptoms arise, or if is not improving with treatment as expected per our conversation of expected course.  Discussed options for follow up care. I discussed the assessment and treatment plan with the patient. The patient was provided an opportunity to ask questions and all were answered. The patient agreed with the plan and demonstrated an understanding of the instructions.    Follow Up  Instructions:  I did not refer this patient for an OV with me in the next 24 hours for this/these issue(s).  I discussed the assessment and treatment plan with the patient. The patient was provided an opportunity to ask questions and all were answered. The patient agreed with the plan and demonstrated an understanding of the instructions.   I spent 14 minutes on the date of this visit in the care of this patient. See summary of tasks completed to properly care for this patient in the detailed notes above which also included counseling of above, review of PMH, medications, allergies, evaluation of the patient and ordering and/or  instructing patient on testing and care options.     Terressa Koyanagi, DO

## 2021-06-08 ENCOUNTER — Telehealth: Payer: Self-pay | Admitting: Physician Assistant

## 2021-06-08 ENCOUNTER — Other Ambulatory Visit: Payer: Self-pay

## 2021-06-08 MED ORDER — FLUVOXAMINE MALEATE 100 MG PO TABS: 100.0000 mg | ORAL_TABLET | Freq: Every day | ORAL | 1 refills | Status: DC

## 2021-06-08 NOTE — Telephone Encounter (Signed)
Pt called reporting  [facility] is not as effective. Different manufacturer than HT. Requesting Rx for Fluvoxamine to [facility]  [facility] [employer] only. [facility]s [facility]. Pt unsure correct name of brand needed. He knows the [facility] Rx bottle reads  (ANI).  Questions call Pt @ [phone]

## 2021-06-08 NOTE — Telephone Encounter (Signed)
Noted  

## 2021-06-08 NOTE — Telephone Encounter (Signed)
FYI, I will change his Fluvoxamine as requested.

## 2021-06-09 ENCOUNTER — Other Ambulatory Visit: Payer: Self-pay | Admitting: Physician Assistant

## 2021-06-12 ENCOUNTER — Other Ambulatory Visit: Payer: Self-pay

## 2021-06-12 ENCOUNTER — Telehealth: Payer: Self-pay | Admitting: Physician Assistant

## 2021-06-12 MED ORDER — FLUVOXAMINE MALEATE 100 MG PO TABS: 100.0000 mg | ORAL_TABLET | Freq: Every day | ORAL | 0 refills | Status: DC

## 2021-06-12 NOTE — Telephone Encounter (Signed)
Rx sent 

## 2021-06-12 NOTE — Telephone Encounter (Signed)
Pt called to say that the generic Luvox he gets from [employer] does not work as well. He can [facility] [address]. Can we please [facility] so he can [facility] it up?

## 2021-06-14 ENCOUNTER — Ambulatory Visit: Payer: 59

## 2021-06-14 ENCOUNTER — Other Ambulatory Visit: Payer: Self-pay

## 2021-06-14 ENCOUNTER — Ambulatory Visit (INDEPENDENT_AMBULATORY_CARE_PROVIDER_SITE_OTHER): Payer: 59 | Admitting: Physician Assistant

## 2021-06-14 ENCOUNTER — Encounter: Payer: Self-pay | Admitting: Physician Assistant

## 2021-06-14 VITALS — BP 150/91 | HR 71

## 2021-06-14 DIAGNOSIS — F339 Major depressive disorder, recurrent, unspecified: Secondary | ICD-10-CM | POA: Diagnosis not present

## 2021-06-14 DIAGNOSIS — F338 Other recurrent depressive disorders: Secondary | ICD-10-CM

## 2021-06-14 DIAGNOSIS — F429 Obsessive-compulsive disorder, unspecified: Secondary | ICD-10-CM | POA: Diagnosis not present

## 2021-06-14 MED ORDER — FLUVOXAMINE MALEATE 100 MG PO TABS: 100.0000 mg | ORAL_TABLET | Freq: Every day | ORAL | 3 refills | Status: DC

## 2021-06-14 NOTE — Progress Notes (Signed)
[facility]  Patient ID: Albert [name],  MRN: [id number]  PCP: [name], PA-C  Date of Evaluation: 06/14/2021 Time spent:40 minutes  Chief Complaint:  Chief Complaint   Depression; Other      HISTORY/CURRENT STATUS: HPI For Spravato treatment.   Albert [name] is doing well with Spravato treatments.  He still has SAD, but not really worse. Thinks it would be a lot worse w/o Spravato.  He is able to enjoy things, not wanting to do as much.   Energy and motivation are fair to good for the most part.  He is not missing any work.  Does not cry easily.  Appetite is normal and weight is stable.  ADLs are within normal limits.  No suicidal or homicidal thoughts.  When he got the last Luvox filled, it must have been a different generic brand. Didn't work as well, to the point he even felt withdrawals like dizziness, like he wasn't taking it at all. After getting back on the usual generic brand, it's been better. He still has obsessions occasionally.  No compulsions or rituals that keep him from his routine of the work or things going on at home.  Generalized anxiety is stable.  He has Ativan but rarely uses it.  No panic attacks are reported.  Review of Systems  Constitutional: Negative.   HENT: Negative.    Eyes: Negative.   Respiratory: Negative.    Cardiovascular: Negative.   Gastrointestinal: Negative.   Genitourinary: Negative.   Musculoskeletal: Negative.   Skin: Negative.   Neurological: Negative.   Endo/Heme/Allergies: Negative.   Psychiatric/Behavioral:         See HPI   Individual Medical History/ Review of Systems: Changes? :No   Past medications for mental health diagnoses include: Luvox, Lunesta, Xanax, Wellbutrin, Risperdal, Prozac, Ambien, Deplin, Abilify, naltrexone, Adderall, Spravato, Trintellix was helpful for depression but not OCD so it was discontinued.  Allergies: Sulfamethoxazole and Sulfonamide derivatives  Current Medications:  Current  Outpatient Medications:    benzonatate (TESSALON PERLES) 100 MG capsule, 1-2 capsules up to twice daily as needed for cough (Patient not taking: Reported on 06/21/2021), Disp: 30 capsule, Rfl: 0   Esketamine HCl, 84 MG Dose, (SPRAVATO, 84 MG DOSE,) 28 MG/DEVICE SOPK, Place 84 mg into the nose once a week., Disp: 3 each, Rfl: 2   fluvoxaMINE (LUVOX) 100 MG tablet, Take 1 tablet (100 mg total) by mouth at bedtime., Disp: 90 tablet, Rfl: 3   prazosin (MINIPRESS) 1 MG capsule, 1 po qhs for 5 days, and then increase to 2 po qhs for 5 days, then increase to 3 po qhs IF dreams are still occurring., Disp: 90 capsule, Rfl: 1   telmisartan-hydrochlorothiazide (MICARDIS HCT) 80-12.5 MG tablet, TAKE ONE TABLET BY MOUTH DAILY, Disp: 30 tablet, Rfl: 2   testosterone cypionate (DEPO-TESTOSTERONE) 100 MG/ML injection, Inject 0.3 mLs (30 mg total) into the muscle once a week. Discard vial 28 days after first use., Disp: 10 mL, Rfl: 0 Medication Side Effects: sexual dysfunction  tolerable and the Luvox helps the OCD so much, he doesn't want to change it.   Family Medical/ Social History: Changes? No  MENTAL HEALTH EXAM:  There were no vitals taken for this visit.There is no height or weight on file to calculate BMI.  General Appearance: Casual and Well Groomed  Eye Contact:  Good  Speech:  Clear and Coherent and Normal Rate  Volume:  Normal  Mood:  Euthymic  Affect:  Congruent  Thought Process:  Goal Directed and Descriptions of Associations: Circumstantial  Orientation:  Full (Time, Place, and Person)  Thought Content: Logical   Suicidal Thoughts:  No  Homicidal Thoughts:  No  Memory:  WNL  Judgement:  Good  Insight:  Good  Psychomotor Activity:  Normal  Concentration:  Concentration: Good and Attention Span: Good  Recall:  Good  Fund of Knowledge: Good  Language: Good  Assets:  Desire for Improvement Financial Resources/Insurance Housing Physical Health Social Support Transportation  ADL's:   Intact  Cognition: WNL  Prognosis:  Good    DIAGNOSES:    ICD-10-CM   1. 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 Obsessive-compulsive disorder, unspecified type  F42.9     3. Seasonal affective disorder ([place])  F33.8        Receiving Psychotherapy: No    RECOMMENDATIONS:  PDMP was reviewed.   I provided 40 minutes of face to face time during this encounter, including time spent before and after the visit in records review, medical decision making, counseling pertinent to today's visit, and charting.  Patient was administered Spravato 84 mg intranasally today.  The patient experienced the typical dissociation which gradually resolved over the 2-hour period of observation.  There were no complications.  Specifically the patient did not have nausea or vomiting or headache.  Blood pressures remained within normal ranges at the 40-minute and 2-hour follow-up intervals.  By the time the 2-hour observation period was met the patient was alert and oriented and able to exit without assistance.  Patient feels the Spravato administration is helpful for the treatment resistant depression and would like to continue the treatment.  See nursing note for further details. Discussed getting the Spravato twice/wk for the SAD, but he prefers to leave it as is for now.  Continue Luvox 100 mg, 1 p.o. nightly for OCD and depression.   Continue Spravato 84 mg weekly.  Continue Ativan 0.5 mg, 1 p.o. twice daily as needed.  He has an old prescription and does not take it often. Return in 1 week.   [name], PA-C

## 2021-06-19 ENCOUNTER — Other Ambulatory Visit: Payer: Self-pay

## 2021-06-20 MED ORDER — SPRAVATO (84 MG DOSE) 28 MG/DEVICE NA SOPK: 84.0000 mg | PACK | NASAL | 2 refills | Status: DC

## 2021-06-21 ENCOUNTER — Other Ambulatory Visit: Payer: Self-pay

## 2021-06-21 ENCOUNTER — Ambulatory Visit (INDEPENDENT_AMBULATORY_CARE_PROVIDER_SITE_OTHER): Payer: 59 | Admitting: Physician Assistant

## 2021-06-21 ENCOUNTER — Ambulatory Visit: Payer: 59

## 2021-06-21 ENCOUNTER — Encounter: Payer: Self-pay | Admitting: Physician Assistant

## 2021-06-21 ENCOUNTER — Ambulatory Visit: Payer: 59 | Admitting: Adult Health

## 2021-06-21 VITALS — BP 118/75 | HR 75

## 2021-06-21 DIAGNOSIS — F329 Major depressive disorder, single episode, unspecified: Secondary | ICD-10-CM | POA: Diagnosis not present

## 2021-06-21 DIAGNOSIS — F411 Generalized anxiety disorder: Secondary | ICD-10-CM

## 2021-06-21 DIAGNOSIS — F429 Obsessive-compulsive disorder, unspecified: Secondary | ICD-10-CM | POA: Diagnosis not present

## 2021-06-21 DIAGNOSIS — F338 Other recurrent depressive disorders: Secondary | ICD-10-CM

## 2021-06-21 DIAGNOSIS — F902 Attention-deficit hyperactivity disorder, combined type: Secondary | ICD-10-CM

## 2021-06-21 DIAGNOSIS — F19981 Other psychoactive substance use, unspecified with psychoactive substance-induced sexual dysfunction: Secondary | ICD-10-CM

## 2021-06-21 MED ORDER — PRAZOSIN HCL 1 MG PO CAPS: ORAL_CAPSULE | ORAL | 1 refills | Status: DC

## 2021-06-21 NOTE — Progress Notes (Signed)
[facility] ° °Patient ID: Albert [name],  °MRN: [id number] ° °PCP: Saguier, Edward, PA-C ° °Date of Evaluation: 06/21/2021 °Time spent:40 minutes ° °Chief Complaint:  °Chief Complaint   °Depression; Anxiety; Follow-up °  ° ° ° °HISTORY/CURRENT STATUS: °HPI For Spravato treatment.  ° °Albert [name] is still tolerating the Spravato treatments well.  He again reports more depression for the past month or so, feels that he is in the middle of the seasonal affective depression as per his history.  He is able to enjoy things somewhat but does feel more blue.  He is able to work, energy and motivation are fair to good depending on the day or what he needs to do.  He is not crying easily.  Personal hygiene is normal.  ADLs are normal.  No suicidal or homicidal thoughts are reported. ° °OCD is well controlled with the Luvox.  He has been back on this specific generic brand for about a month now.  He can definitely tell a positive difference with the obsessions.  The other generic brand did not work as well.  The only problem is increased vivid and strange dreams.  He was not experiencing that before.  They are not necessarily nightmares but can be disturbing because he does not feel rested when he gets up.  He feels that his mind has been working all night long.  He has taken prazosin in the past, unsure of efficacy though.  He does not remember any side effects. ° °Review of Systems  °Constitutional: Negative.   °HENT: Negative.    °Eyes: Negative.   °Respiratory: Negative.    °Cardiovascular: Negative.   °Gastrointestinal: Negative.   °Genitourinary: Negative.   °Musculoskeletal: Negative.   °Skin: Negative.   °Neurological: Negative.   °Endo/Heme/Allergies: Negative.   °Psychiatric/Behavioral:    °     See HPI.  ° °Individual Medical History/ Review of Systems: Changes? :No  ° °Past medications for mental health diagnoses include: °Luvox, Lunesta, Xanax, Wellbutrin, Risperdal, Prozac, Ambien, Deplin, Abilify,  naltrexone, Adderall, Spravato, Trintellix was helpful for depression but not OCD so it was discontinued. ° °Allergies: Sulfamethoxazole and Sulfonamide derivatives ° °Current Medications:  °Current Outpatient Medications:  °  Esketamine HCl, 84 MG Dose, (SPRAVATO, 84 MG DOSE,) 28 MG/DEVICE SOPK, Place 84 mg into the nose once a week., Disp: 3 each, Rfl: 2 °  fluvoxaMINE (LUVOX) 100 MG tablet, Take 1 tablet (100 mg total) by mouth at bedtime., Disp: 90 tablet, Rfl: 3 °  telmisartan-hydrochlorothiazide (MICARDIS HCT) 80-12.5 MG tablet, TAKE ONE TABLET BY MOUTH DAILY, Disp: 30 tablet, Rfl: 2 °  testosterone cypionate (DEPO-TESTOSTERONE) 100 MG/ML injection, Inject 0.3 mLs (30 mg total) into the muscle once a week. Discard vial 28 days after first use., Disp: 10 mL, Rfl: 0 °  benzonatate (TESSALON PERLES) 100 MG capsule, 1-2 capsules up to twice daily as needed for cough (Patient not taking: Reported on 06/21/2021), Disp: 30 capsule, Rfl: 0 °  prazosin (MINIPRESS) 1 MG capsule, 1 po qhs for 5 days, and then increase to 2 po qhs for 5 days, then increase to 3 po qhs IF dreams are still occurring., Disp: 90 capsule, Rfl: 1 °Medication Side Effects: sexual dysfunction  tolerable and the Luvox helps the OCD so much, he doesn't want to change it.  ° °Family Medical/ Social History: Changes? No ° °MENTAL HEALTH EXAM: ° °Before Spravato-BP 118/75, pulse 75 °40 minutes after administration of Spravato-BP 141/83, pulse 78 °2 hours after administration of Spravato-BP   122/77, pulse 72 ° °Blood pressure 118/75, pulse 75.There is no height or weight on file to calculate BMI.  °General Appearance: Casual and Well Groomed  °Eye Contact:  Good  °Speech:  Clear and Coherent and Normal Rate  °Volume:  Normal  °Mood:  Depressed  °Affect:  Congruent and Depressed  °Thought Process:  Goal Directed and Descriptions of Associations: Circumstantial  °Orientation:  Full (Time, Place, and Person)  °Thought Content: Logical   °Suicidal Thoughts:   No  °Homicidal Thoughts:  No  °Memory:  WNL  °Judgement:  Good  °Insight:  Good  °Psychomotor Activity:  Normal  °Concentration:  Concentration: Good and Attention Span: Good  °Recall:  Good  °Fund of Knowledge: Good  °Language: Good  °Assets:  Desire for Improvement  °ADL's:  Intact  °Cognition: WNL  °Prognosis:  Good  ° ° °DIAGNOSES:  °  ICD-10-CM   °1. Treatment-resistant depression  F32.9   °  °2. Obsessive-compulsive disorder, unspecified type  F42.9   °  °3. Generalized anxiety disorder  F41.1   °  °4. Seasonal affective disorder (H[facility])  F33.8   °  °5. Attention deficit hyperactivity disorder (ADHD), combined type  F90.2   °  °6. 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  Spravato last filled 06/20/2021. °I provided 40 minutes of face to face time during this encounter, including time spent before and after the visit in records review, medical decision making, counseling pertinent to today's visit, and charting.  °Patient was administered Spravato 84 mg intranasally today.  The patient experienced the typical dissociation which gradually resolved over the 2-hour period of observation.  There were no complications.  Specifically the patient did not have nausea or vomiting or headache.  Blood pressures remained within normal ranges at the 40-minute and 2-hour follow-up intervals.  By the time the 2-hour observation period was met the patient was alert and oriented and able to exit without assistance.  Patient feels the Spravato administration is helpful for the treatment resistant depression and would like to continue the treatment.  We discussed increasing the Spravato treatments to twice weekly to help with this seasonal depression.  He wants to continue just once a week because he really does not have time to have the treatments twice a week.  For now no changes will be made in the schedule. °We discussed  restarting the prazosin to help with the dreams.  He would like to try that.  Benefits, risks and side effects were discussed and he accepts. °Restart prazosin 1 mg, 1 p.o. nightly for 3 nights if no better increase to 2 p.o. nightly. °Continue Luvox 100 mg, 1 p.o. nightly for OCD and depression.   °Continue Spravato 84 mg weekly.  °Continue Ativan 0.5 mg, 1 p.o. twice daily as needed.  He has an old prescription and does not take it often. °Return in 1 week. ° °Teresa Hurst, PA-C    °

## 2021-06-21 NOTE — Progress Notes (Signed)
Patient arrived for his weekly Spravato treatment of 84 mg (3 of the 28 mg) nasal sprays for his treatment resistant depression. Spravato is delivered locally by [facility] and stored at [employer] office in a safe behind another locked door, due to being a schedule CIII medication. Patient received treatment in the conference room today. He was pleasant and answered all questions asked. Vital signs assessed prior to treatment at 2:03: BP 118/75, pulse 75; assessed at 40 minutes intol treatment at : BP 141/83, pulse 78; and at the end of treatment at 4:04: BP 122/77, pulse 72.  Patient blew his nose and a reclined his chair to 45 degree angle to begin his treatment. Patient was given one of the 28 mg dose nasal sprays self-administered and CMA observed, with 5 minutes in between all 3 doses until completed.  Patient listened to music on his phone and had on eye blinders. He had water, requested a blanket, and wanted the lights out during treatment. Patient resting/sleeping until end of treatment then [name] PA-C came in to discuss his progress and treatment. Patient was observed a total 120 minutes per FDA/REMS requirements, his symptoms of dissociation and sleepiness are all resolved prior to his discharge. No medications given during his treatment for any symptoms. CMA assessed patient a total of 45 minutes. Patient did comment that he felt the treatment was more intense today, though we had talked prior to treatment starting about the effects being more intense at times.      LOT [id number] EXP FEB 2024

## 2021-06-22 ENCOUNTER — Encounter: Payer: Self-pay | Admitting: Physician Assistant

## 2021-06-24 NOTE — Progress Notes (Signed)
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 Spravato is delivered locally by [facility] and stored at [employer] office in a safe behind another locked door, due to being a schedule CIII medication. Pt taken to treatment room. No complaints voiced prior to starting treatment. Initial vital signs assessed at 2:00 PM 136/70, 72. Pt asked to blow his nose and recline his chair to 45 degree angle to begin his treatment. Pt given 1 of the 28 mg dose nasal sprays self administered and nurse observed, with 5 minutes in between all 3 doses until completed. Pt brings head phones and an eye mask, the lights are turned down for comfort. Pt does fall asleep within 30 minutes. Pt's vital signs assessed at 40 minutes, 2:40 PM, 145/89, 68. Pt resting/sleeping until end of treatment then [name] PA-C comes in to discuss his progress and treatment. Discharge vital signs assessed at 4:00 PM, 150/91, 71. Pt was observed a total 120 minutes per FDA/REMS requirements, his symptoms of dissociation and sleepiness are all resolved prior to his discharge. No medications given during his treatment for any symptoms. Nursing assessed pt a total of 45 minutes. pt scheduled next Thursday, January 12th at 2 pm.      LOT [id number] EXP FEB 2024

## 2021-06-28 ENCOUNTER — Ambulatory Visit: Payer: 59 | Admitting: Adult Health

## 2021-06-28 ENCOUNTER — Ambulatory Visit: Payer: 59

## 2021-06-28 ENCOUNTER — Other Ambulatory Visit: Payer: Self-pay

## 2021-06-28 ENCOUNTER — Ambulatory Visit (INDEPENDENT_AMBULATORY_CARE_PROVIDER_SITE_OTHER): Payer: 59 | Admitting: Physician Assistant

## 2021-06-28 VITALS — BP 136/75 | HR 72

## 2021-06-28 VITALS — BP 139/86 | HR 64

## 2021-06-28 DIAGNOSIS — F329 Major depressive disorder, single episode, unspecified: Secondary | ICD-10-CM

## 2021-06-28 DIAGNOSIS — F338 Other recurrent depressive disorders: Secondary | ICD-10-CM

## 2021-06-28 DIAGNOSIS — F429 Obsessive-compulsive disorder, unspecified: Secondary | ICD-10-CM | POA: Diagnosis not present

## 2021-06-28 DIAGNOSIS — F902 Attention-deficit hyperactivity disorder, combined type: Secondary | ICD-10-CM

## 2021-06-28 DIAGNOSIS — F411 Generalized anxiety disorder: Secondary | ICD-10-CM | POA: Diagnosis not present

## 2021-06-28 MED ORDER — ADZENYS XR-ODT 6.3 MG PO TBED: 6.3000 mg | EXTENDED_RELEASE_TABLET | Freq: Every day | ORAL | 0 refills | Status: DC

## 2021-06-28 NOTE — Progress Notes (Signed)
[facility]  Patient ID: Albert [name],  MRN: [id number]  PCP: [name], PA-C  Date of Evaluation: 06/28/2021 Time spent:40 minutes  Chief Complaint:  Chief Complaint   Depression; Other     HISTORY/CURRENT STATUS: HPI For Spravato treatment.   He continues to tolerate the Spravato well.  Reports seasonal depression is still present, about the same.  Able to work and function at home and with the family, just does not feel as upbeat.  Seems more tired, has difficulty concentrating and getting things done.  He also has a diagnosis of ADHD, with stimulant use in the past.  He is not crying easily.  Personal hygiene is normal.  No suicidal or homicidal thoughts.  OCD is well controlled with the Luvox.  Still has sexual side effects secondary to the medication but states it is manageable and Luvox is necessary to control his symptoms.  The vivid dreams have improved since adding the prazosin last week.  He does not always take 2 mg at night, sometimes just 1.  No nightmares.  Review of Systems  Constitutional:  Positive for malaise/fatigue.  HENT: Negative.    Eyes: Negative.   Respiratory: Negative.    Cardiovascular: Negative.   Gastrointestinal: Negative.   Genitourinary: Negative.   Musculoskeletal: Negative.   Skin: Negative.   Neurological: Negative.   Endo/Heme/Allergies: Negative.   Psychiatric/Behavioral:         See HPI    Individual Medical History/ Review of Systems: Changes? :No   Past medications for mental health diagnoses include: Luvox, Lunesta, Xanax, Wellbutrin, Risperdal, Prozac, Ambien, Deplin, Abilify, naltrexone, Adderall, Spravato, Trintellix was helpful for depression but not OCD so it was discontinued.  Allergies: Sulfamethoxazole and Sulfonamide derivatives  Current Medications:  Current Outpatient Medications:    Amphetamine ER (ADZENYS XR-ODT) 6.3 MG TBED, Take 6.3 mg by mouth daily after breakfast., Disp: 30 tablet, Rfl:  0   benzonatate (TESSALON PERLES) 100 MG capsule, 1-2 capsules up to twice daily as needed for cough (Patient not taking: Reported on 06/21/2021), Disp: 30 capsule, Rfl: 0   Esketamine HCl, 84 MG Dose, (SPRAVATO, 84 MG DOSE,) 28 MG/DEVICE SOPK, Place 84 mg into the nose once a week., Disp: 3 each, Rfl: 2   fluvoxaMINE (LUVOX) 100 MG tablet, Take 1 tablet (100 mg total) by mouth at bedtime., Disp: 90 tablet, Rfl: 3   prazosin (MINIPRESS) 1 MG capsule, 1 po qhs for 5 days, and then increase to 2 po qhs for 5 days, then increase to 3 po qhs IF dreams are still occurring., Disp: 90 capsule, Rfl: 1   telmisartan-hydrochlorothiazide (MICARDIS HCT) 80-12.5 MG tablet, TAKE ONE TABLET BY MOUTH DAILY, Disp: 30 tablet, Rfl: 2   testosterone cypionate (DEPO-TESTOSTERONE) 100 MG/ML injection, Inject 0.3 mLs (30 mg total) into the muscle once a week. Discard vial 28 days after first use., Disp: 10 mL, Rfl: 0 Medication Side Effects: sexual dysfunction  tolerable   Family Medical/ Social History: Changes? No  MENTAL HEALTH EXAM:  Before Spravato-136/75, pulse 72 40 minutes after administration of Spravato-134/71, pulse 70 2 hours after administration of Spravato-139/86, pulse 64  Blood pressure 136/75, pulse 72.There is no height or weight on file to calculate BMI.  General Appearance: Casual and Well Groomed  Eye Contact:  Good  Speech:  Clear and Coherent and Normal Rate  Volume:  Normal  Mood:  Depressed  Affect:  Congruent  Thought Process:  Goal Directed and Descriptions of Associations: Circumstantial  Orientation:  Full (Time, Place, and Person)  Thought Content: Logical   Suicidal Thoughts:  No  Homicidal Thoughts:  No  Memory:  WNL  Judgement:  Good  Insight:  Good  Psychomotor Activity:  Normal  Concentration:  Concentration: Good and Attention Span: Good  Recall:  Good  Fund of Knowledge: Good  Language: Good  Assets:  Desire for Improvement  ADL's:  Intact  Cognition: WNL   Prognosis:  Good   See MADRS.  DIAGNOSES:    ICD-10-CM   1. Treatment-resistant depression  F32.9     2. Obsessive-compulsive disorder, unspecified type  F42.9     3. Attention deficit hyperactivity disorder (ADHD), combined type  F90.2     4. Generalized anxiety disorder  F41.1     5. Seasonal affective disorder ([place])  F33.8         Receiving Psychotherapy: No    RECOMMENDATIONS:  PDMP was reviewed.  Spravato last filled 06/27/2021. I provided 40 minutes of face to face time during this encounter, including time spent before and after the visit in records review, medical decision making, counseling pertinent to today's visit, and charting.   Patient was administered Spravato 84 mg intranasally today.  The patient experienced the typical dissociation which gradually resolved over the 2-hour period of observation.  There were no complications.  Specifically the patient did not have nausea or vomiting or headache.  Blood pressures remained within normal ranges at the 40-minute and 2-hour follow-up intervals.  By the time the 2-hour observation period was met the patient was alert and oriented and able to exit without assistance.  Patient feels the Spravato administration is helpful for the treatment resistant depression and would like to continue the treatment.  See nursing note for further details.   I am glad he is doing better as far as the dreams go.  No change in prazosin. We discussed getting him back on a stimulant, short term.  This will help with focus and concentration as well as lift his mood and give him more energy.  If he does not need it once spring is here then he can stop it.  He took [employer] ER in the past which was effective and would like to restart that.  Restart adzenys ER 6.3 mg, 1 p.o. every morning after breakfast. Continue prazosin 1 mg, 1-2 p.o. nightly for dreams. Continue Luvox 100 mg, 1 p.o. nightly for OCD and depression.   Continue Spravato 84 mg  weekly.  Continue Ativan 0.5 mg, 1 p.o. twice daily as needed.  He has an old prescription and rarely takes.   Return in 1 week.  [name], PA-C

## 2021-06-30 ENCOUNTER — Encounter: Payer: Self-pay | Admitting: Physician Assistant

## 2021-07-02 ENCOUNTER — Encounter: Payer: Self-pay | Admitting: Physician Assistant

## 2021-07-02 NOTE — Progress Notes (Signed)
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 Spravato is delivered locally by [facility] and stored at [employer] office in a safe behind another locked door, due to being a schedule CIII medication. Pt taken to treatment room. No complaints voiced prior to starting treatment. Initial vital signs assessed at 2:005 PM 136/75, 72. Pt asked to blow his nose and recline his chair to 45 degree angle to begin his treatment. Pt given 1 of the 28 mg dose nasal sprays self administered and nurse observed, with 5 minutes in between all 3 doses until completed. Pt brings head phones and an eye mask, the lights are turned down for comfort. Pt does fall asleep within 30 minutes. Pt's vital signs assessed at 40 minutes, 2:45 PM, 134/71, 70. Pt resting/sleeping until end of treatment then [name] PA-C comes in to discuss his progress and treatment. Discharge vital signs assessed at 4:05 PM, 139/86, 64. Pt was observed a total 120 minutes per FDA/REMS requirements, his symptoms of dissociation and sleepiness are all resolved prior to his discharge. No medications given during his treatment for any symptoms. Nursing assessed pt a total of 45 minutes. pt scheduled next Thursday, January 26th at 2 pm.      LOT [id number] EXP FEB 2024

## 2021-07-05 ENCOUNTER — Ambulatory Visit: Payer: 59 | Admitting: Physician Assistant

## 2021-07-10 ENCOUNTER — Ambulatory Visit: Payer: 59

## 2021-07-10 ENCOUNTER — Other Ambulatory Visit: Payer: Self-pay

## 2021-07-10 ENCOUNTER — Ambulatory Visit (INDEPENDENT_AMBULATORY_CARE_PROVIDER_SITE_OTHER): Payer: 59 | Admitting: Physician Assistant

## 2021-07-10 ENCOUNTER — Encounter: Payer: Self-pay | Admitting: Physician Assistant

## 2021-07-10 VITALS — BP 141/93 | HR 66

## 2021-07-10 VITALS — BP 148/84 | HR 71

## 2021-07-10 DIAGNOSIS — F338 Other recurrent depressive disorders: Secondary | ICD-10-CM

## 2021-07-10 DIAGNOSIS — F429 Obsessive-compulsive disorder, unspecified: Secondary | ICD-10-CM | POA: Diagnosis not present

## 2021-07-10 DIAGNOSIS — F329 Major depressive disorder, single episode, unspecified: Secondary | ICD-10-CM | POA: Diagnosis not present

## 2021-07-10 DIAGNOSIS — F411 Generalized anxiety disorder: Secondary | ICD-10-CM

## 2021-07-10 DIAGNOSIS — F518 Other sleep disorders not due to a substance or known physiological condition: Secondary | ICD-10-CM

## 2021-07-10 DIAGNOSIS — F902 Attention-deficit hyperactivity disorder, combined type: Secondary | ICD-10-CM

## 2021-07-10 MED ORDER — SPRAVATO (84 MG DOSE) 28 MG/DEVICE NA SOPK: 84.0000 mg | PACK | NASAL | 3 refills | Status: DC

## 2021-07-10 NOTE — Progress Notes (Signed)
important  Sensitive Note     NURSE VISIT:   Pt here for his weekly Spravato Treatment of 84 mg (3 of the 28 mg) nasal sprays for his treatment resistant depression. Spravato is delivered locally by [facility] and stored at [employer] office in a safe behind another locked door, due to being a schedule CIII medication. Pt taken to treatment room. No complaints voiced prior to starting treatment. Initial vital signs assessed at 2:40 PM 148/84, 71. Pt asked to blow his nose and recline his chair to 45 degree angle to begin his treatment. Pt given 1 of the 28 mg dose nasal sprays self administered and nurse observed, with 5 minutes in between all 3 doses until completed. Pt brings head phones and an eye mask, the lights are turned down for comfort. Pt does fall asleep within 30 minutes. Pt's vital signs assessed at 40 minutes, 3:20 PM, 148/87, 70. Pt resting/sleeping until end of treatment then [name] PA-C comes in to discuss his progress and treatment. Discharge vital signs assessed at 4:40 PM, 141/93, 66. Pt was observed a total 120 minutes per FDA/REMS requirements, his symptoms of dissociation and sleepiness are all resolved prior to his discharge. No medications given during his treatment for any symptoms. Nursing assessed pt a total of 45 minutes. pt scheduled this Thursday, February 2 nd at 2 pm. after discussing his treatment with Rosey Bath they decided to have pt come again this Thursday.     LOT [id number] EXP JUL 2024

## 2021-07-10 NOTE — Progress Notes (Signed)
[facility]  Patient ID: Albert [name],  MRN: [id number]  PCP: [name], PA-C  Date of Evaluation: 07/10/2021 Time spent:40 minutes  Chief Complaint:  Chief Complaint   Depression; Other      HISTORY/CURRENT STATUS: HPI For Spravato treatment.   He continues to tolerate the Spravato well.  He had to skip last week since he was out of town.  He has really been able to tell that his mood has declined even more due to being about 5 days later than normal taking his treatment.  The seasonal depression is still really bad.  Does not want to do anything, does not have a lot of energy or motivation either but he is working.  ADLs and personal hygiene are both normal.  We have talked about twice weekly Spravato treatments right now just to help get through the wintertime but he again says that it is not feasible timewise for him.  Not crying easily.  Sleeps well, since being on the prazosin is not having such strange dreams.  Denies suicidal or homicidal thoughts.  At the last visit 06/28/2021 we added adzenys ER.  He has a diagnosis of ADHD and has used a stimulant in the past but not for a while.  His focus and ability to stay on task was decreasing since the depression seems to be worsening.  We decided to try another stimulant to see if it would help both problems.  It has helped with the focus but not so much with the depression so far.  No side effects.  OCD is well controlled with the Luvox.  Still has sexual side effects secondary to the medication but states it is manageable and Luvox is necessary.  Generalized anxiety is well controlled for the most part with the Luvox.  Review of Systems  Constitutional:  Positive for malaise/fatigue.  HENT: Negative.    Eyes: Negative.   Respiratory: Negative.    Cardiovascular: Negative.   Gastrointestinal: Negative.   Genitourinary: Negative.   Musculoskeletal: Negative.   Skin: Negative.   Neurological: Negative.    Endo/Heme/Allergies: Negative.   Psychiatric/Behavioral:         See HPI    Individual Medical History/ Review of Systems: Changes? :No   Past medications for mental health diagnoses include: Luvox, Lunesta, Xanax, Wellbutrin, Risperdal, Prozac, Ambien, Deplin, Abilify, naltrexone, Adderall, Spravato, Trintellix was helpful for depression but not OCD so it was discontinued.  Allergies: Sulfamethoxazole and Sulfonamide derivatives  Current Medications:  Current Outpatient Medications:    Amphetamine ER (ADZENYS XR-ODT) 6.3 MG TBED, Take 6.3 mg by mouth daily after breakfast., Disp: 30 tablet, Rfl: 0   Esketamine HCl, 84 MG Dose, (SPRAVATO, 84 MG DOSE,) 28 MG/DEVICE SOPK, Place 84 mg into the nose once a week., Disp: 3 each, Rfl: 3   fluvoxaMINE (LUVOX) 100 MG tablet, Take 1 tablet (100 mg total) by mouth at bedtime., Disp: 90 tablet, Rfl: 3   prazosin (MINIPRESS) 1 MG capsule, 1 po qhs for 5 days, and then increase to 2 po qhs for 5 days, then increase to 3 po qhs IF dreams are still occurring., Disp: 90 capsule, Rfl: 1   telmisartan-hydrochlorothiazide (MICARDIS HCT) 80-12.5 MG tablet, TAKE ONE TABLET BY MOUTH DAILY, Disp: 30 tablet, Rfl: 2   testosterone cypionate (DEPO-TESTOSTERONE) 100 MG/ML injection, Inject 0.3 mLs (30 mg total) into the muscle once a week. Discard vial 28 days after first use., Disp: 10 mL, Rfl: 0   benzonatate (TESSALON PERLES) 100  MG capsule, 1-2 capsules up to twice daily as needed for cough (Patient not taking: Reported on 06/21/2021), Disp: 30 capsule, Rfl: 0 Medication Side Effects: sexual dysfunction  tolerable   Family Medical/ Social History: Changes? No  MENTAL HEALTH EXAM:  Before Spravato-148/84, pulse 71  40 minutes after administration of Spravato-148/87, pulse 70  2 hours after administration of Spravato-141/93, pulse 66  Blood pressure (!) 148/84, pulse 71.There is no height or weight on file to calculate BMI.  General Appearance: Casual and  Well Groomed  Eye Contact:  Good  Speech:  Clear and Coherent and Normal Rate  Volume:  Normal  Mood:  Depressed  Affect:  Congruent  Thought Process:  Goal Directed and Descriptions of Associations: Circumstantial  Orientation:  Full (Time, Place, and Person)  Thought Content: Logical   Suicidal Thoughts:  No  Homicidal Thoughts:  No  Memory:  WNL  Judgement:  Good  Insight:  Good  Psychomotor Activity:  Normal  Concentration:  Concentration: Good and Attention Span: Good  Recall:  Good  Fund of Knowledge: Good  Language: Good  Assets:  Desire for Improvement Financial Resources/Insurance Housing Resilience Transportation Vocational/Educational  ADL's:  Intact  Cognition: WNL  Prognosis:  Good   ECT-MADRS    [place] Visit from 07/10/2021 in [place] Visit from 06/28/2021 in [place] Visit from 04/19/2021 in [place] Visit from 03/15/2021 in [place] Visit from 02/13/2021 in [facility]  MADRS Total Score $[id number]'18 11 5 1 1      '[id number]$ PHQ2-9    Flowsheet Row Telemedicine from 08/30/2020 in [facility]  PHQ-2 Total Score 6  PHQ-9 Total Score 20      Flowsheet Row Telemedicine from 08/30/2020 in [place] Error: Q3, 4, or 5 should not be populated when Q2 is No          DIAGNOSES:    ICD-10-CM   1. Treatment-resistant depression  F32.9     2. Obsessive-compulsive disorder, unspecified type  F42.9     3. Generalized anxiety disorder  F41.1     4. Seasonal affective disorder ([place])  F33.8     5. Abnormal dreams  F51.8     6. Attention deficit hyperactivity disorder (ADHD), combined type  F90.2          Receiving Psychotherapy: No    RECOMMENDATIONS:  PDMP was reviewed.  Spravato last filled 06/27/2021. I provided 40 minutes of face to face time during this encounter,  including time spent before and after the visit in records review, medical decision making, counseling pertinent to today's visit, and charting.   Patient was administered Spravato 84 mg intranasally today.  The patient experienced the typical dissociation which gradually resolved over the 2-hour period of observation.  There were no complications.  Specifically the patient did not have nausea or vomiting or headache.  Blood pressures remained within normal ranges at the 40-minute and 2-hour follow-up intervals.  By the time the 2-hour observation period was met the patient was alert and oriented and able to exit without assistance.  Patient feels the Spravato administration is helpful for the treatment resistant depression and would like to continue the treatment.  See nursing note for further details.   Discussed the worsening depression.  Again I recommend increasing the Spravato treatments to twice weekly for 4 to 6 weeks, depending on how he is feeling.  He again states  that is not feasible due to time constraints.  He asks if he could have another treatment this week because he is feeling so bad right now since missing last week's treatment.  That is definitely appropriate so nurse Dory Larsen, LPN will set that up for 07/12/2020.  Again discussed the Luvox.  He states it has never helped that much for the depression but more for the OCD symptoms.  The higher the dose the more sexual side effects he has so prefers to stay at the current dose.  Continue adzenys ER 6.3 mg, 1 p.o. every morning after breakfast. Continue prazosin 1 mg, 1-2 p.o. nightly for dreams. Continue Luvox 100 mg, 1 p.o. nightly for OCD and depression.   Continue Spravato 84 mg weekly.  Continue Ativan 0.5 mg, 1 p.o. twice daily as needed.  He has an old prescription and rarely takes.   Return in 2 days.  [name], PA-C

## 2021-07-12 ENCOUNTER — Ambulatory Visit (INDEPENDENT_AMBULATORY_CARE_PROVIDER_SITE_OTHER): Payer: 59 | Admitting: Physician Assistant

## 2021-07-12 ENCOUNTER — Other Ambulatory Visit: Payer: Self-pay

## 2021-07-12 ENCOUNTER — Ambulatory Visit: Payer: 59

## 2021-07-12 ENCOUNTER — Encounter: Payer: Self-pay | Admitting: Physician Assistant

## 2021-07-12 VITALS — BP 156/79 | HR 75

## 2021-07-12 VITALS — BP 158/90 | HR 69

## 2021-07-12 DIAGNOSIS — F338 Other recurrent depressive disorders: Secondary | ICD-10-CM | POA: Diagnosis not present

## 2021-07-12 DIAGNOSIS — F19981 Other psychoactive substance use, unspecified with psychoactive substance-induced sexual dysfunction: Secondary | ICD-10-CM

## 2021-07-12 DIAGNOSIS — F902 Attention-deficit hyperactivity disorder, combined type: Secondary | ICD-10-CM

## 2021-07-12 DIAGNOSIS — F329 Major depressive disorder, single episode, unspecified: Secondary | ICD-10-CM | POA: Diagnosis not present

## 2021-07-12 DIAGNOSIS — F429 Obsessive-compulsive disorder, unspecified: Secondary | ICD-10-CM | POA: Diagnosis not present

## 2021-07-12 DIAGNOSIS — F411 Generalized anxiety disorder: Secondary | ICD-10-CM

## 2021-07-12 NOTE — Progress Notes (Signed)
[facility]  Patient ID: Albert [name],  MRN: [id number]  PCP: [name], PA-C  Date of Evaluation: 07/12/2021 Time spent:40 minutes  Chief Complaint:  Chief Complaint   Depression; Other      HISTORY/CURRENT STATUS: HPI For Spravato treatment.   [name]'s last Spravato treatment was 2 days ago.  He has been more depressed due to the season and weather so I recommended that he increase the frequency of Spravato treatments.  He also had to skip last week due to being out of town so he and I both felt like he really should have another treatment this week.  States he is feeling some better since the treatment 2 days ago.  He is still struggling with feeling more down, not wanting to do things as much, although he is able to work without any problems.  It is very draining though.  Personal hygiene and ADLs are normal.  No suicidal or homicidal thoughts.  He stopped the prazosin.  Not needing it now.  He does have occasional weird dreams but not disturbing or too much of a problem.  OCD is well controlled with the Luvox.  Still has sexual side effects secondary to the medication but states it is manageable and Luvox is necessary.  Generalized anxiety is well controlled for the most part with the Luvox.  States that attention is good without easy distractibility.  Able to focus on things and finish tasks to completion.  [facility] ER is helpful.  Review of Systems  Constitutional:  Positive for malaise/fatigue.  HENT: Negative.    Eyes: Negative.   Respiratory: Negative.    Cardiovascular: Negative.   Gastrointestinal: Negative.   Genitourinary: Negative.   Musculoskeletal: Negative.   Skin: Negative.   Neurological: Negative.   Endo/Heme/Allergies: Negative.   Psychiatric/Behavioral:         See HPI    Individual Medical History/ Review of Systems: Changes? :No   Past medications for mental health diagnoses include: Luvox, Lunesta, Xanax, Wellbutrin,  Risperdal, Prozac, Ambien, Deplin, Abilify, naltrexone, Adderall, Spravato, Trintellix was helpful for depression but not OCD so it was discontinued.  Allergies: Sulfamethoxazole and Sulfonamide derivatives  Current Medications:  Current Outpatient Medications:    Amphetamine ER (ADZENYS XR-ODT) 6.3 MG TBED, Take 6.3 mg by mouth daily after breakfast., Disp: 30 tablet, Rfl: 0   Esketamine HCl, 84 MG Dose, (SPRAVATO, 84 MG DOSE,) 28 MG/DEVICE SOPK, Place 84 mg into the nose once a week., Disp: 3 each, Rfl: 3   fluvoxaMINE (LUVOX) 100 MG tablet, Take 1 tablet (100 mg total) by mouth at bedtime., Disp: 90 tablet, Rfl: 3   telmisartan-hydrochlorothiazide (MICARDIS HCT) 80-12.5 MG tablet, TAKE ONE TABLET BY MOUTH DAILY, Disp: 30 tablet, Rfl: 2   benzonatate (TESSALON PERLES) 100 MG capsule, 1-2 capsules up to twice daily as needed for cough (Patient not taking: Reported on 06/21/2021), Disp: 30 capsule, Rfl: 0   prazosin (MINIPRESS) 1 MG capsule, 1 po qhs for 5 days, and then increase to 2 po qhs for 5 days, then increase to 3 po qhs IF dreams are still occurring. (Patient not taking: Reported on 07/12/2021), Disp: 90 capsule, Rfl: 1   testosterone cypionate (DEPO-TESTOSTERONE) 100 MG/ML injection, Inject 0.3 mLs (30 mg total) into the muscle once a week. Discard vial 28 days after first use., Disp: 10 mL, Rfl: 0 Medication Side Effects: sexual dysfunction  tolerable   Family Medical/ Social History: Changes? No  MENTAL HEALTH EXAM:  Before Spravato-156/79, pulse  75 40 minutes after administration of Spravato-BP 174/80, P 70 2 hours after administration of Spravato-158/90, pulse 69  Blood pressure (!) 156/79, pulse 75.There is no height or weight on file to calculate BMI.  General Appearance: Casual and Well Groomed  Eye Contact:  Good  Speech:  Clear and Coherent and Normal Rate  Volume:  Normal  Mood:  Depressed  Affect:  Congruent and Depressed  Thought Process:  Goal Directed and  Descriptions of Associations: Circumstantial  Orientation:  Full (Time, Place, and Person)  Thought Content: Logical   Suicidal Thoughts:  No  Homicidal Thoughts:  No  Memory:  WNL  Judgement:  Good  Insight:  Good  Psychomotor Activity:  Normal  Concentration:  Concentration: Good and Attention Span: Good  Recall:  Good  Fund of Knowledge: Good  Language: Good  Assets:  Desire for Improvement Financial Resources/Insurance Housing Resilience Transportation Vocational/Educational  ADL's:  Intact  Cognition: WNL  Prognosis:  Good   ECT-MADRS    [place] Office Visit from 07/10/2021 in [place] Visit from 06/28/2021 in [place] Visit from 04/19/2021 in [place] Visit from 03/15/2021 in [place] Visit from 02/13/2021 in [facility]  MADRS Total Score _0 PHQ2-9    Flowsheet Row Telemedicine from 08/30/2020 in [facility]  PHQ-2 Total Score 6  PHQ-9 Total Score 20      Flowsheet Row Telemedicine from 08/30/2020 in [place] Error: Q3, 4, or 5 should not be populated when Q2 is No          DIAGNOSES:    ICD-10-CM   1. Treatment-resistant depression  F32.9     2. Obsessive-compulsive disorder, unspecified type  F42.9     3. Generalized anxiety disorder  F41.1     4. 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 Attention deficit hyperactivity disorder (ADHD), combined type  F90.2       Receiving Psychotherapy: No    RECOMMENDATIONS:  PDMP was reviewed.  Spravato last filled 07/06/2021.  Last adzenys ER 06/28/2021.   I provided 40 minutes of face to face time during this encounter, including time spent before and after the visit in records review, medical decision making, counseling pertinent to  today's visit, and charting.   Patient was administered Spravato 84 mg intranasally today.  The patient experienced the typical dissociation which gradually resolved over the 2-hour period of observation.  There were no complications.  Specifically the patient did not have nausea or vomiting or headache.  Blood pressures remained within normal ranges at the 40-minute and 2-hour follow-up intervals.  By the time the 2-hour observation period was met the patient was alert and oriented and able to exit without assistance.  Patient feels the Spravato administration is helpful for the treatment resistant depression and would like to continue the treatment.  See nursing note for further details.   We again discussed him having Spravato treatments twice a week to help with the seasonal affective disorder but at this point he can only take off work long enough for 1 treatment per week.  He will let me know if the depression worsens and we will have to increase the treatments.  Continue adzenys ER 6.3 mg, 1 p.o. every morning after breakfast. He discontinued  prazosin, not needing. Continue Luvox 100 mg, 1 p.o. nightly for OCD and depression.   Continue Spravato 84 mg weekly.  Continue Ativan 0.5 mg, 1 p.o. twice daily as needed.  He has an old prescription and rarely takes.   Return in 1 week.  [name], PA-C

## 2021-07-13 ENCOUNTER — Telehealth: Payer: Self-pay

## 2021-07-13 NOTE — Progress Notes (Signed)
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 Spravato is delivered locally by [facility] and stored at [employer] office in a safe behind another locked door, due to being a schedule CIII medication. Pt taken to treatment room. No complaints voiced prior to starting treatment. Initial vital signs assessed at 2:15 PM 156/79, 75. Pt asked to blow his nose and recline his chair to 45 degree angle to begin his treatment. Pt given 1 of the 28 mg dose nasal sprays self administered and nurse observed, with 5 minutes in between all 3 doses until completed. Pt brings head phones and an eye mask, the lights are turned down for comfort. Pt does fall asleep within 30 minutes. Pt's vital signs assessed at 40 minutes, 2:55 PM, 174/80, 70. Pt resting/sleeping until end of treatment then [name] PA-C comes in to discuss his progress and treatment. Discharge vital signs assessed at 4:40 PM, 158/90, 69. Pt was observed a total 120 minutes per FDA/REMS requirements, his symptoms of dissociation and sleepiness are all resolved prior to his discharge. No medications given during his treatment for any symptoms. Nursing assessed pt a total of 45 minutes. pt scheduled next Thursday, February 9th at 2 pm.      LOT [id number] EXP JUL 2024

## 2021-07-13 NOTE — Telephone Encounter (Signed)
Prior Authorization submitted and approved for SPRAVATO 84 MG (3 of the 28 mg) #12/30 day supply with [facility] effective 07/04/2021-01/01/2022.   ID# [id number]

## 2021-07-14 ENCOUNTER — Encounter: Payer: Self-pay | Admitting: Physician Assistant

## 2021-07-19 ENCOUNTER — Ambulatory Visit (INDEPENDENT_AMBULATORY_CARE_PROVIDER_SITE_OTHER): Payer: 59 | Admitting: Physician Assistant

## 2021-07-19 ENCOUNTER — Other Ambulatory Visit: Payer: Self-pay

## 2021-07-19 ENCOUNTER — Encounter: Payer: Self-pay | Admitting: Physician Assistant

## 2021-07-19 ENCOUNTER — Ambulatory Visit: Payer: 59

## 2021-07-19 VITALS — BP 132/71 | HR 67

## 2021-07-19 VITALS — BP 140/79 | HR 67

## 2021-07-19 DIAGNOSIS — F329 Major depressive disorder, single episode, unspecified: Secondary | ICD-10-CM | POA: Diagnosis not present

## 2021-07-19 DIAGNOSIS — F19981 Other psychoactive substance use, unspecified with psychoactive substance-induced sexual dysfunction: Secondary | ICD-10-CM

## 2021-07-19 DIAGNOSIS — F338 Other recurrent depressive disorders: Secondary | ICD-10-CM

## 2021-07-19 DIAGNOSIS — F902 Attention-deficit hyperactivity disorder, combined type: Secondary | ICD-10-CM

## 2021-07-19 DIAGNOSIS — F411 Generalized anxiety disorder: Secondary | ICD-10-CM

## 2021-07-19 DIAGNOSIS — F429 Obsessive-compulsive disorder, unspecified: Secondary | ICD-10-CM | POA: Diagnosis not present

## 2021-07-19 NOTE — Progress Notes (Signed)
[facility]  Patient ID: Albert [name],  MRN: [id number]  PCP: [name], PA-C  Date of Evaluation: 07/19/2021 Time spent:40 minutes  Chief Complaint:  Chief Complaint   Depression; Other     HISTORY/CURRENT STATUS: HPI For Spravato treatment.   Arlon's last Spravato treatment was 1 week ago. Has been doing pretty well since then. Still has SAD sx, but feels like it might be a little better, knows having Spravato twice a week would probably be best right now, but still not able to do it d/t work. States the low mood usually starts to improve this time of year anyway. Able to enjoy things.  Energy and motivation are low to good, depends on the day.  Appetite has not changed.  No extreme sadness, tearfulness, or feelings of hopelessness.  Denies suicidal or homicidal thoughts.  OCD is well controlled with the Luvox.  Still has sexual side effects secondary to the medication but states it is manageable and Luvox is necessary.  Generalized anxiety is well controlled for the most part with the Luvox.  States that attention is good without easy distractibility.  Able to focus on things and finish tasks to completion.  The [facility] ER is helpful. Doesn't take it daily.  Sleeps pretty good. Still has some weird dreams but not too bad. Not enough to take the Prazosin. No nightmares.  Review of Systems  Constitutional:  Positive for malaise/fatigue.  HENT: Negative.    Eyes: Negative.   Respiratory: Negative.    Cardiovascular: Negative.   Gastrointestinal: Negative.   Genitourinary: Negative.   Musculoskeletal: Negative.   Skin: Negative.   Neurological: Negative.   Endo/Heme/Allergies: Negative.   Psychiatric/Behavioral:         See HPI    Individual Medical History/ Review of Systems: Changes? :No   Past medications for mental health diagnoses include: Luvox, Lunesta, Xanax, Wellbutrin, Risperdal, Prozac, Ambien, Deplin, Abilify, naltrexone, Adderall, Spravato,  Trintellix was helpful for depression but not OCD so it was discontinued.  Allergies: Sulfamethoxazole and Sulfonamide derivatives  Current Medications:  Current Outpatient Medications:    Amphetamine ER (ADZENYS XR-ODT) 6.3 MG TBED, Take 6.3 mg by mouth daily after breakfast., Disp: 30 tablet, Rfl: 0   Esketamine HCl, 84 MG Dose, (SPRAVATO, 84 MG DOSE,) 28 MG/DEVICE SOPK, Place 84 mg into the nose once a week., Disp: 3 each, Rfl: 3   fluvoxaMINE (LUVOX) 100 MG tablet, Take 1 tablet (100 mg total) by mouth at bedtime., Disp: 90 tablet, Rfl: 3   telmisartan-hydrochlorothiazide (MICARDIS HCT) 80-12.5 MG tablet, TAKE ONE TABLET BY MOUTH DAILY, Disp: 30 tablet, Rfl: 2   testosterone cypionate (DEPO-TESTOSTERONE) 100 MG/ML injection, Inject 0.3 mLs (30 mg total) into the muscle once a week. Discard vial 28 days after first use., Disp: 10 mL, Rfl: 0   benzonatate (TESSALON PERLES) 100 MG capsule, 1-2 capsules up to twice daily as needed for cough (Patient not taking: Reported on 06/21/2021), Disp: 30 capsule, Rfl: 0 Medication Side Effects: sexual dysfunction  tolerable   Family Medical/ Social History: Changes? No  MENTAL HEALTH EXAM:   Blood pressure 132/71, pulse 67.There is no height or weight on file to calculate BMI.  General Appearance: Casual and Well Groomed  Eye Contact:  Good  Speech:  Clear and Coherent and Normal Rate  Volume:  Normal  Mood:   doesn't appear as depressed today.  Affect:   brighter countenance  Thought Process:  Goal Directed and Descriptions of Associations: Circumstantial  Orientation:  Full (Time, Place, and Person)  Thought Content: Logical   Suicidal Thoughts:  No  Homicidal Thoughts:  No  Memory:  WNL  Judgement:  Good  Insight:  Good  Psychomotor Activity:  Normal  Concentration:  Concentration: Good and Attention Span: Good  Recall:  Good  Fund of Knowledge: Good  Language: Good  Assets:  Desire for Improvement Financial  Resources/Insurance Housing Resilience Transportation Vocational/Educational  ADL's:  Intact  Cognition: WNL  Prognosis:  Good   ECT-MADRS    [place] [facility] Visit from 07/19/2021 in [place] Visit from 07/10/2021 in [place] Visit from 06/28/2021 in [place] Visit from 04/19/2021 in [place] Visit from 03/15/2021 in [facility]  MADRS Total Score _0 PHQ2-9    Flowsheet Row Telemedicine from 08/30/2020 in [facility]  PHQ-2 Total Score 6  PHQ-9 Total Score 20      Flowsheet Row Telemedicine from 08/30/2020 in [facility]  C-SSRS RISK CATEGORY Error: Q3, 4, or 5 should not be populated when Q2 is No        DIAGNOSES:    ICD-10-CM   1. Treatment-resistant depression  F32.9     2. Obsessive-compulsive disorder, unspecified type  F42.9     3. Seasonal affective disorder ([place])  F33.8     4. 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 Generalized anxiety disorder  F41.1     6. Attention deficit hyperactivity disorder (ADHD), combined type  F90.2        Receiving Psychotherapy: No    RECOMMENDATIONS:  PDMP was reviewed.  Spravato last filled 07/18/2021.  Last adzenys XR 06/28/2021.   I provided 40 minutes of face to face time during this encounter, including time spent before and after the visit in records review, medical decision making, counseling pertinent to today's visit, and charting.   Patient was administered Spravato 84 mg intranasally today.  The patient experienced the typical dissociation which gradually resolved over the 2-hour period of observation.  There were no complications.  Specifically the patient did not have nausea or vomiting or headache.  Blood pressures remained within normal ranges at the 40-minute and 2-hour follow-up intervals.  By the time the  2-hour observation period was met the patient was alert and oriented and able to exit without assistance.  Patient feels the Spravato administration is helpful for the treatment resistant depression and would like to continue the treatment.  See nursing note for further details.   He's doing well overall so no changes will be made. MADRS was better today (after 2 Spravato treatments last week) so I think we're almost through the seasonal depression.  Continue adzenys ER 6.3 mg, 1 p.o. every morning after breakfast. Takes it prn. Continue Luvox 100 mg, 1 p.o. nightly for OCD and depression.   Continue Spravato 84 mg weekly.  Continue Ativan 0.5 mg, 1 p.o. twice daily as needed.  He has an old prescription and rarely takes.   Return in 1 week.  [name], PA-C

## 2021-07-20 NOTE — Progress Notes (Signed)
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 Spravato is delivered locally by [facility] and stored at [employer] office in a safe behind another locked door, due to being a schedule CIII medication. Pt taken to treatment room. No complaints voiced prior to starting treatment. Initial vital signs assessed at 2:30 PM 132/71, 67. Pt asked to blow his nose and recline his chair to 45 degree angle to begin his treatment. Pt given 1 of the 28 mg dose nasal sprays self administered and nurse observed, with 5 minutes in between all 3 doses until completed. Pt brings head phones and an eye mask, the lights are turned down for comfort. Pt does fall asleep within 30 minutes. Pt's vital signs assessed at 40 minutes, 3:10 PM, 131/71, 61. Pt resting/sleeping until end of treatment then [name] PA-C comes in to discuss his progress and treatment. Discharge vital signs assessed at 4:30 PM, 140/79, 67. Pt was observed a total 120 minutes per FDA/REMS requirements, his symptoms of dissociation and sleepiness are all resolved prior to his discharge. No medications given during his treatment for any symptoms. Nursing assessed pt a total of 45 minutes. pt scheduled next Thursday, February 16 th  at 2 pm.      LOT [id number] EXP JUL 2024

## 2021-07-22 ENCOUNTER — Encounter: Payer: Self-pay | Admitting: Physician Assistant

## 2021-07-26 ENCOUNTER — Ambulatory Visit: Payer: 59

## 2021-07-26 ENCOUNTER — Other Ambulatory Visit: Payer: Self-pay

## 2021-07-26 ENCOUNTER — Ambulatory Visit (INDEPENDENT_AMBULATORY_CARE_PROVIDER_SITE_OTHER): Payer: 59 | Admitting: Physician Assistant

## 2021-07-26 VITALS — BP 138/75 | HR 62

## 2021-07-26 VITALS — BP 138/82 | HR 62

## 2021-07-26 DIAGNOSIS — F19981 Other psychoactive substance use, unspecified with psychoactive substance-induced sexual dysfunction: Secondary | ICD-10-CM

## 2021-07-26 DIAGNOSIS — F338 Other recurrent depressive disorders: Secondary | ICD-10-CM

## 2021-07-26 DIAGNOSIS — F411 Generalized anxiety disorder: Secondary | ICD-10-CM | POA: Diagnosis not present

## 2021-07-26 DIAGNOSIS — F429 Obsessive-compulsive disorder, unspecified: Secondary | ICD-10-CM

## 2021-07-26 DIAGNOSIS — F329 Major depressive disorder, single episode, unspecified: Secondary | ICD-10-CM

## 2021-07-26 DIAGNOSIS — F902 Attention-deficit hyperactivity disorder, combined type: Secondary | ICD-10-CM

## 2021-07-26 NOTE — Progress Notes (Signed)
[facility]  Patient ID: Albert [name],  MRN: [id number]  PCP: [name], PA-C  Date of Evaluation: 07/26/2021 Time spent:40 minutes  Chief Complaint:  Chief Complaint   Depression; Other     HISTORY/CURRENT STATUS: HPI For Spravato treatment.   Here for weekly Spravato. Still has SAD sx, but feels like it might be a little better. Is able to enjoy some things.  Energy and motivation are low to good, depends on the day. Not affecting his job.  Appetite has not changed.  Denies suicidal or homicidal thoughts.  OCD and generalized anxiety are well controlled for the most part with the Luvox.  States that attention is good without easy distractibility.  Able to focus on things and finish tasks to completion.  The [facility] ER is helpful. Doesn't take it daily.  Sleeps pretty good. Still has some weird dreams but not too bad. Not enough to take the Prazosin. No nightmares.  Review of Systems  Constitutional:  Positive for malaise/fatigue.  HENT: Negative.    Eyes: Negative.   Respiratory: Negative.    Cardiovascular: Negative.   Gastrointestinal: Negative.   Genitourinary: Negative.   Musculoskeletal: Negative.   Skin: Negative.   Neurological: Negative.   Endo/Heme/Allergies: Negative.   Psychiatric/Behavioral:         See HPI    Individual Medical History/ Review of Systems: Changes? :No   Past medications for mental health diagnoses include: Luvox, Lunesta, Xanax, Wellbutrin, Risperdal, Prozac, Ambien, Deplin, Abilify, naltrexone, Adderall, Spravato, Trintellix was helpful for depression but not OCD so it was discontinued.  Allergies: Sulfamethoxazole and Sulfonamide derivatives  Current Medications:  Current Outpatient Medications:    Amphetamine ER (ADZENYS XR-ODT) 6.3 MG TBED, Take 6.3 mg by mouth daily after breakfast. (Patient taking differently: Take 6.3 mg by mouth daily after breakfast. Takes as needed), Disp: 30 tablet, Rfl: 0   Esketamine  HCl, 84 MG Dose, (SPRAVATO, 84 MG DOSE,) 28 MG/DEVICE SOPK, Place 84 mg into the nose once a week., Disp: 3 each, Rfl: 3   fluvoxaMINE (LUVOX) 100 MG tablet, Take 1 tablet (100 mg total) by mouth at bedtime., Disp: 90 tablet, Rfl: 3   telmisartan-hydrochlorothiazide (MICARDIS HCT) 80-12.5 MG tablet, TAKE ONE TABLET BY MOUTH DAILY, Disp: 30 tablet, Rfl: 2   testosterone cypionate (DEPO-TESTOSTERONE) 100 MG/ML injection, Inject 0.3 mLs (30 mg total) into the muscle once a week. Discard vial 28 days after first use., Disp: 10 mL, Rfl: 0   benzonatate (TESSALON PERLES) 100 MG capsule, 1-2 capsules up to twice daily as needed for cough (Patient not taking: Reported on 06/21/2021), Disp: 30 capsule, Rfl: 0 Medication Side Effects: sexual dysfunction  tolerable   Family Medical/ Social History: Changes? No  MENTAL HEALTH EXAM:   Blood pressure 138/75, pulse 62.There is no height or weight on file to calculate BMI.  General Appearance: Casual and Well Groomed  Eye Contact:  Good  Speech:  Clear and Coherent and Normal Rate  Volume:  Normal  Mood:   more upbeat today  Affect:  Congruent  Thought Process:  Goal Directed and Descriptions of Associations: Circumstantial  Orientation:  Full (Time, Place, and Person)  Thought Content: Logical   Suicidal Thoughts:  No  Homicidal Thoughts:  No  Memory:  WNL  Judgement:  Good  Insight:  Good  Psychomotor Activity:  Normal  Concentration:  Concentration: Good and Attention Span: Good  Recall:  Good  Fund of Knowledge: Good  Language: Good  Assets:  Desire  for Improvement Financial Resources/Insurance Housing Resilience Transportation Vocational/Educational  ADL's:  Intact  Cognition: WNL  Prognosis:  Good   ECT-MADRS    Flowsheet Row Office Visit from 07/26/2021 in [place] Visit from 07/19/2021 in [place] Visit from 07/10/2021 in [place] Visit from 06/28/2021 in  [place] Visit from 04/19/2021 in [facility]  MADRS Total Score _0 PHQ2-9    Flowsheet Row Telemedicine from 08/30/2020 in [facility]  PHQ-2 Total Score 6  PHQ-9 Total Score 20      Flowsheet Row Telemedicine from 08/30/2020 in [place] Error: Q3, 4, or 5 should not be populated when Q2 is No       DIAGNOSES:    ICD-10-CM   1. Treatment-resistant depression  F32.9     2. Obsessive-compulsive disorder, unspecified type  F42.9     3. Seasonal affective disorder ([place])  F33.8     4. Generalized anxiety disorder  F41.1     5. Attention deficit hyperactivity disorder (ADHD), combined type  F90.2     6. 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  Spravato last filled 07/18/2021.  Last adzenys XR 06/28/2021.   I provided 40 minutes of face to face time during this encounter, including time spent before and after the visit in records review, medical decision making, counseling pertinent to today's visit, and charting.  Mood is improving w/ weather and increased sunlight, so no changes in tx.  Patient was administered Spravato 84 mg intranasally today.  The patient experienced the typical dissociation which gradually resolved over the 2-hour period of observation.  There were no complications.  Specifically the patient did not have nausea or vomiting or headache.  Blood pressures remained within normal ranges at the 40-minute and 2-hour follow-up intervals.  By the time the 2-hour observation period was met the patient was alert and oriented and able to exit without assistance.  Patient feels the Spravato administration is helpful for the treatment resistant depression and would like to continue the treatment.  See nursing note for further details.   Continue adzenys ER 6.3  mg, 1 p.o. every morning after breakfast. Takes it prn. Continue Luvox 100 mg, 1 p.o. nightly for OCD and depression.   Continue Spravato 84 mg weekly.  Continue Ativan 0.5 mg, 1 p.o. twice daily as needed.  He has an old prescription and rarely takes.   Return in 1 week.  [name], PA-C

## 2021-07-27 NOTE — Progress Notes (Signed)
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 Spravato is delivered locally by [facility] and stored at [employer] office in a safe behind another locked door, due to being a schedule CIII medication. Pt taken to treatment room. No complaints voiced prior to starting treatment. Initial vital signs assessed at 2:25 PM 138/75, 62. Pt asked to blow his nose and recline his chair to 45 degree angle to begin his treatment. Pt given 1 of the 28 mg dose nasal sprays self administered and nurse observed, with 5 minutes in between all 3 doses until completed. Pt brings head phones and an eye mask, the lights are turned down for comfort. Pt does fall asleep within 30 minutes. Pt's vital signs assessed at 40 minutes, 3:05PM, 136/72, 61. Pt resting/sleeping until end of treatment then [name] PA-C comes in to discuss his progress and treatment. Discharge vital signs assessed at 4:25 PM, 138/82, 62. Pt was observed a total 120 minutes per FDA/REMS requirements, his symptoms of dissociation and sleepiness are all resolved prior to his discharge. No medications given during his treatment for any symptoms. Nursing assessed pt a total of 45 minutes. pt scheduled next Thursday, February 23 rd  at 2 pm.      LOT [id number] EXP JUL 2024

## 2021-07-31 ENCOUNTER — Other Ambulatory Visit: Payer: Self-pay

## 2021-07-31 MED ORDER — SPRAVATO (84 MG DOSE) 28 MG/DEVICE NA SOPK: 84.0000 mg | PACK | NASAL | 3 refills | Status: DC

## 2021-08-02 ENCOUNTER — Ambulatory Visit: Payer: 59 | Admitting: Physician Assistant

## 2021-08-04 ENCOUNTER — Encounter: Payer: Self-pay | Admitting: Physician Assistant

## 2021-08-07 ENCOUNTER — Ambulatory Visit: Payer: 59 | Admitting: Physician Assistant

## 2021-08-09 ENCOUNTER — Ambulatory Visit (INDEPENDENT_AMBULATORY_CARE_PROVIDER_SITE_OTHER): Payer: 59 | Admitting: Physician Assistant

## 2021-08-09 ENCOUNTER — Other Ambulatory Visit: Payer: Self-pay

## 2021-08-09 ENCOUNTER — Ambulatory Visit: Payer: 59

## 2021-08-09 VITALS — BP 133/76 | HR 71

## 2021-08-09 DIAGNOSIS — F329 Major depressive disorder, single episode, unspecified: Secondary | ICD-10-CM

## 2021-08-09 DIAGNOSIS — F902 Attention-deficit hyperactivity disorder, combined type: Secondary | ICD-10-CM

## 2021-08-09 DIAGNOSIS — F338 Other recurrent depressive disorders: Secondary | ICD-10-CM | POA: Diagnosis not present

## 2021-08-09 DIAGNOSIS — F429 Obsessive-compulsive disorder, unspecified: Secondary | ICD-10-CM

## 2021-08-09 DIAGNOSIS — F19981 Other psychoactive substance use, unspecified with psychoactive substance-induced sexual dysfunction: Secondary | ICD-10-CM

## 2021-08-14 NOTE — Progress Notes (Signed)
NURSE VISIT: ?  ?Pt here for his weekly Spravato Treatment of 84 mg (3 of the 28 mg) nasal sprays for his treatment resistant depression. Pt did miss last week but he said he was feeling better. Spravato is delivered locally by [facility] and stored at [employer] office in a safe behind another locked door, due to being a schedule CIII medication. Pt taken to treatment room. No complaints voiced prior to starting treatment. Initial vital signs assessed at 2:06 PM 139/77, 71. Pt asked to blow his nose and recline his chair to 45 degree angle to begin his treatment. Pt given 1 of the 28 mg dose nasal sprays self administered and nurse observed, with 5 minutes in between all 3 doses until completed. Pt brings head phones and an eye mask, the lights are turned down for comfort. Pt does fall asleep within 30 minutes. Pt's vital signs assessed at 40 minutes, 2:45 PM, 130/72, 67. Pt resting/sleeping until end of treatment then [name] PA-C comes in to discuss his progress and treatment. Discharge vital signs assessed at 4:07 PM, 133/76, 71. Pt was observed a total 120 minutes per FDA/REMS requirements, his symptoms of dissociation and sleepiness are all resolved prior to his discharge. No medications given during his treatment for any symptoms. Nursing assessed pt a total of 45 minutes. pt scheduled next Thursday, March 9th  at 2 pm.  ?  ?  ?LOT [id number] ?EXP JUL 2024 ?

## 2021-08-23 ENCOUNTER — Ambulatory Visit: Payer: 59 | Admitting: Physician Assistant

## 2021-08-30 ENCOUNTER — Ambulatory Visit (INDEPENDENT_AMBULATORY_CARE_PROVIDER_SITE_OTHER): Payer: 59 | Admitting: Physician Assistant

## 2021-08-30 ENCOUNTER — Encounter: Payer: Self-pay | Admitting: Physician Assistant

## 2021-08-30 ENCOUNTER — Ambulatory Visit: Payer: 59

## 2021-08-30 ENCOUNTER — Other Ambulatory Visit: Payer: Self-pay

## 2021-08-30 ENCOUNTER — Ambulatory Visit: Payer: 59 | Admitting: Physician Assistant

## 2021-08-30 VITALS — BP 125/75 | HR 69

## 2021-08-30 DIAGNOSIS — F902 Attention-deficit hyperactivity disorder, combined type: Secondary | ICD-10-CM | POA: Diagnosis not present

## 2021-08-30 DIAGNOSIS — F429 Obsessive-compulsive disorder, unspecified: Secondary | ICD-10-CM

## 2021-08-30 DIAGNOSIS — F329 Major depressive disorder, single episode, unspecified: Secondary | ICD-10-CM

## 2021-08-30 DIAGNOSIS — F338 Other recurrent depressive disorders: Secondary | ICD-10-CM | POA: Diagnosis not present

## 2021-08-30 MED ORDER — ADZENYS XR-ODT 6.3 MG PO TBED: 6.3000 mg | EXTENDED_RELEASE_TABLET | Freq: Every day | ORAL | 0 refills | Status: DC

## 2021-08-30 NOTE — Progress Notes (Signed)
[facility] ? ?Patient ID: Albert [name],  ?MRN: [id number] ? ?PCP: [name], PA-C ? ?Date of Evaluation: 08/30/2021  ?time spent:40 minutes ? ?Chief Complaint:  ?Chief Complaint   ?ADHD; Depression; Follow-up ?  ? ? ?HISTORY/CURRENT STATUS: ?HPI For Spravato treatment.  ? ?It has been 3 weeks since his last Spravato treatment.  He is gradually feeling better now that the weather has improved. States he quit drinking any alcohol approximately a month ago.  That has helped his mood as well.   He is more able to enjoy things and is not feeling as sluggish.  Energy and motivation have improved.  He is not isolating.  ADLs and personal hygiene are normal.  Work is going well.  He sleeps fine.  No suicidal or homicidal thoughts. ? ?He has been on Adzenys XR ODT for several months now, taking it as needed.  It has been effective.  Is able to focus and get things done in a timely manner. ? ?Does still have obsessions at times, not severe though and to the Luvox is working well.  Compulsions are not so bad that they take time out of his every day life. ? ?Denies dizziness, syncope, seizures, numbness, tingling, tremor, tics, unsteady gait, slurred speech, confusion. Denies muscle or joint pain, stiffness, or dystonia. ? ?Individual Medical History/ Review of Systems: Changes? :No  ? ?Past medications for mental health diagnoses include: ?Luvox, Lunesta, Xanax, Wellbutrin, Risperdal, Prozac, Ambien, Deplin, Abilify, naltrexone, Adderall, Spravato, Trintellix was helpful for depression but not OCD so it was discontinued. ? ?Allergies: Sulfamethoxazole and Sulfonamide derivatives ? ?Current Medications:  ?Current Outpatient Medications:  ?  Esketamine HCl, 84 MG Dose, (SPRAVATO, 84 MG DOSE,) 28 MG/DEVICE SOPK, Place 84 mg into the nose once a week., Disp: 3 each, Rfl: 3 ?  fluvoxaMINE (LUVOX) 100 MG tablet, Take 1 tablet (100 mg total) by mouth at bedtime., Disp: 90 tablet, Rfl: 3 ?   telmisartan-hydrochlorothiazide (MICARDIS HCT) 80-12.5 MG tablet, TAKE ONE TABLET BY MOUTH DAILY, Disp: 30 tablet, Rfl: 2 ?  testosterone cypionate (DEPO-TESTOSTERONE) 100 MG/ML injection, Inject 0.3 mLs (30 mg total) into the muscle once a week. Discard vial 28 days after first use., Disp: 10 mL, Rfl: 0 ?  Amphetamine ER (ADZENYS XR-ODT) 6.3 MG TBED, Take 6.3 mg by mouth daily after breakfast. Takes as needed, Disp: 30 tablet, Rfl: 0 ?  benzonatate (TESSALON PERLES) 100 MG capsule, 1-2 capsules up to twice daily as needed for cough (Patient not taking: Reported on 06/21/2021), Disp: 30 capsule, Rfl: 0 ?Medication Side Effects: sexual dysfunction  tolerable  ? ?Family Medical/ Social History: Changes? No ? ?MENTAL HEALTH EXAM: ? ?Before Spravato BP 124/69.  Pulse 66 ?40 minutes after Spravato administration BP 142/75 and pulse 73. ?2 hours after Spravato administration BP was 125/75 and pulse 69. ? ?Blood pressure 125/75, pulse 69.There is no height or weight on file to calculate BMI.    ?General Appearance: Casual and Well Groomed  ?Eye Contact:  Good  ?Speech:  Clear and Coherent and Normal Rate  ?Volume:  Normal  ?Mood:  Euthymic  ?Affect:  Appropriate  ?Thought Process:  Goal Directed and Descriptions of Associations: Circumstantial  ?Orientation:  Full (Time, Place, and Person)  ?Thought Content: Logical   ?Suicidal Thoughts:  No  ?Homicidal Thoughts:  No  ?Memory:  WNL  ?Judgement:  Good  ?Insight:  Good  ?Psychomotor Activity:  Normal  ?Concentration:  Concentration: Good and Attention Span: Good  ?Recall:  Good  ?Fund of Knowledge: Good  ?Language: Good  ?Assets:  Desire for Improvement ?Financial Resources/Insurance ?Housing ?Resilience ?Transportation ?Vocational/Educational  ?ADL's:  Intact  ?Cognition: WNL  ?Prognosis:  Good  ? ?ECT-MADRS   ? ?[place] Visit from 08/30/2021 in [place] Visit from 07/26/2021 in [place] Visit from 07/19/2021 in  [place] Visit from 07/10/2021 in [place] Visit from 06/28/2021 in [place]  ?MADRS Total Score _0 ? ?  ? ?PHQ2-9   ? ?[place] from 08/30/2020 in [place]  ?PHQ-2 Total Score 6  ?PHQ-9 Total Score 20  ? ?  ? ?[place] from 08/30/2020 in [place]  ?C-SSRS RISK CATEGORY Error: Q3, 4, or 5 should not be populated when Q2 is No  ? ?  ? ? ?DIAGNOSES:  ?  ICD-10-CM   ?1. Treatment-resistant depression  F32.9   ?  ?2. Seasonal affective disorder ([place])  F33.8   ?  ?3. Obsessive-compulsive disorder, unspecified type  F42.9   ?  ?4. Attention deficit hyperactivity disorder (ADHD), combined type  F90.2   ?  ? ? ?Receiving Psychotherapy: No  ? ? ?RECOMMENDATIONS:  ?PDMP was reviewed.  Spravato filled 08/26/2021.  Adzenys XR ODT filled 06/28/2021. ?I provided 40 minutes of face to face time during this encounter, including time spent before and after the visit in records review, medical decision making, counseling pertinent to today's visit, and charting.  ?He is doing much better as far as the depression goes and would like to decrease Spravato treatments to once a month.  That is fine. ? ?Patient was administered Spravato 84 mg intranasally today.  The patient experienced the typical dissociation which gradually resolved over the 2-hour period of observation.  There were no complications.  Specifically the patient did not have nausea or vomiting or headache.  Blood pressures remained within normal ranges at the 40-minute and 2-hour follow-up intervals.  By the time the 2-hour observation period was met the patient was alert and oriented and able to exit without assistance.  Patient feels the Spravato administration is helpful for the treatment resistant depression and would like to continue the treatment.  See nursing note for further details. ? ?Continue adzenys ER 6.3 mg,  1 p.o. every morning after breakfast. Takes it prn. ?Continue Luvox 100 mg, 1 p.o. nightly for OCD and depression.   ?Continue Spravato 84 mg, due in 1 month. ?Continue Ativan 0.5 mg, 1 p.o. twice daily as needed.  He has an old prescription and rarely takes.   ?Return in 4 weeks. ? ?[name], PA-C    ?

## 2021-08-30 NOTE — Progress Notes (Signed)
[facility] ? ?Patient ID: Albert [name],  ?MRN: [id number] ? ?PCP: [name], PA-C ? ?Date of Evaluation: 08/09/2021 ?Time spent:40 minutes ? ?Chief Complaint:  ?Chief Complaint   ?Depression; Other ?  ? ? ?HISTORY/CURRENT STATUS: ?HPI For Spravato treatment.  ? ?Albert [name] is still responding well to the Spravato treatments.  Continues to have some mild depression due to the winter season but feels like spring is coming and his mood is starting to improve as well.  Energy and motivation are fair to good depending on the day.  He is able to work without any problems.  Still feels tired quite a bit even though he gets a lot of sleep.  Sometimes has trouble focusing but taking the Adzenys XR ODT as needed is helpful.  Appetite is normal and weight is stable.  He is not isolating.  Does not cry easily.  ADLs and personal hygiene are normal.  No suicidal or homicidal thoughts. ? ?OCD symptoms are well controlled with the Luvox.  He will still have obsessions at times but for the most part he is not having any problems with compulsions to the point that it affects his every day life. ? ?Review of Systems  ?Constitutional:  Positive for malaise/fatigue.  ?HENT: Negative.    ?Eyes: Negative.   ?Respiratory: Negative.    ?Cardiovascular: Negative.   ?Gastrointestinal: Negative.   ?Genitourinary: Negative.   ?Musculoskeletal: Negative.   ?Skin: Negative.   ?Neurological: Negative.   ?Endo/Heme/Allergies: Negative.   ?Psychiatric/Behavioral:    ?     See HPI.  ? ?Individual Medical History/ Review of Systems: Changes? :No  ? ?Past medications for mental health diagnoses include: ?Luvox, Lunesta, Xanax, Wellbutrin, Risperdal, Prozac, Ambien, Deplin, Abilify, naltrexone, Adderall, Spravato, Trintellix was helpful for depression but not OCD so it was discontinued. ? ?Allergies: Sulfamethoxazole and Sulfonamide derivatives ? ?Current Medications:  ?Current Outpatient Medications:  ?  Amphetamine ER (ADZENYS XR-ODT) 6.3  MG TBED, Take 6.3 mg by mouth daily after breakfast. (Patient taking differently: Take 6.3 mg by mouth daily after breakfast. Takes as needed), Disp: 30 tablet, Rfl: 0 ?  Esketamine HCl, 84 MG Dose, (SPRAVATO, 84 MG DOSE,) 28 MG/DEVICE SOPK, Place 84 mg into the nose once a week., Disp: 3 each, Rfl: 3 ?  fluvoxaMINE (LUVOX) 100 MG tablet, Take 1 tablet (100 mg total) by mouth at bedtime., Disp: 90 tablet, Rfl: 3 ?  telmisartan-hydrochlorothiazide (MICARDIS HCT) 80-12.5 MG tablet, TAKE ONE TABLET BY MOUTH DAILY, Disp: 30 tablet, Rfl: 2 ?  testosterone cypionate (DEPO-TESTOSTERONE) 100 MG/ML injection, Inject 0.3 mLs (30 mg total) into the muscle once a week. Discard vial 28 days after first use., Disp: 10 mL, Rfl: 0 ?  benzonatate (TESSALON PERLES) 100 MG capsule, 1-2 capsules up to twice daily as needed for cough (Patient not taking: Reported on 06/21/2021), Disp: 30 capsule, Rfl: 0 ?Medication Side Effects: sexual dysfunction  tolerable  ? ?Family Medical/ Social History: Changes? No ? ?MENTAL HEALTH EXAM: ? ? ?There were no vitals taken for this visit.There is no height or weight on file to calculate BMI.  ?General Appearance: Casual and Well Groomed  ?Eye Contact:  Good  ?Speech:  Clear and Coherent and Normal Rate  ?Volume:  Normal  ?Mood:  Euthymic  ?Affect:  Congruent  ?Thought Process:  Goal Directed and Descriptions of Associations: Circumstantial  ?Orientation:  Full (Time, Place, and Person)  ?Thought Content: Logical   ?Suicidal Thoughts:  No  ?Homicidal Thoughts:  No  ?  Memory:  WNL  ?Judgement:  Good  ?Insight:  Good  ?Psychomotor Activity:  Normal  ?Concentration:  Concentration: Good and Attention Span: Good  ?Recall:  Good  ?Fund of Knowledge: Good  ?Language: Good  ?Assets:  Desire for Improvement ?Financial Resources/Insurance ?Housing ?Resilience ?Transportation ?Vocational/Educational  ?ADL's:  Intact  ?Cognition: WNL  ?Prognosis:  Good  ? ? ?DIAGNOSES:  ?  ICD-10-CM   ?1. Treatment-resistant  depression  F32.9   ?  ?2. Seasonal affective disorder ([place])  F33.8   ?  ?3. Obsessive-compulsive disorder, unspecified type  F42.9   ?  ?4. Attention deficit hyperactivity disorder (ADHD), combined type  F90.2   ?  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  ? ? ?Receiving Psychotherapy: No  ? ? ?RECOMMENDATIONS:  ?PDMP was reviewed.   ?I provided 40 minutes of face to face time during this encounter, including time spent before and after the visit in records review, medical decision making, counseling pertinent to today's visit, and charting.  ?He is still responding well so cont Spravato. He prefers to spread it out even longer, due to cost.  ? ?Patient was administered Spravato 84 mg intranasally today.  The patient experienced the typical dissociation which gradually resolved over the 2-hour period of observation.  There were no complications.  Specifically the patient did not have nausea or vomiting or headache.  Blood pressures remained within normal ranges at the 40-minute and 2-hour follow-up intervals.  By the time the 2-hour observation period was met the patient was alert and oriented and able to exit without assistance.  Patient feels the Spravato administration is helpful for the treatment resistant depression and would like to continue the treatment.  See nursing note for further details. ?  ?Continue adzenys ER 6.3 mg, 1 p.o. every morning after breakfast. Takes it prn. ?Continue Luvox 100 mg, 1 p.o. nightly for OCD and depression.   ?Continue Spravato 84 mg weekly.  ?Continue Ativan 0.5 mg, 1 p.o. twice daily as needed.  He has an old prescription and rarely takes.   ?Return in 2 weeks. ? ?[name], PA-C    ?

## 2021-09-03 ENCOUNTER — Encounter: Payer: Self-pay | Admitting: Physician Assistant

## 2021-09-03 NOTE — Progress Notes (Signed)
NURSE VISIT: ?  ?Pt here for his Spravato Treatment of 84 mg (3 of the 28 mg) nasal sprays for his treatment resistant depression. Pt has made some lifestyle changes so he feels he does not need to come to his treatment weekly. His last visit was 08/09/2021. Pt will probably come every 3-4 weeks. Spravato is delivered locally by [facility] and stored at [employer] office in a safe behind another locked door, due to being a schedule CIII medication. Pt taken to treatment room. No complaints voiced prior to starting treatment. Initial vital signs assessed at 2:16 PM 124/69, 66. Pt asked to blow his nose and recline his chair to 45 degree angle to begin his treatment. Pt given 1 of the 28 mg dose nasal sprays self administered and nurse observed, with 5 minutes in between all 3 doses until completed. Pt brings head phones and an eye mask, the lights are turned down for comfort. Pt does doze off within 30 minutes. Pt's vital signs assessed at 40 minutes, 2:55 PM, 142/75, 73. Pt resting/sleeping until end of treatment then [name] PA-C comes in to discuss his progress and treatment. Discharge vital signs assessed at 4:11 PM, 125/75, 69. Pt was observed a total 120 minutes per FDA/REMS requirements, his symptoms of dissociation and sleepiness are all resolved prior to his discharge. No medications given during his treatment for any symptoms. Nursing assessed pt a total of 45 minutes.Pt will call back to get scheduled probably the end of April, pt will be out of town so needs to work around his work schedule. ?  ?  ?LOT [id number] ?EXP JUL 2024 ?

## 2021-09-06 ENCOUNTER — Ambulatory Visit: Payer: 59 | Admitting: Physician Assistant

## 2021-09-13 ENCOUNTER — Other Ambulatory Visit: Payer: Self-pay | Admitting: Medical

## 2021-10-04 ENCOUNTER — Ambulatory Visit: Payer: 59

## 2021-10-04 ENCOUNTER — Ambulatory Visit (INDEPENDENT_AMBULATORY_CARE_PROVIDER_SITE_OTHER): Payer: 59 | Admitting: Physician Assistant

## 2021-10-04 ENCOUNTER — Encounter: Payer: Self-pay | Admitting: Physician Assistant

## 2021-10-04 VITALS — BP 140/87 | HR 65

## 2021-10-04 DIAGNOSIS — F329 Major depressive disorder, single episode, unspecified: Secondary | ICD-10-CM | POA: Diagnosis not present

## 2021-10-04 DIAGNOSIS — F902 Attention-deficit hyperactivity disorder, combined type: Secondary | ICD-10-CM

## 2021-10-04 DIAGNOSIS — G4700 Insomnia, unspecified: Secondary | ICD-10-CM | POA: Diagnosis not present

## 2021-10-04 DIAGNOSIS — F429 Obsessive-compulsive disorder, unspecified: Secondary | ICD-10-CM

## 2021-10-04 MED ORDER — ESZOPICLONE 1 MG PO TABS: 1.0000 mg | ORAL_TABLET | Freq: Every evening | ORAL | 0 refills | Status: DC | PRN

## 2021-10-04 NOTE — Progress Notes (Signed)
[facility] ? ?Patient ID: Albert [name],  ?MRN: [id number] ? ?PCP: [name], PA-C ? ?Date of Evaluation: 10/04/2021  ?time spent:40 minutes ? ?Chief Complaint:  ?Chief Complaint   ?Depression; Insomnia; Other; Follow-up ?  ? ? ?HISTORY/CURRENT STATUS: ?HPI For Spravato treatment.  ? ?It has been 4 weeks since his last Spravato treatment.  He is feeling better now that the weather has improved.  Is able to enjoy things. Energy and motivation are good. He is not isolating.  ADLs and personal hygiene are normal.  Work is going well. No suicidal or homicidal thoughts. ? ?His main problem for the past few weeks is that he has not been sleeping well.  Has not been able to fall asleep.  He had some old [place] which has helped.  He has not been taking it every night and does not want to.  He does not want to become dependent on it and not be able to fall asleep without it.  No increased caffeine use or any change in sleep hygiene. ? ?He has been on Adzenys XR ODT for several months now, taking it as needed.  It has been effective.  Is able to focus and get things done in a timely manner. ? ?Does still have obsessions at times, felt like they got a little worse before this Spravato treatment.  Not severe though.  Compulsions are not so bad that they take time out of his every day life. ? ?Patient denies increased energy with decreased need for sleep, no increased talkativeness, no racing thoughts, no impulsivity or risky behaviors, no increased spending, no increased libido, no grandiosity, no increased irritability or anger, and no hallucinations. ? ?Denies dizziness, syncope, seizures, numbness, tingling, tremor, tics, unsteady gait, slurred speech, confusion. Denies muscle or joint pain, stiffness, or dystonia. Denies unexplained weight loss, frequent infections, or sores that heal slowly.  No polyphagia, polydipsia, or polyuria. Denies visual changes or paresthesias.  ? ? ?Individual Medical History/  Review of Systems: Changes? :No  ? ?Past medications for mental health diagnoses include: ?Luvox, Lunesta, Xanax, Wellbutrin, Risperdal, Prozac, Ambien, Deplin, Abilify, naltrexone, Adderall, Spravato, Trintellix was helpful for depression but not OCD so it was discontinued. ? ?Allergies: Sulfamethoxazole and Sulfonamide derivatives ? ?Current Medications:  ?Current Outpatient Medications:  ?  Amphetamine ER (ADZENYS XR-ODT) 6.3 MG TBED, Take 6.3 mg by mouth daily after breakfast. Takes as needed, Disp: 30 tablet, Rfl: 0 ?  Esketamine HCl, 84 MG Dose, (SPRAVATO, 84 MG DOSE,) 28 MG/DEVICE SOPK, Place 84 mg into the nose once a week., Disp: 3 each, Rfl: 3 ?  eszopiclone (LUNESTA) 1 MG TABS tablet, Take 1 mg by mouth at bedtime as needed for sleep., Disp: , Rfl:  ?  fluvoxaMINE (LUVOX) 100 MG tablet, Take 1 tablet (100 mg total) by mouth at bedtime., Disp: 90 tablet, Rfl: 3 ?  telmisartan-hydrochlorothiazide (MICARDIS HCT) 80-12.5 MG tablet, TAKE ONE TABLET BY MOUTH DAILY, Disp: 30 tablet, Rfl: 2 ?  testosterone cypionate (DEPO-TESTOSTERONE) 100 MG/ML injection, Inject 0.3 mLs (30 mg total) into the muscle once a week. Discard vial 28 days after first use., Disp: 10 mL, Rfl: 0 ?  benzonatate (TESSALON PERLES) 100 MG capsule, 1-2 capsules up to twice daily as needed for cough (Patient not taking: Reported on 06/21/2021), Disp: 30 capsule, Rfl: 0 ?  eszopiclone (LUNESTA) 1 MG TABS tablet, Take 1 tablet (1 mg total) by mouth at bedtime as needed for sleep., Disp: 30 tablet, Rfl: 0 ?Medication Side  Effects: sexual dysfunction  tolerable  ? ?Family Medical/ Social History: Changes? No ? ?MENTAL HEALTH EXAM: ? ?3:30 PM before Spravato BP 129/76, pulse 72 ?4:08 PM 40 minutes after Spravato BP 138/89, pulse 62 ?5:30 PM, 2 hours after Spravato administered blood pressure 140/87, pulse 65 ? ?There were no vitals taken for this visit.There is no height or weight on file to calculate BMI.    ?General Appearance: Casual and Well  Groomed  ?Eye Contact:  Good  ?Speech:  Clear and Coherent and Normal Rate  ?Volume:  Normal  ?Mood:  Euthymic  ?Affect:  Appropriate  ?Thought Process:  Goal Directed and Descriptions of Associations: Circumstantial  ?Orientation:  Full (Time, Place, and Person)  ?Thought Content: Logical   ?Suicidal Thoughts:  No  ?Homicidal Thoughts:  No  ?Memory:  WNL  ?Judgement:  Good  ?Insight:  Good  ?Psychomotor Activity:  Normal  ?Concentration:  Concentration: Good and Attention Span: Good  ?Recall:  Good  ?Fund of Knowledge: Good  ?Language: Good  ?Assets:  Desire for Improvement ?Financial Resources/Insurance ?Housing ?Transportation ?Vocational/Educational  ?ADL's:  Intact  ?Cognition: WNL  ?Prognosis:  Good  ? ?ECT-MADRS   ? ?Flowsheet Row Clinical Support from 10/04/2021 in [place] Visit from 08/30/2021 in [place] Visit from 07/26/2021 in [place] Visit from 07/19/2021 in [place] Visit from 07/10/2021 in [place]  ?MADRS Total Score $RemoveBef'3 3 9 8 18  '[id number]$ ? ?  ? ?PHQ2-9   ? ?[place] from 08/30/2020 in [place]  ?PHQ-2 Total Score 6  ?PHQ-9 Total Score 20  ? ?  ? ?[place] from 08/30/2020 in [place]  ?C-SSRS RISK CATEGORY Error: Q3, 4, or 5 should not be populated when Q2 is No  ? ?  ? ? ?DIAGNOSES:  ?  ICD-10-CM   ?1. Treatment-resistant depression  F32.9   ?  ?2. Obsessive-compulsive disorder, unspecified type  F42.9   ?  ?3. Attention deficit hyperactivity disorder (ADHD), combined type  F90.2   ?  ?4. Insomnia, unspecified type  G47.00   ?  ? ? ? ?Receiving Psychotherapy: No  ? ? ?RECOMMENDATIONS:  ?PDMP was reviewed.  Spravato filled 08/26/2021.  Adzenys XR ODT filled 08/31/2021. ?I provided 40 minutes of face to face time during this encounter, including time spent before and after the visit in records review, medical decision  making, counseling pertinent to today's visit, and charting.  ?Discussed sleep hygiene.  We will send in a refill of the Lunesta.  And agreed to take it as infrequent as possible. ?Because he did have more OCD symptoms the week to 2 weeks prior to the Spravato he would like to consider having a treatment again in 1 to 2 weeks.  That is fine. ? ?Patient was administered Spravato 84 mg intranasally today.  The patient experienced the typical dissociation which gradually resolved over the 2-hour period of observation. No complications, no nausea or vomiting or headache.  Blood pressures remained within normal ranges at the 40-minute and 2-hour follow-up intervals.  By the time the 2-hour observation period was met the patient was alert and oriented and able to exit without assistance.  Patient feels the Spravato administration is helpful for the treatment resistant depression and would like to continue the treatment.  See nursing note for further details. ? ?Continue adzenys ER 6.3 mg, 1 p.o. every morning after breakfast. Takes it prn. ?Continue Luvox 100  mg, 1 p.o. nightly for OCD and depression.   ?Continue Spravato 84 mg, due in 1 month. ?Continue Lunesta 1 mg, 1 p.o. nightly as needed sleep. ?Continue Ativan 0.5 mg, 1 p.o. twice daily as needed.  He has an old prescription and rarely takes.   ?Return in 2 weeks. ? ?[name], PA-C    ?

## 2021-10-05 NOTE — Progress Notes (Signed)
NURSE VISIT: ?  ?Pt here for his Spravato Treatment of 84 mg (3 of the 28 mg) nasal sprays for his treatment resistant depression. Pt's last treatment was on 08/30/2021. Pt has made some lifestyle changes so he feels he does not need to come to his treatment weekly. Pt will probably come every 3-4 weeks. Spravato is delivered locally by [facility] and stored at [employer] office in a safe behind another locked door, due to being a schedule CIII medication. Pt taken to treatment room. No complaints voiced prior to starting treatment. He does report his OCD has worsened some, he does get benefit from his Spravato treatments to help that as well. Initial vital signs assessed at 3:30 PM 129/76, 72. Pt asked to blow his nose and recline his chair to 45 degree angle to begin his treatment. Pt given 1 of the 28 mg dose nasal sprays self administered and nurse observed, with 5 minutes in between all 3 doses until completed. Pt brings head phones and an eye mask, the lights are turned down for comfort. Pt does doze off within 30 minutes but it's just off and on throughout his treatment. Pt does experience dissociation but he is all clear by time of his discharge. Pt's vital signs assessed at 40 minutes, 4:08 PM, 138/89, 62. Pt resting/sleeping until end of treatment then [name] PA-C comes in to discuss his progress and treatment. Discharge vital signs assessed at 5:30 PM, 140/87, 65. Pt was observed a total 120 minutes per FDA/REMS requirements, his symptoms of dissociation and sleepiness are all resolved prior to his discharge. No medications given during his treatment for any symptoms. Nursing assessed pt a total of 45 minutes.Pt will call back to get scheduled, he may start doing every other week depending on how he feels.  ? ?  ?  ?LOT [id number] ?EXP JUL 2024 ?

## 2021-10-16 ENCOUNTER — Other Ambulatory Visit: Payer: Self-pay | Admitting: Physician Assistant

## 2021-10-16 MED ORDER — FLUVOXAMINE MALEATE 50 MG PO TABS: 25.0000 mg | ORAL_TABLET | Freq: Every day | ORAL | 3 refills | Status: DC

## 2021-12-12 ENCOUNTER — Encounter: Payer: 59 | Admitting: Medical

## 2021-12-20 ENCOUNTER — Other Ambulatory Visit (HOSPITAL_COMMUNITY): Payer: Self-pay

## 2022-01-03 ENCOUNTER — Ambulatory Visit: Payer: 59

## 2022-01-03 ENCOUNTER — Encounter: Payer: Self-pay | Admitting: Physician Assistant

## 2022-01-03 ENCOUNTER — Ambulatory Visit (INDEPENDENT_AMBULATORY_CARE_PROVIDER_SITE_OTHER): Payer: 59 | Admitting: Physician Assistant

## 2022-01-03 VITALS — BP 137/76 | HR 64

## 2022-01-03 DIAGNOSIS — F329 Major depressive disorder, single episode, unspecified: Secondary | ICD-10-CM

## 2022-01-03 DIAGNOSIS — F429 Obsessive-compulsive disorder, unspecified: Secondary | ICD-10-CM | POA: Diagnosis not present

## 2022-01-03 DIAGNOSIS — F902 Attention-deficit hyperactivity disorder, combined type: Secondary | ICD-10-CM | POA: Diagnosis not present

## 2022-01-03 DIAGNOSIS — G4700 Insomnia, unspecified: Secondary | ICD-10-CM | POA: Diagnosis not present

## 2022-01-03 MED ORDER — ESZOPICLONE 1 MG PO TABS
1.0000 mg | ORAL_TABLET | Freq: Every evening | ORAL | 5 refills | Status: DC | PRN
Start: 2022-01-03 — End: 2022-08-05

## 2022-01-03 MED ORDER — ADZENYS XR-ODT 6.3 MG PO TBED: 6.3000 mg | EXTENDED_RELEASE_TABLET | Freq: Every day | ORAL | 0 refills | Status: DC

## 2022-01-03 NOTE — Progress Notes (Unsigned)
[facility]  Patient ID: Albert [name],  MRN: [id number]  PCP: Albert Pai, PA-C  Date of Evaluation: 01/03/2022  time spent:40 minutes  Chief Complaint:  Chief Complaint   Other    HISTORY/CURRENT STATUS: HPI For Spravato treatment.   [name] is doing pretty well.  He has not had a Spravato treatment in approximately 3 months.  He always does better in the summertime with his mood.  He has purposely lost weight, is outside more, playing golf and enjoying that.  Work is going well.  Things at home are fine.  Does not cry easily.  Appetite is normal and weight is stable.  No suicidal or homicidal thoughts.  OCD symptoms are mostly well controlled.  Sometimes he will get a thought in his hand and cannot shake it but that is not very often.  Generalized anxiety is not a big problem either.  Patient denies increased energy with decreased need for sleep, no increased talkativeness, no racing thoughts, no impulsivity or risky behaviors, no increased spending, no increased libido, no grandiosity, no increased irritability or anger, and no hallucinations.  Denies dizziness, syncope, seizures, numbness, tingling, tremor, tics, unsteady gait, slurred speech, confusion. Denies muscle or joint pain, stiffness, or dystonia. Denies unexplained weight loss, frequent infections, or sores that heal slowly.  No polyphagia, polydipsia, or polyuria. Denies visual changes or paresthesias.   Individual Medical History/ Review of Systems: Changes? :No   Past medications for mental health diagnoses include: Luvox, Lunesta, Xanax, Wellbutrin, Risperdal, Prozac, Ambien, Deplin, Abilify, naltrexone, Adderall, Spravato, Trintellix was helpful for depression but not OCD so it was discontinued.  Allergies: Sulfamethoxazole and Sulfonamide derivatives  Current Medications:  Current Outpatient Medications:    Esketamine HCl, 84 MG Dose, (SPRAVATO, 84 MG DOSE,) 28 MG/DEVICE SOPK, Place 84 mg into the  nose once a week., Disp: 3 each, Rfl: 3   fluvoxaMINE (LUVOX) 100 MG tablet, Take 1 tablet (100 mg total) by mouth at bedtime. (Patient taking differently: Take 100 mg by mouth at bedtime. +25 mg=125 mg.), Disp: 90 tablet, Rfl: 3   fluvoxaMINE (LUVOX) 50 MG tablet, Take 0.5 tablets (25 mg total) by mouth at bedtime. With 100 mg=181m., Disp: 45 tablet, Rfl: 3   telmisartan-hydrochlorothiazide (MICARDIS HCT) 80-12.5 MG tablet, TAKE ONE TABLET BY MOUTH DAILY, Disp: 30 tablet, Rfl: 2   testosterone cypionate (DEPO-TESTOSTERONE) 100 MG/ML injection, Inject 0.3 mLs (30 mg total) into the muscle once a week. Discard vial 28 days after first use., Disp: 10 mL, Rfl: 0   Amphetamine ER (ADZENYS XR-ODT) 6.3 MG TBED, Take 6.3 mg by mouth daily after breakfast. Takes as needed, Disp: 30 tablet, Rfl: 0   benzonatate (TESSALON PERLES) 100 MG capsule, 1-2 capsules up to twice daily as needed for cough (Patient not taking: Reported on 06/21/2021), Disp: 30 capsule, Rfl: 0   eszopiclone (LUNESTA) 1 MG TABS tablet, Take 1 tablet (1 mg total) by mouth at bedtime as needed for sleep., Disp: 30 tablet, Rfl: 5 Medication Side Effects: sexual dysfunction  tolerable   Family Medical/ Social History: Changes? No  MENTAL HEALTH EXAM:  There were no vitals taken for this visit.There is no height or weight on file to calculate BMI.    General Appearance: Casual and Well Groomed  Eye Contact:  Good  Speech:  Clear and Coherent and Normal Rate  Volume:  Normal  Mood:  Euthymic  Affect:  Appropriate  Thought Process:  Goal Directed and Descriptions of Associations: Circumstantial  Orientation:  Full (Time, Place, and Person)  Thought Content: Logical   Suicidal Thoughts:  No  Homicidal Thoughts:  No  Memory:  WNL  Judgement:  Good  Insight:  Good  Psychomotor Activity:  Normal  Concentration:  Concentration: Good and Attention Span: Good  Recall:  Good  Fund of Knowledge: Good  Language: Good  Assets:  Desire for  Improvement  ADL's:  Intact  Cognition: WNL  Prognosis:  Good   ECT-MADRS    Flowsheet Row Clinical Support from 01/03/2022 in [place] from 10/04/2021 in [place] Visit from 08/30/2021 in [place] Visit from 07/26/2021 in [place] Visit from 07/19/2021 in [facility]  MADRS Total Score _0 PHQ2-9    Flowsheet Row Telemedicine from 08/30/2020 in [facility]  PHQ-2 Total Score 6  PHQ-9 Total Score 20      Flowsheet Row Telemedicine from 08/30/2020 in [facility]  C-SSRS RISK CATEGORY Error: Q3, 4, or 5 should not be populated when Q2 is No      Patient was administered Spravato 84 mg intranasally today.  The patient experienced the typical dissociation which gradually resolved over the 2-hour period of observation. No complications, no nausea or vomiting or headache.  Blood pressures remained within normal ranges at the 40-minute and 2-hour follow-up intervals.  By the time the 2-hour observation period was met the patient was alert and oriented and able to exit without assistance.  Patient feels the Spravato administration is helpful for the treatment resistant depression and would like to continue the treatment.  See nursing note for further details.   DIAGNOSES:    ICD-10-CM   1. Treatment-resistant depression  F32.9     2. Obsessive-compulsive disorder, unspecified type  F42.9     3. Attention deficit hyperactivity disorder (ADHD), combined type  F90.2     4. Insomnia, unspecified type  G47.00       Receiving Psychotherapy: No    RECOMMENDATIONS:  PDMP was reviewed.  Spravato filled 08/26/2021.  Adzenys XR ODT filled 08/31/2021.  Lunesta filled 10/08/2021. I provided 40  minutes of face to face time during this encounter, including time spent before and after the visit in records review, medical decision  making, counseling pertinent to today's visit, and charting.   He has responded very well to Spravato in the past so it is important to get this started in time for the typical fall and winter depression.   He takes the Adzenys XR ODT sporadically and it is effective when needed.  Continue adzenys ER 6.3 mg, 1 p.o. every morning after breakfast. Takes it prn. Continue Luvox 125 mg nightly for OCD and depression.   Continue Spravato 84 mg, due in 1 month. Continue Lunesta 1 mg, 1 p.o. nightly as needed sleep. Continue Ativan 0.5 mg, 1 p.o. twice daily as needed.  He has an old prescription and rarely takes.   Return in 1 month.  [name], PA-C

## 2022-01-05 ENCOUNTER — Encounter: Payer: Self-pay | Admitting: Physician Assistant

## 2022-01-06 NOTE — Progress Notes (Signed)
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 Pt's last treatment was on 10/04/2021. Pt has made some lifestyle changes so he feels he does not need to come to his treatment weekly right now and does travel with his job. Pt also lost quite a bit of weight.  Pt will probably come every 3-4 weeks. Spravato is delivered locally by [facility] and stored at [employer] office in a safe behind another locked door, due to being a schedule CIII medication. Pt taken to treatment room. No complaints voiced prior to starting treatment. Initial vital signs assessed at 1:50 PM 143/79, 66. Pt asked to blow his nose and recline his chair to 45 degree angle to begin his treatment. Pt given 1 of the 28 mg dose nasal sprays self administered and nurse observed, with 5 minutes in between all 3 doses until completed. Pt brings head phones and an eye mask, the lights are turned down for comfort. Pt does doze off within 30 minutes but it's just off and on throughout his treatment. Pt does experience dissociation but he is all clear by time of his discharge. Pt's vital signs assessed at 40 minutes, 2:30 PM, 130/77, 65. Pt resting/sleeping until end of treatment then [name] PA-C comes in to discuss his progress and treatment. Discharge vital signs assessed at 4:00 PM, 137/76, 64. Pt was observed a total 120 minutes per FDA/REMS requirements, his symptoms of dissociation and sleepiness are all resolved prior to his discharge. No medications given during his treatment for any symptoms. Nursing assessed pt a total of 45 minutes.Pt will call back to get scheduled, he may start doing every other week depending on how he feels.        LOT [id number] EXP JUL 2024

## 2022-01-17 ENCOUNTER — Telehealth: Payer: Self-pay | Admitting: Medical

## 2022-01-17 ENCOUNTER — Other Ambulatory Visit: Payer: Self-pay | Admitting: Medical

## 2022-01-17 NOTE — Telephone Encounter (Signed)
Opened to review 

## 2022-01-17 NOTE — Telephone Encounter (Addendum)
Refill his bp med for one month. He needs appt. More than a year since last visit. Please call pt to get scheduled.

## 2022-01-18 NOTE — Telephone Encounter (Signed)
I have called the pt and left a message to call back. I have also left a message stating that he will need to make a f/u appointment for additional refills.

## 2022-02-17 ENCOUNTER — Other Ambulatory Visit: Payer: Self-pay | Admitting: Medical

## 2022-02-21 ENCOUNTER — Ambulatory Visit (INDEPENDENT_AMBULATORY_CARE_PROVIDER_SITE_OTHER): Payer: 59 | Admitting: Physician Assistant

## 2022-02-21 ENCOUNTER — Encounter: Payer: Self-pay | Admitting: Physician Assistant

## 2022-02-21 ENCOUNTER — Ambulatory Visit: Payer: 59

## 2022-02-21 VITALS — BP 130/77 | HR 70

## 2022-02-21 DIAGNOSIS — F329 Major depressive disorder, single episode, unspecified: Secondary | ICD-10-CM

## 2022-02-21 DIAGNOSIS — F429 Obsessive-compulsive disorder, unspecified: Secondary | ICD-10-CM | POA: Diagnosis not present

## 2022-02-21 DIAGNOSIS — F902 Attention-deficit hyperactivity disorder, combined type: Secondary | ICD-10-CM

## 2022-02-21 DIAGNOSIS — F338 Other recurrent depressive disorders: Secondary | ICD-10-CM | POA: Diagnosis not present

## 2022-02-21 NOTE — Progress Notes (Signed)
[facility]  Patient ID: Albert [name],  MRN: [id number]  PCP: [name], PA-C  Date of Evaluation: 02/21/2022  time spent:40 minutes  Chief Complaint:  Chief Complaint   Depression; Other    HISTORY/CURRENT STATUS: HPI For Spravato treatment.   Albert [name] is doing pretty well.  Last Spravato treatment about 6 weeks ago.  He responded well to it.  We are trying to preempt the seasonal affective disorder. Patient is able to enjoy things.  Energy and motivation are good.  Work is going well.   No extreme sadness, tearfulness, or feelings of hopelessness.  Sleeps well most of the time. ADLs and personal hygiene are normal.   Appetite has not changed.  Weight is stable.  Denies suicidal or homicidal thoughts.  Not having obsessive thoughts like he has in the past.  No compulsions.  Occasionally he will get anxious if it is warranted but usually not a problem.  The most part attention is good without easy distractibility, he is able to focus on things and finish tasks to completion.  He takes the Adzenys XR ODT only as needed, couple of times a week at the most.  It is more effective that way.  Patient denies increased energy with decreased need for sleep, increased talkativeness, racing thoughts, impulsivity or risky behaviors, increased spending, increased libido, grandiosity, increased irritability or anger, paranoia, or hallucinations.  Review of Systems  Constitutional: Negative.   HENT: Negative.    Eyes: Negative.   Respiratory: Negative.    Cardiovascular: Negative.   Gastrointestinal: Negative.   Genitourinary: Negative.   Musculoskeletal: Negative.   Skin: Negative.   Neurological: Negative.   Endo/Heme/Allergies: Negative.   Psychiatric/Behavioral:         See HPI   Individual Medical History/ Review of Systems: Changes? :No   Past medications for mental health diagnoses include: Luvox, Lunesta, Xanax, Wellbutrin, Risperdal, Prozac, Ambien, Deplin,  Abilify, naltrexone, Adderall, Spravato, Trintellix was helpful for depression but not OCD so it was discontinued.  Allergies: Sulfamethoxazole and Sulfonamide derivatives  Current Medications:  Current Outpatient Medications:    Amphetamine ER (ADZENYS XR-ODT) 6.3 MG TBED, Take 6.3 mg by mouth daily after breakfast. Takes as needed, Disp: 30 tablet, Rfl: 0   Esketamine HCl, 84 MG Dose, (SPRAVATO, 84 MG DOSE,) 28 MG/DEVICE SOPK, Place 84 mg into the nose once a week., Disp: 3 each, Rfl: 3   eszopiclone (LUNESTA) 1 MG TABS tablet, Take 1 tablet (1 mg total) by mouth at bedtime as needed for sleep., Disp: 30 tablet, Rfl: 5   fluvoxaMINE (LUVOX) 100 MG tablet, Take 1 tablet (100 mg total) by mouth at bedtime. (Patient taking differently: Take 100 mg by mouth at bedtime. +25 mg=125 mg.), Disp: 90 tablet, Rfl: 3   fluvoxaMINE (LUVOX) 50 MG tablet, Take 0.5 tablets (25 mg total) by mouth at bedtime. With 100 mg=$RemoveBef'125mg'mWFJTusSGI$ ., Disp: 45 tablet, Rfl: 3   telmisartan-hydrochlorothiazide (MICARDIS HCT) 80-12.5 MG tablet, TAKE 1 TABLET BY MOUTH DAILY, Disp: 30 tablet, Rfl: 0   testosterone cypionate (DEPO-TESTOSTERONE) 100 MG/ML injection, Inject 0.3 mLs (30 mg total) into the muscle once a week. Discard vial 28 days after first use., Disp: 10 mL, Rfl: 0   benzonatate (TESSALON PERLES) 100 MG capsule, 1-2 capsules up to twice daily as needed for cough (Patient not taking: Reported on 06/21/2021), Disp: 30 capsule, Rfl: 0 Medication Side Effects: sexual dysfunction  tolerable   Family Medical/ Social History: Changes? No  MENTAL HEALTH EXAM:  There were  no vitals taken for this visit.There is no height or weight on file to calculate BMI.    General Appearance: Casual and Well Groomed  Eye Contact:  Good  Speech:  Clear and Coherent and Normal Rate  Volume:  Normal  Mood:  Euthymic  Affect:  Appropriate  Thought Process:  Goal Directed and Descriptions of Associations: Circumstantial  Orientation:  Full (Time,  Place, and Person)  Thought Content: Logical   Suicidal Thoughts:  No  Homicidal Thoughts:  No  Memory:  WNL  Judgement:  Good  Insight:  Good  Psychomotor Activity:  Normal  Concentration:  Concentration: Good and Attention Span: Good  Recall:  Good  Fund of Knowledge: Good  Language: Good  Assets:  Desire for Improvement Financial Resources/Insurance Housing Transportation Vocational/Educational  ADL's:  Intact  Cognition: WNL  Prognosis:  Good   ECT-MADRS    Flowsheet Row Clinical Support from 02/21/2022 in [place] from 01/03/2022 in [place] from 10/04/2021 in [place] from 08/30/2021 in [place] Visit from 07/26/2021 in [facility]  MADRS Total Score $RemoveBef'3 7 3 3 9      'jsdZphwicG$ PHQ2-9    [place] from 08/30/2020 in [facility]  PHQ-2 Total Score 6  PHQ-9 Total Score 20      Flowsheet Row Telemedicine from 08/30/2020 in [place] Error: Q3, 4, or 5 should not be populated when Q2 is No      Patient was administered Spravato 84 mg intranasally today.  The patient experienced the typical dissociation which gradually resolved over the 2-hour period of observation.  There were no complications.  Specifically the patient did not have nausea or vomiting or headache.  Blood pressures remained within normal ranges at the 40-minute and 2-hour follow-up intervals.  By the time the 2-hour observation period was met the patient was alert and oriented and able to exit without assistance.  Patient feels the Spravato administration is helpful for the treatment resistant depression and would like to continue the treatment.  See nursing note for further details.  DIAGNOSES:    ICD-10-CM   1. Treatment-resistant depression  F32.9     2. Obsessive-compulsive disorder, unspecified  type  F42.9     3. Attention deficit hyperactivity disorder (ADHD), combined type  F90.2     4. Seasonal affective disorder ([place])  F33.8      Receiving Psychotherapy: No   RECOMMENDATIONS:  PDMP was reviewed.  Spravato filled 08/26/2021.  Adzenys XR ODT filled 02/13/2022.  Lunesta filled 01/08/2022. I provided 40 minutes of face to face time during this encounter, including time spent before and after the visit in records review, medical decision making, counseling pertinent to today's visit, and charting.   We discussed the need to increase the frequency of Spravato treatments now that we are getting into fall.  He always has more depression in the fall and winter so hopefully we can prevent that this year.  He feels like the Spravato has been helpful and would like to continue treatment.  Continue adzenys ER 6.3 mg, 1 p.o. every morning after breakfast. Takes it prn. Continue Spravato 84 mg, due in 1 month. Continue Lunesta 1 mg, 1 p.o. nightly as needed sleep. Continue Luvox 125 mg nightly for OCD and depression.   Continue Ativan 0.5 mg, 1 p.o. twice daily as needed.  He has an old prescription and rarely takes.  Return in 3 weeks for neck Spravato.  [name], PA-C

## 2022-02-24 NOTE — Progress Notes (Signed)
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 Pt's last treatment was on 01/03/2022. Pt has made some lifestyle changes so he feels he does not need to come to his treatment weekly right now and does travel with his job. Pt also lost quite a bit of weight.  Pt will probably come every 3-4 weeks. Spravato is delivered locally by [facility] and stored at [employer] office in a safe behind another locked door, due to being a schedule CIII medication. Pt taken to treatment room. No complaints voiced prior to starting treatment. Initial vital signs assessed at 2:00 PM 116/66, 73. Pt asked to blow his nose and recline his chair to 45 degree angle to begin his treatment. Pt given 1 of the 28 mg dose nasal sprays self administered and nurse observed, with 5 minutes in between all 3 doses until completed. Pt brings head phones and an eye mask, the lights are turned down for comfort. Pt does doze off within 30 minutes but it's just off and on throughout his treatment. Pt does experience dissociation but he is all clear by time of his discharge. Pt's vital signs assessed at 40 minutes, 2:40 PM, 129/77, 64. Pt resting/sleeping until end of treatment then [name] PA-C comes in to discuss his progress and treatment. Discharge vital signs assessed at 4:00 PM, 130/77, 70. Pt was observed a total 120 minutes per FDA/REMS requirements, his symptoms of dissociation and sleepiness are all resolved prior to his discharge. No medications given during his treatment for any symptoms. Nursing assessed pt a total of 45 minutes.Pt asked to return in October 12th.       LOT [id number] EXP JUL 2024

## 2022-03-14 ENCOUNTER — Encounter: Payer: 59 | Admitting: Physician Assistant

## 2022-03-26 ENCOUNTER — Other Ambulatory Visit: Payer: Self-pay | Admitting: Medical

## 2022-04-24 ENCOUNTER — Encounter: Payer: 59 | Admitting: Medical

## 2022-04-30 ENCOUNTER — Encounter: Payer: 59 | Admitting: Medical

## 2022-05-21 ENCOUNTER — Ambulatory Visit (INDEPENDENT_AMBULATORY_CARE_PROVIDER_SITE_OTHER): Payer: 59 | Admitting: Medical

## 2022-05-21 VITALS — BP 136/88 | HR 64 | Temp 98.0°F | Resp 18 | Ht 72.0 in | Wt 193.0 lb

## 2022-05-21 DIAGNOSIS — R7989 Other specified abnormal findings of blood chemistry: Secondary | ICD-10-CM

## 2022-05-21 DIAGNOSIS — Z1211 Encounter for screening for malignant neoplasm of colon: Secondary | ICD-10-CM

## 2022-05-21 DIAGNOSIS — E785 Hyperlipidemia, unspecified: Secondary | ICD-10-CM

## 2022-05-21 DIAGNOSIS — Z23 Encounter for immunization: Secondary | ICD-10-CM

## 2022-05-21 DIAGNOSIS — Z0000 Encounter for general adult medical examination without abnormal findings: Secondary | ICD-10-CM | POA: Diagnosis not present

## 2022-05-21 DIAGNOSIS — I10 Essential (primary) hypertension: Secondary | ICD-10-CM

## 2022-05-21 LAB — CBC WITH DIFFERENTIAL/PLATELET
Basophils Absolute: 0 10*3/uL (ref 0.0–0.1)
Basophils Relative: 0.6 % (ref 0.0–3.0)
Eosinophils Absolute: 0.2 10*3/uL (ref 0.0–0.7)
Eosinophils Relative: 2.8 % (ref 0.0–5.0)
HCT: 48.4 % (ref 39.0–52.0)
Hemoglobin: 16.8 g/dL (ref 13.0–17.0)
Lymphocytes Relative: 24.2 % (ref 12.0–46.0)
Lymphs Abs: 1.5 10*3/uL (ref 0.7–4.0)
MCHC: 34.8 g/dL (ref 30.0–36.0)
MCV: 88.7 fl (ref 78.0–100.0)
Monocytes Absolute: 0.6 10*3/uL (ref 0.1–1.0)
Monocytes Relative: 10.2 % (ref 3.0–12.0)
Neutro Abs: 3.8 10*3/uL (ref 1.4–7.7)
Neutrophils Relative %: 62.2 % (ref 43.0–77.0)
Platelets: 184 10*3/uL (ref 150.0–400.0)
RBC: 5.46 Mil/uL (ref 4.22–5.81)
RDW: 12.8 % (ref 11.5–15.5)
WBC: 6.1 10*3/uL (ref 4.0–10.5)

## 2022-05-21 LAB — COMPREHENSIVE METABOLIC PANEL
ALT: 13 U/L (ref 0–53)
AST: 15 U/L (ref 0–37)
Albumin: 4.6 g/dL (ref 3.5–5.2)
Alkaline Phosphatase: 69 U/L (ref 39–117)
BUN: 17 mg/dL (ref 6–23)
CO2: 29 mEq/L (ref 19–32)
Calcium: 9.3 mg/dL (ref 8.4–10.5)
Chloride: 101 mEq/L (ref 96–112)
Creatinine, Ser: 1.06 mg/dL (ref 0.40–1.50)
GFR: 84.47 mL/min (ref >60.00)
Glucose, Bld: 90 mg/dL (ref 70–99)
Potassium: 4.4 mEq/L (ref 3.5–5.1)
Sodium: 138 mEq/L (ref 135–145)
Total Bilirubin: 0.7 mg/dL (ref 0.2–1.2)
Total Protein: 7.2 g/dL (ref 6.0–8.3)

## 2022-05-21 LAB — LIPID PANEL
Cholesterol: 235 mg/dL — ABNORMAL HIGH (ref 0–200)
HDL: 55.6 mg/dL
LDL Cholesterol: 159 mg/dL — ABNORMAL HIGH (ref 0–99)
NonHDL: 178.95
Total CHOL/HDL Ratio: 4
Triglycerides: 101 mg/dL (ref 0.0–149.0)
VLDL: 20.2 mg/dL (ref 0.0–40.0)

## 2022-05-21 MED ORDER — TELMISARTAN-HCTZ 80-12.5 MG PO TABS: 1.0000 | ORAL_TABLET | Freq: Every day | ORAL | 11 refills | Status: DC

## 2022-05-21 NOTE — Addendum Note (Signed)
Addended by: Maximino Sarin on: 05/21/2022 10:06 AM   Modules accepted: Orders

## 2022-05-21 NOTE — Patient Instructions (Addendum)
For you wellness exam today I have ordered cbc, cmp and lipid panel.  Vaccine today tdap  Referral for colonoscopy placed  Recommend exercise and healthy diet.  We will let you know lab results as they come in.  Follow up date appointment will be determined after lab review.    Htn- refilling your micardis/hctz.  High cholesterol- will follow labs and might rx low dose statin due to risk score and family history.  Low testosterone- urologist managing and following psa.   Preventive Care 46-25 Years Old, Male Preventive care refers to lifestyle choices and visits with your health care provider that can promote health and wellness. Preventive care visits are also called wellness exams. What can I expect for my preventive care visit? Counseling During your preventive care visit, your health care provider may ask about your: Medical history, including: Past medical problems. Family medical history. Current health, including: Emotional well-being. Home life and relationship well-being. Sexual activity. Lifestyle, including: Alcohol, nicotine or tobacco, and drug use. Access to firearms. Diet, exercise, and sleep habits. Safety issues such as seatbelt and bike helmet use. Sunscreen use. Work and work Astronomer. Physical exam Your health care provider will check your: Height and weight. These may be used to calculate your BMI (body mass index). BMI is a measurement that tells if you are at a healthy weight. Waist circumference. This measures the distance around your waistline. This measurement also tells if you are at a healthy weight and may help predict your risk of certain diseases, such as type 2 diabetes and high blood pressure. Heart rate and blood pressure. Body temperature. Skin for abnormal spots. What immunizations do I need?  Vaccines are usually given at various ages, according to a schedule. Your health care provider will recommend vaccines for you based on your  age, medical history, and lifestyle or other factors, such as travel or where you work. What tests do I need? Screening Your health care provider may recommend screening tests for certain conditions. This may include: Lipid and cholesterol levels. Diabetes screening. This is done by checking your blood sugar (glucose) after you have not eaten for a while (fasting). Hepatitis B test. Hepatitis C test. HIV (human immunodeficiency virus) test. STI (sexually transmitted infection) testing, if you are at risk. Lung cancer screening. Prostate cancer screening. Colorectal cancer screening. Talk with your health care provider about your test results, treatment options, and if necessary, the need for more tests. Follow these instructions at home: Eating and drinking  Eat a diet that includes fresh fruits and vegetables, whole grains, lean protein, and low-fat dairy products. Take vitamin and mineral supplements as recommended by your health care provider. Do not drink alcohol if your health care provider tells you not to drink. If you drink alcohol: Limit how much you have to 0-2 drinks a day. Know how much alcohol is in your drink. In the [place] one drink equals one 12 oz bottle of beer (355 mL), one 5 oz glass of wine (148 mL), or one 1 oz glass of hard liquor (44 mL). Lifestyle Brush your teeth every morning and night with fluoride toothpaste. Floss one time each day. Exercise for at least 30 minutes 5 or more days each week. Do not use any products that contain nicotine or tobacco. These products include cigarettes, chewing tobacco, and vaping devices, such as e-cigarettes. If you need help quitting, ask your health care provider. Do not use drugs. If you are sexually active, practice safe sex. Use a  condom or other form of protection to prevent STIs. Take aspirin only as told by your health care provider. Make sure that you understand how much to take and what form to take. Work with your  health care provider to find out whether it is safe and beneficial for you to take aspirin daily. Find healthy ways to manage stress, such as: Meditation, yoga, or listening to music. Journaling. Talking to a trusted person. Spending time with friends and family. Minimize exposure to UV radiation to reduce your risk of skin cancer. Safety Always wear your seat belt while driving or riding in a vehicle. Do not drive: If you have been drinking alcohol. Do not ride with someone who has been drinking. When you are tired or distracted. While texting. If you have been using any mind-altering substances or drugs. Wear a helmet and other protective equipment during sports activities. If you have firearms in your house, make sure you follow all gun safety procedures. What's next? Go to your health care provider once a year for an annual wellness visit. Ask your health care provider how often you should have your eyes and teeth checked. Stay up to date on all vaccines. This information is not intended to replace advice given to you by your health care provider. Make sure you discuss any questions you have with your health care provider. Document Revised: 11/22/2020 Document Reviewed: 11/22/2020 Elsevier Patient Education  2023 [employer].

## 2022-05-21 NOTE — Progress Notes (Signed)
Subjective:    Patient ID: Albert [name], male    DOB: July 30, 1975, 46 y.o.   MRN: [id number]  HPI Pt works in Insurance underwriter.   Wellness exam(I have not seen pt since 11-2020). Walk a lot golfing twice a week.. Diet has been moderate healthy. He drinks 2-3 beers every night. Non smoker. Married- one child.  Pt has not had colonoscopy.  Pt will get tdap today.  Oct of last year started testosterone replacement. Lost 15 lbs when he started testosterone. Rx given by urologist.    Lendell Caprice- on micardis hctz. Bp decently controlled today. He ran out of medication one week ago.   Pt sees psychiatrist PA [name]. Pt states mood is well controlled. Pt is on viibryd. Dx with ocd.   For insomnia on lunesta.    Review of Systems  Constitutional:  Negative for chills, fatigue, fever and unexpected weight change.  HENT:  Negative for congestion.   Respiratory:  Negative for cough, chest tightness, shortness of breath and wheezing.   Cardiovascular:  Negative for chest pain and palpitations.  Gastrointestinal:  Negative for abdominal pain, blood in stool, diarrhea and nausea.  Genitourinary:  Negative for dysuria.  Musculoskeletal:  Negative for back pain.  Neurological:  Negative for dizziness, light-headedness and headaches.  Hematological:  Negative for adenopathy. Does not bruise/bleed easily.  Psychiatric/Behavioral:  Negative for behavioral problems, confusion, dysphoric mood, sleep disturbance and suicidal ideas. The patient is not nervous/anxious.        Mood and anxiety controlled.    Past Medical History:  Diagnosis Date   Attention deficit hyperactivity disorder (ADHD) 06/19/2016   Closed nondisplaced fracture of neck of fifth metacarpal bone of right hand 07/10/2016   CONTACT DERMATITIS&OTHER ECZEMA DUE UNSPEC CAUSE 10/06/2009   Qualifier: Diagnosis of  By: Ronnald Ramp MD, Arvid Right.    Depression    DEPRESSION 02/20/2009   Qualifier: Diagnosis of  By: Ronnald Ramp MD, Arvid Right.     Hyperlipidemia    NECK PAIN, ACUTE 09/13/2009   Qualifier: Diagnosis of  By: Ronnald Ramp MD, Arvid Right.    Thrombosed external hemorrhoid 10/06/2015   Symptoms and exam consistent with thrombosed external hemorrhoid. Patient declines incise and drain at this time. Treat conservatively with hydrocortisone rectal cream and Tylenol 3 as needed for discomfort. Recommend sitz bath. Follow-up if symptoms worsen or do not improve or would like to seek interventional procedure.    TOBACCO USE, QUIT 03/16/2009   Qualifier: Diagnosis of  By: Doralee Albino       Social History   Socioeconomic History   Marital status: Married    Spouse name: Not on file   Number of children: 1   Years of education: 16   Highest education level: Not on file  Occupational History   Occupation: Sales  Tobacco Use   Smoking status: Never   Smokeless tobacco: Never   Tobacco comments:    socially smoked years ago.  Vaping Use   Vaping Use: Never used  Substance and Sexual Activity   Alcohol use: Not Currently    Comment: Stopped 07/11/2021   Drug use: No   Sexual activity: Yes  Other Topics Concern   Not on file  Social History Narrative   Fun: Exercise, play music, sports   Social Determinants of Health   Financial Resource Strain: Not on file  Food Insecurity: Not on file  Transportation Needs: Not on file  Physical Activity: Not on file  Stress: Not on file  Social  Connections: Not on file  Intimate Partner Violence: Not on file    Past Surgical History:  Procedure Laterality Date   HERNIA REPAIR      Family History  Problem Relation Age of Onset   Healthy Mother    Hyperlipidemia Paternal Grandfather    Heart disease Paternal Grandfather    Heart failure Paternal Grandfather    Other Neg Hx        low testosterone    Allergies  Allergen Reactions   Sulfamethoxazole     Other reaction(s): Arthralgias (intolerance)   Sulfonamide Derivatives     REACTION: Rash Stiff joints    Current  Outpatient Medications on File Prior to Visit  Medication Sig Dispense Refill   Amphetamine ER (ADZENYS XR-ODT) 6.3 MG TBED Take 6.3 mg by mouth daily after breakfast. Takes as needed 30 tablet 0   Esketamine HCl, 84 MG Dose, (SPRAVATO, 84 MG DOSE,) 28 MG/DEVICE SOPK Place 84 mg into the nose once a week. 3 each 3   eszopiclone (LUNESTA) 1 MG TABS tablet Take 1 tablet (1 mg total) by mouth at bedtime as needed for sleep. 30 tablet 5   fluvoxaMINE (LUVOX) 100 MG tablet Take 1 tablet (100 mg total) by mouth at bedtime. (Patient taking differently: Take 100 mg by mouth at bedtime. +25 mg=125 mg.) 90 tablet 3   fluvoxaMINE (LUVOX) 50 MG tablet Take 0.5 tablets (25 mg total) by mouth at bedtime. With 100 mg=125mg . 45 tablet 3   telmisartan-hydrochlorothiazide (MICARDIS HCT) 80-12.5 MG tablet TAKE 1 TABLET BY MOUTH DAILY 30 tablet 0   testosterone cypionate (DEPO-TESTOSTERONE) 100 MG/ML injection Inject 0.3 mLs (30 mg total) into the muscle once a week. Discard vial 28 days after first use. 10 mL 0   No current facility-administered medications on file prior to visit.    BP 136/88   Pulse 64   Temp 98 F (36.7 C)   Resp 18   Ht 6' (1.829 m)   Wt 193 lb (87.5 kg)   SpO2 96%   BMI 26.18 kg/m        Objective:   Physical Exam   General Mental Status- Alert. General Appearance- Not in acute distress.   Skin General: Color- Normal Color. Moisture- Normal Moisture.  Neck Carotid Arteries- Normal color. Moisture- Normal Moisture. No carotid bruits. No JVD.  Chest and Lung Exam Auscultation: Breath Sounds:-Normal.  Cardiovascular Auscultation:Rythm- Regular. Murmurs & Other Heart Sounds:Auscultation of the heart reveals- No Murmurs.  Abdomen Inspection:-Inspeection Normal. Palpation/Percussion:Note:No mass. Palpation and Percussion of the abdomen reveal- Non Tender, Non Distended + BS, no rebound or guarding.   Neurologic Cranial Nerve exam:- CN III-XII intact(No nystagmus),  symmetric smile. Strength:- 5/5 equal and symmetric strength both upper and lower extremities.      Assessment & Plan:   Patient Instructions  For you wellness exam today I have ordered cbc, cmp and lipid panel.  Vaccine today tdap  Referral for colonoscopy placed  Recommend exercise and healthy diet.  We will let you know lab results as they come in.  Follow up date appointment will be determined after lab review.    Htn- refilling your micardis/hctz.  High cholesterol- will follow labs and might rx low dose statin due to risk score and family history.  Low testosterone- urologist managing and following psa.   [id number] charge as well. Addressed and reviewed htn and discussed potential plan for high cholesterol. Possible statin use. Note not seen in office since 11-2020.

## 2022-05-22 MED ORDER — ROSUVASTATIN CALCIUM 10 MG PO TABS: ORAL_TABLET | ORAL | 0 refills | Status: DC

## 2022-05-22 NOTE — Addendum Note (Signed)
Addended by: Gwenevere Abbot on: 05/22/2022 07:35 PM   Modules accepted: Orders

## 2022-06-11 ENCOUNTER — Telehealth: Payer: Self-pay

## 2022-06-11 NOTE — Telephone Encounter (Signed)
Noted  

## 2022-06-11 NOTE — Telephone Encounter (Signed)
Prior Authorization initiated for Spravato 84 mg with [facility]. Pending response.   Pt requesting to return to treatment this week. [place] notified.

## 2022-06-11 NOTE — Telephone Encounter (Signed)
Prior Approval received for Spravato 84 mg effective through 12/10/2022, PA # [id number]  Will contact pt and set treatment for next week per provider.

## 2022-06-12 ENCOUNTER — Ambulatory Visit: Payer: 59

## 2022-06-12 ENCOUNTER — Encounter: Payer: Self-pay | Admitting: Physician Assistant

## 2022-06-12 ENCOUNTER — Ambulatory Visit (INDEPENDENT_AMBULATORY_CARE_PROVIDER_SITE_OTHER): Payer: 59 | Admitting: Physician Assistant

## 2022-06-12 VITALS — BP 139/91 | HR 70

## 2022-06-12 DIAGNOSIS — F338 Other recurrent depressive disorders: Secondary | ICD-10-CM

## 2022-06-12 DIAGNOSIS — F329 Major depressive disorder, single episode, unspecified: Secondary | ICD-10-CM

## 2022-06-12 DIAGNOSIS — F902 Attention-deficit hyperactivity disorder, combined type: Secondary | ICD-10-CM

## 2022-06-12 DIAGNOSIS — F429 Obsessive-compulsive disorder, unspecified: Secondary | ICD-10-CM

## 2022-06-12 DIAGNOSIS — F411 Generalized anxiety disorder: Secondary | ICD-10-CM

## 2022-06-12 NOTE — Progress Notes (Signed)
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 Pt's last treatment was on 02/21/2022. Pt has made some lifestyle changes so he feels he does not need to come to his treatment weekly right now and does travel with his job. Pt also lost quite a bit of weight.  Pt will probably come every 3-4 weeks. Spravato is delivered locally by [facility] and stored at [employer] office in a safe behind another locked door, due to being a schedule CIII medication.   Pt taken to treatment room. No complaints voiced prior to starting treatment. Initial vital signs assessed at 10:05 AM 142/85, 69. Pt asked to blow his nose and recline his chair to 45 degree angle to begin his treatment. Pt given 1 of the 28 mg dose nasal sprays self administered and nurse observed, with 5 minutes in between all 3 doses until completed. Pt brings head phones and an eye mask, the lights are turned down for comfort. Pt does doze off within 30 minutes but it's just off and on throughout his treatment. Pt does experience dissociation but he is all clear by time of his discharge. Pt's vital signs assessed at 40 minutes, 10:55 AM, 146/92, 65. Pt resting/sleeping until end of treatment then [name] PA-C comes in to discuss his progress and treatment. Discharge vital signs assessed at 12:00 PM, 139/91, 70. Pt was observed a total 120 minutes per FDA/REMS requirements, his symptoms of dissociation and sleepiness are all resolved prior to his discharge. No medications given during his treatment for any symptoms. Nursing assessed pt a total of 45 minutes.Pt asked to return in two weeks.       LOT [id number] EXP JUL 2024

## 2022-06-12 NOTE — Progress Notes (Signed)
[facility]  Patient ID: Albert [name],  MRN: [id number]  PCP: [name], PA-C  Date of Evaluation: 06/12/2022 Time spent:40 minutes  Chief Complaint:  Chief Complaint   Other; Depression    HISTORY/CURRENT STATUS: HPI For Spravato treatment.   He has not had a Spravato treatment in several months.  He has seasonal affective disorder and treatment resistant depression, responding well to the Spravato as needed.  Last winter he took Spravato for several months and it was helpful.  He usually starts feeling depressed in January and it can last through March.  He feels in a low mood.  Energy and motivation are decreased although not affecting work or his home life too much.  He is sleeping well.  Appetite is normal and weight is stable.  ADLs and personal hygiene are normal.  He is not isolating.  Most of the time he is able to focus and stay on task.  He takes a stimulant as needed.  Anxiety is usually not a problem.  He still has obsessions at times but they do not control him.  Same with compulsions.  No suicidal or homicidal thoughts.  Patient denies increased energy with decreased need for sleep, increased talkativeness, racing thoughts, impulsivity or risky behaviors, increased spending, increased libido, grandiosity, increased irritability or anger, paranoia, or hallucinations.  Review of Systems  Constitutional: Negative.   HENT: Negative.    Eyes: Negative.   Respiratory: Negative.    Cardiovascular: Negative.   Gastrointestinal: Negative.   Genitourinary: Negative.   Musculoskeletal: Negative.   Skin: Negative.   Neurological: Negative.   Endo/Heme/Allergies: Negative.   Psychiatric/Behavioral:         See HPI   Individual Medical History/ Review of Systems: Changes? :No   Past medications for mental health diagnoses include: Luvox, Lunesta, Xanax, Wellbutrin, Risperdal, Prozac, Ambien, Deplin, Abilify, naltrexone, Adderall, Spravato, Trintellix was  helpful for depression but not OCD so it was discontinued.  Allergies: Sulfamethoxazole and Sulfonamide derivatives  Current Medications:  Current Outpatient Medications:    Amphetamine ER (ADZENYS XR-ODT) 6.3 MG TBED, Take 6.3 mg by mouth daily after breakfast. Takes as needed, Disp: 30 tablet, Rfl: 0   Esketamine HCl, 84 MG Dose, (SPRAVATO, 84 MG DOSE,) 28 MG/DEVICE SOPK, Place 84 mg into the nose once a week., Disp: 3 each, Rfl: 3   eszopiclone (LUNESTA) 1 MG TABS tablet, Take 1 tablet (1 mg total) by mouth at bedtime as needed for sleep., Disp: 30 tablet, Rfl: 5   fluvoxaMINE (LUVOX) 100 MG tablet, Take 1 tablet (100 mg total) by mouth at bedtime. (Patient taking differently: Take 100 mg by mouth at bedtime. +25 mg=125 mg.), Disp: 90 tablet, Rfl: 3   fluvoxaMINE (LUVOX) 50 MG tablet, Take 0.5 tablets (25 mg total) by mouth at bedtime. With 100 mg=169m., Disp: 45 tablet, Rfl: 3   rosuvastatin (CRESTOR) 10 MG tablet, 1 tab po Monday, Wednesday and Friday., Disp: 36 tablet, Rfl: 0   telmisartan-hydrochlorothiazide (MICARDIS HCT) 80-12.5 MG tablet, Take 1 tablet by mouth daily., Disp: 30 tablet, Rfl: 11   testosterone cypionate (DEPO-TESTOSTERONE) 100 MG/ML injection, Inject 0.3 mLs (30 mg total) into the muscle once a week. Discard vial 28 days after first use., Disp: 10 mL, Rfl: 0 Medication Side Effects: sexual dysfunction  tolerable   Family Medical/ Social History: Changes? No  MENTAL HEALTH EXAM:   There were no vitals taken for this visit.There is no height or weight on file to calculate BMI.  General  Appearance: Casual and Well Groomed  Eye Contact:  Good  Speech:  Clear and Coherent and Normal Rate  Volume:  Normal  Mood:   sad  Affect:  Congruent  Thought Process:  Goal Directed and Descriptions of Associations: Circumstantial  Orientation:  Full (Time, Place, and Person)  Thought Content: Logical   Suicidal Thoughts:  No  Homicidal Thoughts:  No  Memory:  WNL  Judgement:   Good  Insight:  Good  Psychomotor Activity:  Normal  Concentration:  Concentration: Good and Attention Span: Good  Recall:  Good  Fund of Knowledge: Good  Language: Good  Assets:  Desire for Improvement Financial Resources/Insurance Housing Resilience Transportation Vocational/Educational  ADL's:  Intact  Cognition: WNL  Prognosis:  Good   Patient was administered Spravato 84 mg intranasally today.  The patient experienced the typical dissociation which gradually resolved over the 2-hour period of observation.  There were no complications.  Specifically the patient did not have nausea or vomiting or headache.  Blood pressures remained within normal ranges at the 40-minute and 2-hour follow-up intervals.  By the time the 2-hour observation period was met the patient was alert and oriented and able to exit without assistance.  Patient feels the Spravato administration is helpful for the treatment resistant depression and would like to continue the treatment.  See nursing note for further details.  ECT-MADRS    Flowsheet Row Clinical Support from 06/12/2022 in [place] from 02/21/2022 in [place] from 01/03/2022 in [place] from 10/04/2021 in [place] Visit from 08/30/2021 in [facility]  MADRS Total Score _0 PHQ2-9    Flowsheet Row Telemedicine from 08/30/2020 in [facility]  PHQ-2 Total Score 6  PHQ-9 Total Score 20      Flowsheet Row Telemedicine from 08/30/2020 in [place] Error: Q3, 4, or 5 should not be populated when Q2 is No      DIAGNOSES:    ICD-10-CM   1. Treatment-resistant depression  F32.9     2. Obsessive-compulsive disorder, unspecified type  F42.9     3. Attention deficit hyperactivity disorder (ADHD), combined type  F90.2     4. Seasonal  affective disorder ([place])  F33.8     5. Generalized anxiety disorder  F41.1      Receiving Psychotherapy: No   RECOMMENDATIONS:  PDMP was reviewed.  Spravato last filled 07/18/2021.  Last adzenys XR 06/28/2021.   I provided 40 minutes of face to face time during this encounter, including time spent before and after the visit in records review, medical decision making, counseling pertinent to today's visit, and charting.   He responded well to the Spravato in the past so we are restarting it today and we will plan for every 2 weeks for the next few months, depending on how he responds.   Continue adzenys ER 6.3 mg, 1 p.o. every morning after breakfast. Takes it prn. Restart Spravato 84 mg weekly.  Continue Ativan 0.5 mg, 1 p.o. twice daily as needed.  He has an old prescription and rarely takes.   Continue Luvox 125 mg, 1 p.o. nightly for OCD and depression.   Return in 2 weeks.  (He prefers not to restart at twice a week or once a week but knows those are options if needed.)  [name], PA-C

## 2022-06-25 ENCOUNTER — Other Ambulatory Visit: Payer: Self-pay

## 2022-06-25 MED ORDER — SPRAVATO (84 MG DOSE) 28 MG/DEVICE NA SOPK: 84.0000 mg | PACK | NASAL | 3 refills | Status: DC

## 2022-06-27 ENCOUNTER — Ambulatory Visit (INDEPENDENT_AMBULATORY_CARE_PROVIDER_SITE_OTHER): Payer: 59 | Admitting: Physician Assistant

## 2022-06-27 ENCOUNTER — Ambulatory Visit: Payer: 59

## 2022-06-27 VITALS — BP 138/80 | HR 66

## 2022-06-27 DIAGNOSIS — G4700 Insomnia, unspecified: Secondary | ICD-10-CM | POA: Diagnosis not present

## 2022-06-27 DIAGNOSIS — F329 Major depressive disorder, single episode, unspecified: Secondary | ICD-10-CM

## 2022-06-27 DIAGNOSIS — F429 Obsessive-compulsive disorder, unspecified: Secondary | ICD-10-CM | POA: Diagnosis not present

## 2022-06-27 DIAGNOSIS — F338 Other recurrent depressive disorders: Secondary | ICD-10-CM | POA: Diagnosis not present

## 2022-06-27 NOTE — Progress Notes (Signed)
[facility]  Patient ID: Albert [name],  MRN: [id number]  PCP: Albert Pai, PA-C  Date of Evaluation: 06/27/2022 Time spent:40 minutes  Chief Complaint:  Chief Complaint   Depression; Other    HISTORY/CURRENT STATUS: HPI For Spravato treatment.   Doing well. Depression isn't as bad this winter as it has been in the past. Feels like the Spravato as needed helps a lot. Patient is able to enjoy things.  Energy and motivation are good.  Work is going well.   No extreme sadness, tearfulness, or feelings of hopelessness.  Sleeps well but does have to take Lunesta.  ADLs and personal hygiene are normal.   Denies any changes in concentration, making decisions, or remembering things.  Appetite has not changed.  Weight is stable.  Denies suicidal or homicidal thoughts.  Does get anxious at times, depending on the circumstances.  No PA. Has had more ruminating thoughts recently, but not daily. He took Luvox 150 mg a few days (instead of 125 mg) and that seemed to help. The higher the dose, the more he has sexual SE though, prefers not to stay on the higher dose unless he absolutely needs it.   Review of Systems  Constitutional: Negative.   HENT: Negative.    Eyes: Negative.   Respiratory: Negative.    Cardiovascular: Negative.   Gastrointestinal: Negative.   Genitourinary: Negative.   Musculoskeletal: Negative.   Skin: Negative.   Neurological: Negative.   Endo/Heme/Allergies: Negative.   Psychiatric/Behavioral:         See HPI   Individual Medical History/ Review of Systems: Changes? :No   Past medications for mental health diagnoses include: Luvox, Lunesta, Xanax, Wellbutrin, Risperdal, Prozac, Ambien, Deplin, Abilify, naltrexone, Adderall, Spravato, Trintellix was helpful for depression but not OCD so it was discontinued.  Allergies: Sulfamethoxazole and Sulfonamide derivatives  Current Medications:  Current Outpatient Medications:    Esketamine HCl, 84 MG Dose,  (SPRAVATO, 84 MG DOSE,) 28 MG/DEVICE SOPK, Place 84 mg into the nose once a week., Disp: 3 each, Rfl: 3   eszopiclone (LUNESTA) 1 MG TABS tablet, Take 1 tablet (1 mg total) by mouth at bedtime as needed for sleep., Disp: 30 tablet, Rfl: 5   fluvoxaMINE (LUVOX) 100 MG tablet, Take 1 tablet (100 mg total) by mouth at bedtime. (Patient taking differently: Take 100 mg by mouth at bedtime. +25 mg=125 mg.), Disp: 90 tablet, Rfl: 3   fluvoxaMINE (LUVOX) 50 MG tablet, Take 0.5 tablets (25 mg total) by mouth at bedtime. With 100 mg=125mg ., Disp: 45 tablet, Rfl: 3   rosuvastatin (CRESTOR) 10 MG tablet, 1 tab po Monday, Wednesday and Friday., Disp: 36 tablet, Rfl: 0   telmisartan-hydrochlorothiazide (MICARDIS HCT) 80-12.5 MG tablet, Take 1 tablet by mouth daily., Disp: 30 tablet, Rfl: 11   testosterone cypionate (DEPO-TESTOSTERONE) 100 MG/ML injection, Inject 0.3 mLs (30 mg total) into the muscle once a week. Discard vial 28 days after first use., Disp: 10 mL, Rfl: 0   Amphetamine ER (ADZENYS XR-ODT) 6.3 MG TBED, Take 6.3 mg by mouth daily after breakfast. Takes as needed (Patient not taking: Reported on 06/28/2022), Disp: 30 tablet, Rfl: 0 Medication Side Effects: sexual dysfunction  tolerable   Family Medical/ Social History: Changes? No  MENTAL HEALTH EXAM:   There were no vitals taken for this visit.There is no height or weight on file to calculate BMI.  General Appearance: Casual and Well Groomed  Eye Contact:  Good  Speech:  Clear and Coherent and Normal Rate  Volume:  Normal  Mood:  Euthymic  Affect:  Congruent  Thought Process:  Goal Directed and Descriptions of Associations: Circumstantial  Orientation:  Full (Time, Place, and Person)  Thought Content: Logical   Suicidal Thoughts:  No  Homicidal Thoughts:  No  Memory:  WNL  Judgement:  Good  Insight:  Good  Psychomotor Activity:  Normal  Concentration:  Concentration: Good and Attention Span: Good  Recall:  Good  Fund of Knowledge: Good   Language: Good  Assets:  Desire for Improvement Financial Resources/Insurance Housing Resilience Transportation Vocational/Educational  ADL's:  Intact  Cognition: WNL  Prognosis:  Good   Patient was administered Spravato 84 mg intranasally today.  The patient experienced the typical dissociation which gradually resolved over the 2-hour period of observation.  There were no complications.  Specifically the patient did not have nausea or vomiting or headache.  Blood pressures remained within normal ranges at the 40-minute and 2-hour follow-up intervals.  By the time the 2-hour observation period was met the patient was alert and oriented and able to exit without assistance.  Patient feels the Spravato administration is helpful for the treatment resistant depression and would like to continue the treatment.  See nursing note for further details.  ECT-MADRS    Flowsheet Row Clinical Support from 06/27/2022 in [place] from 06/12/2022 in [place] from 02/21/2022 in [place] from 01/03/2022 in [place] from 10/04/2021 in [facility]  MADRS Total Score 14 24 3 7 3       PHQ2-9    Flowsheet Row Telemedicine from 08/30/2020 in [facility]  PHQ-2 Total Score 6  PHQ-9 Total Score 20      Flowsheet Row Telemedicine from 08/30/2020 in [place] Error: Q3, 4, or 5 should not be populated when Q2 is No      DIAGNOSES:    ICD-10-CM   1. Treatment-resistant depression  F32.9     2. Obsessive-compulsive disorder, unspecified type  F42.9     3. Seasonal affective disorder ([place])  F33.8     4. Insomnia, unspecified type  G47.00       Receiving Psychotherapy: No   RECOMMENDATIONS:  PDMP was reviewed.  Spravato last filled last Lunesta filled 06/04/2022. I provided 40 minutes of  face to face time during this encounter, including time spent before and after the visit in records review, medical decision making, counseling pertinent to today's visit, and charting.   He is doing well overall so no changes will be made.  It is fine if he needs to increase the Luvox, daily or as needed.  It is not a usual practice however it is safe.   Continue adzenys ER 6.3 mg, 1 p.o. every morning after breakfast. Takes it prn. Continue Spravato 84 mg every 2 to 3 weeks as needed. Continue Ativan 0.5 mg, 1 p.o. twice daily as needed.  He has an old prescription and rarely takes.   Continue Luvox 125 mg, 1 p.o. nightly for OCD and depression.  Okay to increase to 150 mg if needed, he knows when he should increase. Return in 2-3 weeks.    [name], PA-C

## 2022-06-28 ENCOUNTER — Encounter: Payer: Self-pay | Admitting: Physician Assistant

## 2022-06-29 ENCOUNTER — Other Ambulatory Visit: Payer: Self-pay | Admitting: Physician Assistant

## 2022-07-03 NOTE — Progress Notes (Signed)
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 Pt's last treatment was on 02/21/2022. Pt has made some lifestyle changes so he feels he does not need to come to his treatment weekly right now and does travel with his job. Pt also lost quite a bit of weight.  Pt will probably come every 3-4 weeks. Spravato is delivered locally by [facility] and stored at [employer] office in a safe behind another locked door, due to being a schedule CIII medication.    Pt taken to treatment room. No complaints voiced prior to starting treatment. Initial vital signs assessed at 2:10 PM 143/82, 69. Pt asked to blow his nose and recline his chair to 45 degree angle to begin his treatment. Pt given 1 of the 28 mg dose nasal sprays self administered and nurse observed, with 5 minutes in between all 3 doses until completed. Pt brings head phones and an eye mask, the lights are turned down for comfort. Pt does doze off within 30 minutes but it's just off and on throughout his treatment. Pt does experience dissociation but he is all clear by time of his discharge. Pt's vital signs assessed at 40 minutes, 3:00 PM, 134/72, 64. Pt resting/sleeping until end of treatment then [name] PA-C comes in to discuss his progress and treatment. Discharge vital signs assessed at 4:05 PM, 138/80, 66. Pt was observed a total 120 minutes per FDA/REMS requirements, his symptoms of dissociation and sleepiness are all resolved prior to his discharge. No medications given during his treatment for any symptoms. Nursing assessed pt a total of 45 minutes.Pt asked to call nurse for next apt.        LOT [id number] EXP JUL 2024

## 2022-07-08 ENCOUNTER — Other Ambulatory Visit: Payer: Self-pay

## 2022-07-08 ENCOUNTER — Telehealth: Payer: Self-pay | Admitting: Medical

## 2022-07-08 MED ORDER — ROSUVASTATIN CALCIUM 10 MG PO TABS: 10.0000 mg | ORAL_TABLET | Freq: Every day | ORAL | 0 refills | Status: AC

## 2022-07-08 MED ORDER — FLUVOXAMINE MALEATE 50 MG PO TABS: 25.0000 mg | ORAL_TABLET | Freq: Every day | ORAL | 3 refills | Status: DC

## 2022-07-08 NOTE — Addendum Note (Signed)
Addended by: Anabel Halon on: 07/08/2022 01:06 PM   Modules accepted: Orders

## 2022-07-08 NOTE — Telephone Encounter (Signed)
Patient states he's had trouble with rosuvastatin (CRESTOR) 10 MG tablet  before so he would like to take the Zetia instead. Please advise.

## 2022-07-10 ENCOUNTER — Encounter: Payer: 59 | Admitting: Physician Assistant

## 2022-07-10 MED ORDER — EZETIMIBE 10 MG PO TABS: 10.0000 mg | ORAL_TABLET | Freq: Every day | ORAL | 3 refills | Status: DC

## 2022-07-10 NOTE — Addendum Note (Signed)
Addended by: Anabel Halon on: 07/10/2022 05:15 PM   Modules accepted: Orders

## 2022-07-10 NOTE — Telephone Encounter (Signed)
Patient called to follow up whether Albert [name] refilled his Zetia since he is having issues with Crestor. Looks like Crestor was accidentally resent instead of the medication he is requesting. Please send to [facility] on [address]

## 2022-07-10 NOTE — Telephone Encounter (Signed)
Please call patient to advise when new medication sent.

## 2022-07-11 ENCOUNTER — Encounter: Payer: Self-pay | Admitting: Physician Assistant

## 2022-07-11 ENCOUNTER — Ambulatory Visit (INDEPENDENT_AMBULATORY_CARE_PROVIDER_SITE_OTHER): Payer: 59 | Admitting: Physician Assistant

## 2022-07-11 ENCOUNTER — Ambulatory Visit: Payer: 59

## 2022-07-11 VITALS — BP 130/80 | HR 62

## 2022-07-11 DIAGNOSIS — F329 Major depressive disorder, single episode, unspecified: Secondary | ICD-10-CM

## 2022-07-11 DIAGNOSIS — G4700 Insomnia, unspecified: Secondary | ICD-10-CM

## 2022-07-11 DIAGNOSIS — F338 Other recurrent depressive disorders: Secondary | ICD-10-CM

## 2022-07-11 DIAGNOSIS — F429 Obsessive-compulsive disorder, unspecified: Secondary | ICD-10-CM | POA: Diagnosis not present

## 2022-07-11 NOTE — Telephone Encounter (Signed)
Left detailed message on machine that rx was sent in. 

## 2022-07-11 NOTE — Progress Notes (Signed)
[facility]  Patient ID: Albert [name],  MRN: [id number]  PCP: Albert Pai, PA-C  Date of Evaluation: 07/11/2022 Time spent:40 minutes  Chief Complaint:  Chief Complaint   Depression; Other    HISTORY/CURRENT STATUS: HPI For Spravato treatment.   Last Spravato was 2 weeks ago.  He is able to tell a difference and that over the past few days he was feeling more down, a little melancholy.  But in general he is doing well.  Able to enjoy things.  Energy and motivation are good.  ADLs and personal hygiene are normal.  Appetite is normal and weight is stable.  Not crying easily.  Work is going fine. Sleeps well.  No anxiety to speak of.  No suicidal or homicidal thoughts.  Patient denies increased energy with decreased need for sleep, increased talkativeness, racing thoughts, impulsivity or risky behaviors, increased spending, increased libido, grandiosity, increased irritability or anger, paranoia, or hallucinations.  Review of Systems  Constitutional: Negative.   HENT: Negative.    Eyes: Negative.   Respiratory: Negative.    Cardiovascular: Negative.   Gastrointestinal: Negative.   Genitourinary: Negative.   Musculoskeletal: Negative.   Skin: Negative.   Neurological: Negative.   Endo/Heme/Allergies: Negative.   Psychiatric/Behavioral:         See HPI   Individual Medical History/ Review of Systems: Changes? :No   Past medications for mental health diagnoses include: Luvox, Lunesta, Xanax, Wellbutrin, Risperdal, Prozac, Ambien, Deplin, Abilify, naltrexone, Adderall, Spravato, Trintellix was helpful for depression but not OCD so it was discontinued.  Allergies: Sulfamethoxazole and Sulfonamide derivatives  Current Medications:  Current Outpatient Medications:    Esketamine HCl, 84 MG Dose, (SPRAVATO, 84 MG DOSE,) 28 MG/DEVICE SOPK, Place 84 mg into the nose once a week., Disp: 3 each, Rfl: 3   eszopiclone (LUNESTA) 1 MG TABS tablet, Take 1 tablet (1 mg total)  by mouth at bedtime as needed for sleep., Disp: 30 tablet, Rfl: 5   ezetimibe (ZETIA) 10 MG tablet, Take 1 tablet (10 mg total) by mouth daily., Disp: 90 tablet, Rfl: 3   fluvoxaMINE (LUVOX) 100 MG tablet, Take 1 tablet (100 mg total) by mouth at bedtime. +25 mg=125 mg., Disp: 90 tablet, Rfl: 0   fluvoxaMINE (LUVOX) 50 MG tablet, Take 0.5 tablets (25 mg total) by mouth at bedtime. With 100 mg=125mg ., Disp: 45 tablet, Rfl: 3   rosuvastatin (CRESTOR) 10 MG tablet, Take 1 tablet (10 mg total) by mouth daily., Disp: 45 tablet, Rfl: 0   telmisartan-hydrochlorothiazide (MICARDIS HCT) 80-12.5 MG tablet, Take 1 tablet by mouth daily., Disp: 30 tablet, Rfl: 11   testosterone cypionate (DEPO-TESTOSTERONE) 100 MG/ML injection, Inject 0.3 mLs (30 mg total) into the muscle once a week. Discard vial 28 days after first use., Disp: 10 mL, Rfl: 0   Amphetamine ER (ADZENYS XR-ODT) 6.3 MG TBED, Take 6.3 mg by mouth daily after breakfast. Takes as needed (Patient not taking: Reported on 06/28/2022), Disp: 30 tablet, Rfl: 0 Medication Side Effects: sexual dysfunction  tolerable   Family Medical/ Social History: Changes? No  MENTAL HEALTH EXAM:   There were no vitals taken for this visit.There is no height or weight on file to calculate BMI.  General Appearance: Casual and Well Groomed  Eye Contact:  Good  Speech:  Clear and Coherent and Normal Rate  Volume:  Normal  Mood:  Euthymic  Affect:  Congruent  Thought Process:  Goal Directed and Descriptions of Associations: Circumstantial  Orientation:  Full (Time, Place,  and Person)  Thought Content: Logical   Suicidal Thoughts:  No  Homicidal Thoughts:  No  Memory:  WNL  Judgement:  Good  Insight:  Good  Psychomotor Activity:  Normal  Concentration:  Concentration: Good and Attention Span: Good  Recall:  Good  Fund of Knowledge: Good  Language: Good  Assets:  Desire for Improvement Financial Resources/Insurance Housing Physical  Health Resilience Transportation Vocational/Educational  ADL's:  Intact  Cognition: WNL  Prognosis:  Good   Patient was administered Spravato 84 mg intranasally today.  The patient experienced the typical dissociation which gradually resolved over the 2-hour period of observation.  There were no complications.  Specifically the patient did not have nausea or vomiting or headache.  Blood pressures remained within normal ranges at the 40-minute and 2-hour follow-up intervals.  By the time the 2-hour observation period was met the patient was alert and oriented and able to exit without assistance.  Patient feels the Spravato administration is helpful for the treatment resistant depression and would like to continue the treatment.  See nursing note for further details.  ECT-MADRS    Flowsheet Row Clinical Support from 06/27/2022 in [place] from 06/12/2022 in [place] from 02/21/2022 in [place] from 01/03/2022 in [place] from 10/04/2021 in [facility]  MADRS Total Score 14 24 3 7 3       PHQ2-9    Flowsheet Row Telemedicine from 08/30/2020 in [facility]  PHQ-2 Total Score 6  PHQ-9 Total Score 20      Flowsheet Row Telemedicine from 08/30/2020 in [place] Error: Q3, 4, or 5 should not be populated when Q2 is No      DIAGNOSES:    ICD-10-CM   1. Treatment-resistant depression  F32.9     2. Obsessive-compulsive disorder, unspecified type  F42.9     3. Seasonal affective disorder ([place])  F33.8     4. Insomnia, unspecified type  G47.00      Receiving Psychotherapy: No   RECOMMENDATIONS:  PDMP was reviewed.   Lunesta filled 06/29/2022.  Also getting Spravato. I provided 40 minutes of  face to face time during this encounter, including time spent before and after the visit in records review, medical decision making, counseling pertinent to today's visit, and charting.   [name] is doing well and continues to respond to the Spravato.  Will continue bimonthly treatments.  Continue Spravato 84 mg every 2 to 3 weeks as needed. Continue Lunesta 1 mg, 1 p.o. nightly as needed sleep. Continue Luvox 125 mg, 1 p.o. nightly for OCD and depression.  Okay to increase to 150 mg if needed, he knows when he should increase. Return in 2 weeks.    [name], PA-C

## 2022-07-16 ENCOUNTER — Telehealth: Payer: Self-pay | Admitting: Medical

## 2022-07-16 NOTE — Telephone Encounter (Signed)
Pt was wanting to get imaging ordered to check for plaque buildup.

## 2022-07-16 NOTE — Addendum Note (Signed)
Addended by: Anabel Halon on: 07/16/2022 09:41 PM   Modules accepted: Orders

## 2022-07-17 NOTE — Telephone Encounter (Signed)
Pt called , virtual visit scheduled and transferred patient to radiology to schedule an appt

## 2022-07-18 ENCOUNTER — Telehealth (INDEPENDENT_AMBULATORY_CARE_PROVIDER_SITE_OTHER): Payer: 59 | Admitting: Medical

## 2022-07-18 ENCOUNTER — Other Ambulatory Visit (HOSPITAL_BASED_OUTPATIENT_CLINIC_OR_DEPARTMENT_OTHER): Payer: 59

## 2022-07-18 DIAGNOSIS — Z8249 Family history of ischemic heart disease and other diseases of the circulatory system: Secondary | ICD-10-CM

## 2022-07-18 DIAGNOSIS — E785 Hyperlipidemia, unspecified: Secondary | ICD-10-CM

## 2022-07-18 NOTE — Patient Instructions (Addendum)
For hyperlipidemia and family history rx'd zetia. Crestor dc'd due to side effects.  Carotid doppler ordered to assess since you expressed severe concern. Also discussion decided on order CT calcium score. Placed order and radiology will try to schedule tomorrow.  After review of results make decision on referring to cardiologist.  Follow up date to be determined.

## 2022-07-18 NOTE — Progress Notes (Signed)
   Subjective:    Patient ID: Albert [name], male    DOB: May 07, 1976, 47 y.o.   MRN: [id number]  HPI  Virtual Visit via Video Note  I connected with Albert [name] on 07/18/22 at  1:20 PM EST by a video enabled telemedicine application and verified that I am speaking with the correct person using two identifiers.  Location: Patient: home Provider: office   I discussed the limitations of evaluation and management by telemedicine and the availability of in person appointments. The patient expressed understanding and agreed to proceed.  History of Present Illness:  Pt states he recently restarted crestor. Pt states he had side effects mood slight worse, gerd  and feeling tired.   Pt is willing to start zetia. Pt had ldl elevation for 5 years.  The 10-year ASCVD risk score (Arnett DK, et al., 2019) is: 6.6%   Values used to calculate the score:     Age: 61 years     Sex: Male     Is Non-Hispanic African American: No     Diabetic: Yes     Tobacco smoker: No     Systolic Blood Pressure: 809 mmHg     Is BP treated: Yes     HDL Cholesterol: 55.6 mg/dL     Total Cholesterol: 235 mg/dL    Pt got opinion from friends dad who is MD who suggested further imaging. Pt dad has stents. He got those at 78 years of age was non smoker..  Pt mom also has high cholesterol    Observations/Objective: General-no acute distress, pleasant, oriented. Lungs- on inspection lungs appear unlabored. Neck- no tracheal deviation or jvd on inspection. Neuro- gross motor function appears intact.   Assessment and Plan: Patient Instructions  For hyperlipidemia and family history rx'd zetia. Crestor dc'd due to side effects.  Carotid doppler ordered to assess since you expressed severe concern. Also discussion decided on order CT calcium score. Placed order and radiology will try to schedule tomorrow.  After review of results make decision on referring to cardiologist.  Follow up date to be determined.    [name], PA-C   Follow Up Instructions:    I discussed the assessment and treatment plan with the patient. The patient was provided an opportunity to ask questions and all were answered. The patient agreed with the plan and demonstrated an understanding of the instructions.   The patient was advised to call back or seek an in-person evaluation if the symptoms worsen or if the condition fails to improve as anticipated.     [name], PA-C   Review of Systems     Objective:   Physical Exam        Assessment & Plan:

## 2022-07-19 ENCOUNTER — Ambulatory Visit (HOSPITAL_BASED_OUTPATIENT_CLINIC_OR_DEPARTMENT_OTHER)
Admission: RE | Admit: 2022-07-19 | Discharge: 2022-07-19 | Disposition: A | Discharge status: Home / Self Care | Payer: 59 | Source: Ambulatory Visit | Attending: Medical | Admitting: Medical

## 2022-07-19 DIAGNOSIS — I10 Essential (primary) hypertension: Secondary | ICD-10-CM | POA: Diagnosis not present

## 2022-07-19 DIAGNOSIS — E785 Hyperlipidemia, unspecified: Secondary | ICD-10-CM

## 2022-07-19 DIAGNOSIS — Z8249 Family history of ischemic heart disease and other diseases of the circulatory system: Secondary | ICD-10-CM

## 2022-07-20 NOTE — Progress Notes (Signed)
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 Pt's last treatment was on 06/27/2022. Spravato is delivered locally by [facility] and stored at [employer] office in a safe behind another locked door, due to being a schedule CIII medication.    Pt taken to treatment room. No complaints voiced prior to starting treatment. Initial vital signs assessed at 8:55 AM 135/81, 60. Pt asked to blow his nose and recline his chair to 45 degree angle to begin his treatment. Pt given 1 of the 28 mg dose nasal sprays self administered and nurse observed, with 5 minutes in between all 3 doses until completed. Pt brings head phones and an eye mask, the lights are turned down for comfort. Pt does doze off within 30 minutes but it's just off and on throughout his treatment. Pt does experience dissociation but he is all clear by time of his discharge. Pt's vital signs assessed at 40 minutes, 9:45 AM, 135/84, 65. Pt resting/sleeping until end of treatment then [name] PA-C comes in to discuss his progress and treatment. Discharge vital signs assessed at 10:55 AM, 130/80, 62. Pt was observed a total 120 minutes per FDA/REMS requirements, his symptoms of dissociation and sleepiness are all resolved prior to his discharge. No medications given during his treatment for any symptoms. Nursing assessed pt a total of 45 minutes.Pt asked to call nurse for next apt.        LOT [id number] EXP JAN 2027

## 2022-07-20 NOTE — Progress Notes (Incomplete)
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 Pt's last treatment was on 02/21/2022. Pt has made some lifestyle changes so he feels he does not need to come to his treatment weekly right now and does travel with his job. Pt also lost quite a bit of weight.  Pt will probably come every 3-4 weeks. Spravato is delivered locally by [facility] and stored at [employer] office in a safe behind another locked door, due to being a schedule CIII medication.    Pt taken to treatment room. No complaints voiced prior to starting treatment. Initial vital signs assessed at 2:10 PM 143/82, 69. Pt asked to blow his nose and recline his chair to 45 degree angle to begin his treatment. Pt given 1 of the 28 mg dose nasal sprays self administered and nurse observed, with 5 minutes in between all 3 doses until completed. Pt brings head phones and an eye mask, the lights are turned down for comfort. Pt does doze off within 30 minutes but it's just off and on throughout his treatment. Pt does experience dissociation but he is all clear by time of his discharge. Pt's vital signs assessed at 40 minutes, 3:00 PM, 134/72, 64. Pt resting/sleeping until end of treatment then [name] PA-C comes in to discuss his progress and treatment. Discharge vital signs assessed at 4:05 PM, 138/80, 66. Pt was observed a total 120 minutes per FDA/REMS requirements, his symptoms of dissociation and sleepiness are all resolved prior to his discharge. No medications given during his treatment for any symptoms. Nursing assessed pt a total of 45 minutes.Pt asked to call nurse for next apt.        LOT [id number] EXP JUL 2024

## 2022-08-02 ENCOUNTER — Other Ambulatory Visit: Payer: Self-pay

## 2022-08-02 MED ORDER — SPRAVATO (84 MG DOSE) 28 MG/DEVICE NA SOPK: 84.0000 mg | PACK | NASAL | 3 refills | Status: DC

## 2022-08-05 ENCOUNTER — Other Ambulatory Visit: Payer: Self-pay

## 2022-08-05 MED ORDER — ESZOPICLONE 1 MG PO TABS: 1.0000 mg | ORAL_TABLET | Freq: Every evening | ORAL | 1 refills | Status: DC | PRN

## 2022-09-23 ENCOUNTER — Other Ambulatory Visit: Payer: Self-pay | Admitting: Physician Assistant

## 2022-10-10 ENCOUNTER — Encounter: Payer: Self-pay | Admitting: Physician Assistant

## 2022-10-10 ENCOUNTER — Ambulatory Visit (INDEPENDENT_AMBULATORY_CARE_PROVIDER_SITE_OTHER): Payer: 59 | Admitting: Physician Assistant

## 2022-10-10 VITALS — BP 153/103 | HR 69

## 2022-10-10 DIAGNOSIS — F902 Attention-deficit hyperactivity disorder, combined type: Secondary | ICD-10-CM | POA: Diagnosis not present

## 2022-10-10 DIAGNOSIS — F329 Major depressive disorder, single episode, unspecified: Secondary | ICD-10-CM | POA: Diagnosis not present

## 2022-10-10 DIAGNOSIS — F429 Obsessive-compulsive disorder, unspecified: Secondary | ICD-10-CM

## 2022-10-10 MED ORDER — AMPHETAMINE-DEXTROAMPHET ER 20 MG PO CP24: 20.0000 mg | ORAL_CAPSULE | Freq: Every day | ORAL | 0 refills | Status: DC

## 2022-10-10 MED ORDER — FLUVOXAMINE MALEATE 100 MG PO TABS: 150.0000 mg | ORAL_TABLET | Freq: Every day | ORAL | 1 refills | Status: DC

## 2022-10-10 NOTE — Progress Notes (Signed)
[facility]  Patient ID: Albert [name],  MRN: [id number]  PCP: Esperanza Richters, PA-C  Date of Evaluation: 10/10/2022 Time spent:20 minutes  Chief Complaint:  Chief Complaint   Anxiety; ADHD; Follow-up    HISTORY/CURRENT STATUS: HPI For routine med check.  Not focusing as well, he'd been taking Adzenys very sparingly, but doesn't have anymore now. Asks about Adderall. Has taken some of his wife's and it's helped.   Not taking the [facility], he felt like he was dependent on it, so he got off.  Sleeping well now.   This winter wasn't as bad as usual, got a couple of Spravato treatments which have helped.  Patient is able to enjoy things.  Energy and motivation are good.  Work is going well.   No extreme sadness, tearfulness, or feelings of hopelessness.  ADLs and personal hygiene are normal.   Appetite has not changed.  Weight is stable.  Not anxious.  No OCD sx, at least not bad ones.  Feels like Luvox has really helped.  Denies suicidal or homicidal thoughts.  Patient denies increased energy with decreased need for sleep, increased talkativeness, racing thoughts, impulsivity or risky behaviors, increased spending, increased libido, grandiosity, increased irritability or anger, paranoia, or hallucinations.  Denies dizziness, syncope, seizures, numbness, tingling, tremor, tics, unsteady gait, slurred speech, confusion. Denies muscle or joint pain, stiffness, or dystonia.  Individual Medical History/ Review of Systems: Changes? :No   Past medications for mental health diagnoses include: Luvox, Lunesta, Xanax, Wellbutrin, Risperdal, Prozac, Ambien, Deplin, Abilify, naltrexone, Adderall, Spravato, Trintellix was helpful for depression but not OCD so it was discontinued. Adzenys  Allergies: Sulfamethoxazole and Sulfonamide derivatives  Current Medications:  Current Outpatient Medications:    amphetamine-dextroamphetamine (ADDERALL XR) 20 MG 24 hr capsule, Take 1 capsule (20 mg  total) by mouth daily., Disp: 30 capsule, Rfl: 0   ezetimibe (ZETIA) 10 MG tablet, Take 1 tablet (10 mg total) by mouth daily., Disp: 90 tablet, Rfl: 3   fluvoxaMINE (LUVOX) 100 MG tablet, Take 1.5 tablets (150 mg total) by mouth at bedtime., Disp: 135 tablet, Rfl: 1   rosuvastatin (CRESTOR) 10 MG tablet, Take 1 tablet (10 mg total) by mouth daily., Disp: 45 tablet, Rfl: 0   telmisartan-hydrochlorothiazide (MICARDIS HCT) 80-12.5 MG tablet, Take 1 tablet by mouth daily., Disp: 30 tablet, Rfl: 11   testosterone cypionate (DEPO-TESTOSTERONE) 100 MG/ML injection, Inject 0.3 mLs (30 mg total) into the muscle once a week. Discard vial 28 days after first use., Disp: 10 mL, Rfl: 0 Medication Side Effects: sexual dysfunction  tolerable   Family Medical/ Social History: Changes? In-laws had been living with him and his wife, but just moved out.   MENTAL HEALTH EXAM:   Blood pressure (!) 153/103, pulse 69.There is no height or weight on file to calculate BMI.  General Appearance: Casual and Well Groomed  Eye Contact:  Good  Speech:  Clear and Coherent and Normal Rate  Volume:  Normal  Mood:  Euthymic  Affect:  Congruent  Thought Process:  Goal Directed and Descriptions of Associations: Circumstantial  Orientation:  Full (Time, Place, and Person)  Thought Content: Logical   Suicidal Thoughts:  No  Homicidal Thoughts:  No  Memory:  WNL  Judgement:  Good  Insight:  Good  Psychomotor Activity:  Normal  Concentration:  Concentration: Good and Attention Span: Good  Recall:  Good  Fund of Knowledge: Good  Language: Good  Assets:  Desire for Improvement Financial Resources/Insurance Housing Transportation Vocational/Educational  ADL's:  Intact  Cognition: WNL  Prognosis:  Good   DIAGNOSES:    ICD-10-CM   1. Obsessive-compulsive disorder, unspecified type  F42.9     2. Treatment-resistant depression  F32.9     3. Attention deficit hyperactivity disorder (ADHD), combined type  F90.2       Receiving Psychotherapy: No   RECOMMENDATIONS:  PDMP was reviewed.   Lunesta filled 08/05/2022. Spravato filled 08/14/2022. I provided 20 minutes of face to face time during this encounter, including time spent before and after the visit in records review, medical decision making, counseling pertinent to today's visit, and charting.   Depression is well-controlled. No changes in treatment. OCD is controlled as well.  Ok to change Adzynes to Adderall, since it's more readily available now.   Start Adderall XR 20 mg q am. Ok to send in Rx before next OV. Continue Luvox 150 mg, nightly for OCD and depression.  Return in 6 months.     [name], PA-C

## 2022-10-14 ENCOUNTER — Telehealth: Payer: Self-pay

## 2022-10-14 NOTE — Telephone Encounter (Signed)
Did you cancel the 20 mg or did you?

## 2022-10-14 NOTE — Telephone Encounter (Signed)
Pt called requesting his Adderall XR change to 15 mg instead of the 20 mg. He did not pick up the 20 mg and told the pharmacy to cancel that one. The 15 mg XR is pended.

## 2022-10-16 ENCOUNTER — Other Ambulatory Visit: Payer: Self-pay | Admitting: Physician Assistant

## 2022-10-16 MED ORDER — AMPHETAMINE-DEXTROAMPHET ER 15 MG PO CP24: 15.0000 mg | ORAL_CAPSULE | ORAL | 0 refills | Status: DC

## 2022-12-05 ENCOUNTER — Other Ambulatory Visit: Payer: Self-pay

## 2022-12-05 ENCOUNTER — Telehealth: Payer: Self-pay | Admitting: Physician Assistant

## 2022-12-05 NOTE — Telephone Encounter (Signed)
Pended.

## 2022-12-05 NOTE — Telephone Encounter (Signed)
Albert [name] called at 2:40 to request refill of his Adderall XR.  He has been on 15mg  but would like to increase to 20mg .  Appt 11/5.  [facility] to [id number] [place],  - [address] AT [address] [facility] [address] . He has NOT confirmed that they have it.

## 2022-12-06 MED ORDER — AMPHETAMINE-DEXTROAMPHETAMINE 20 MG PO TABS: 20.0000 mg | ORAL_TABLET | Freq: Every day | ORAL | 0 refills | Status: DC

## 2022-12-10 ENCOUNTER — Telehealth: Payer: Self-pay | Admitting: Physician Assistant

## 2022-12-10 NOTE — Telephone Encounter (Signed)
Pt called and and said that he needs the adderall xr 20 mg . The one that was sent was the instant release of adderall. The [facility] [address]

## 2022-12-11 ENCOUNTER — Other Ambulatory Visit: Payer: Self-pay

## 2022-12-11 MED ORDER — AMPHETAMINE-DEXTROAMPHET ER 20 MG PO CP24: 20.0000 mg | ORAL_CAPSULE | Freq: Every day | ORAL | 0 refills | Status: DC

## 2022-12-11 NOTE — Telephone Encounter (Signed)
Pended.

## 2022-12-17 ENCOUNTER — Other Ambulatory Visit: Payer: Self-pay | Admitting: Physician Assistant

## 2023-01-13 ENCOUNTER — Telehealth: Payer: Self-pay | Admitting: Physician Assistant

## 2023-01-13 ENCOUNTER — Other Ambulatory Visit: Payer: Self-pay

## 2023-01-13 MED ORDER — AMPHETAMINE-DEXTROAMPHET ER 20 MG PO CP24: 20.0000 mg | ORAL_CAPSULE | Freq: Every day | ORAL | 0 refills | Status: DC

## 2023-01-13 NOTE — Telephone Encounter (Signed)
Next visit is 04/15/23. Requesting refill on Adderall XR 20 mg called to:  [id number] - [place], [place] - [address] AT [address] [facility] [address]   Phone: [phone]  Fax: [phone]    Per patient it is in stock.

## 2023-01-13 NOTE — Telephone Encounter (Signed)
Pended.

## 2023-01-15 ENCOUNTER — Other Ambulatory Visit: Payer: Self-pay

## 2023-01-15 MED ORDER — AMPHETAMINE-DEXTROAMPHET ER 20 MG PO CP24: 20.0000 mg | ORAL_CAPSULE | Freq: Every day | ORAL | 0 refills | Status: DC

## 2023-01-15 NOTE — Telephone Encounter (Signed)
Rx sent to incorrect pharmacy. The other [facility] XR [facility]. Please send in to: [facility] [address]

## 2023-01-15 NOTE — Telephone Encounter (Signed)
Canceled Rx at the [facility] [address] mentioned in first message and repended to [facility] [address].

## 2023-02-17 ENCOUNTER — Other Ambulatory Visit: Payer: Self-pay

## 2023-02-17 ENCOUNTER — Telehealth: Payer: Self-pay | Admitting: Physician Assistant

## 2023-02-17 NOTE — Telephone Encounter (Signed)
Pended.

## 2023-02-17 NOTE — Telephone Encounter (Signed)
Please send in Adderall XR 20mg  RF to:  [facility] [address], [place] Appt- 11/5

## 2023-02-18 MED ORDER — AMPHETAMINE-DEXTROAMPHET ER 20 MG PO CP24: 20.0000 mg | ORAL_CAPSULE | Freq: Every day | ORAL | 0 refills | Status: DC

## 2023-03-25 ENCOUNTER — Telehealth: Payer: Self-pay | Admitting: Physician Assistant

## 2023-03-25 ENCOUNTER — Other Ambulatory Visit: Payer: Self-pay

## 2023-03-25 MED ORDER — AMPHETAMINE-DEXTROAMPHET ER 20 MG PO CP24: 20.0000 mg | ORAL_CAPSULE | Freq: Every day | ORAL | 0 refills | Status: DC

## 2023-03-25 NOTE — Telephone Encounter (Signed)
Pt called asking for a refill on his adderall xr 20 mg. [facility] [place] [facility] [address]

## 2023-03-25 NOTE — Telephone Encounter (Signed)
PENDED

## 2023-04-15 ENCOUNTER — Ambulatory Visit: Payer: 59 | Admitting: Physician Assistant

## 2023-04-16 ENCOUNTER — Encounter: Payer: Self-pay | Admitting: Physician Assistant

## 2023-04-16 ENCOUNTER — Ambulatory Visit (INDEPENDENT_AMBULATORY_CARE_PROVIDER_SITE_OTHER): Payer: 59 | Admitting: Physician Assistant

## 2023-04-16 VITALS — BP 151/101 | HR 68

## 2023-04-16 DIAGNOSIS — F329 Major depressive disorder, single episode, unspecified: Secondary | ICD-10-CM | POA: Diagnosis not present

## 2023-04-16 DIAGNOSIS — F338 Other recurrent depressive disorders: Secondary | ICD-10-CM

## 2023-04-16 DIAGNOSIS — F429 Obsessive-compulsive disorder, unspecified: Secondary | ICD-10-CM

## 2023-04-16 DIAGNOSIS — F902 Attention-deficit hyperactivity disorder, combined type: Secondary | ICD-10-CM | POA: Diagnosis not present

## 2023-04-16 MED ORDER — AMPHETAMINE-DEXTROAMPHET ER 20 MG PO CP24: 20.0000 mg | ORAL_CAPSULE | Freq: Every day | ORAL | 0 refills | Status: DC

## 2023-04-16 MED ORDER — FLUVOXAMINE MALEATE 100 MG PO TABS: 150.0000 mg | ORAL_TABLET | Freq: Every day | ORAL | 1 refills | Status: AC

## 2023-04-16 MED ORDER — PROPRANOLOL HCL 10 MG PO TABS: 10.0000 mg | ORAL_TABLET | Freq: Two times a day (BID) | ORAL | 0 refills | Status: DC | PRN

## 2023-04-16 MED ORDER — GUANFACINE HCL ER 1 MG PO TB24: 1.0000 mg | ORAL_TABLET | Freq: Every evening | ORAL | 1 refills | Status: DC

## 2023-04-16 NOTE — Progress Notes (Signed)
[facility]  Patient ID: Albert [name],  MRN: [id number]  PCP: Albert [name]  Date of Evaluation: 04/16/2023 Time spent:25 minutes  Chief Complaint:  Chief Complaint   Anxiety; Follow-up    HISTORY/CURRENT STATUS: HPI For routine med check.  Doing well. OCD is well-contolled. Albert [name] takes a propranolol for social anxiety and it's helpful. No PA but does sometimes get overwhelmed, mostly with work.   Wonders if there's something other than a stimulant that will help him stay on task. Feels like the Adderall wears off early in the afternoon and he doesn't really want to take another stimulant dose b/c it'll mess up his sleep.   Patient is able to enjoy things.  Energy and motivation are good.  Work is going well.   No extreme sadness, tearfulness, or feelings of hopelessness.  Sleeps well. ADLs and personal hygiene are normal.   Appetite has not changed.  Weight is stable.  Denies suicidal or homicidal thoughts.  Patient denies increased energy with decreased need for sleep, increased talkativeness, racing thoughts, impulsivity or risky behaviors, increased spending, increased libido, grandiosity, increased irritability or anger, paranoia, or hallucinations.  States his BP is nl when he checks it at home. Has white coat HTN.  Denies dizziness, syncope, seizures, numbness, tingling, tremor, tics, unsteady gait, slurred speech, confusion. Denies muscle or joint pain, stiffness, or dystonia.  Individual Medical History/ Review of Systems: Changes? :No   Past medications for mental health diagnoses include: Luvox, Lunesta, Xanax, Wellbutrin, Risperdal, Prozac, Ambien, Deplin, Abilify, naltrexone, Adderall, Spravato, Trintellix was helpful for depression but not OCD so it was discontinued. Adzenys  Allergies: Sulfamethoxazole and Sulfonamide derivatives  Current Medications:  Current Outpatient Medications:    [START ON 05/23/2023] amphetamine-dextroamphetamine  (ADDERALL XR) 20 MG 24 hr capsule, Take 1 capsule (20 mg total) by mouth daily., Disp: 30 capsule, Rfl: 0   [START ON 04/24/2023] amphetamine-dextroamphetamine (ADDERALL XR) 20 MG 24 hr capsule, Take 1 capsule (20 mg total) by mouth daily., Disp: 30 capsule, Rfl: 0   ezetimibe (ZETIA) 10 MG tablet, Take 1 tablet (10 mg total) by mouth daily., Disp: 90 tablet, Rfl: 3   guanFACINE (INTUNIV) 1 MG TB24 ER tablet, Take 1 tablet (1 mg total) by mouth at bedtime., Disp: 30 tablet, Rfl: 1   rosuvastatin (CRESTOR) 10 MG tablet, Take 1 tablet (10 mg total) by mouth daily., Disp: 45 tablet, Rfl: 0   telmisartan-hydrochlorothiazide (MICARDIS HCT) 80-12.5 MG tablet, Take 1 tablet by mouth daily., Disp: 30 tablet, Rfl: 11   testosterone cypionate (DEPO-TESTOSTERONE) 100 MG/ML injection, Inject 0.3 mLs (30 mg total) into the muscle once a week. Discard vial 28 days after first use., Disp: 10 mL, Rfl: 0   [START ON 06/22/2023] amphetamine-dextroamphetamine (ADDERALL XR) 20 MG 24 hr capsule, Take 1 capsule (20 mg total) by mouth daily., Disp: 30 capsule, Rfl: 0   fluvoxaMINE (LUVOX) 100 MG tablet, Take 1.5 tablets (150 mg total) by mouth at bedtime., Disp: 135 tablet, Rfl: 1   propranolol (INDERAL) 10 MG tablet, Take 1 tablet (10 mg total) by mouth 2 (two) times daily as needed., Disp: 60 tablet, Rfl: 0 Medication Side Effects: sexual dysfunction  tolerable   Family Medical/ Social History: Changes? none  MENTAL HEALTH EXAM:   Blood pressure (!) 151/101, pulse 68.There is no height or weight on file to calculate BMI.  General Appearance: Casual and Well Groomed  Eye Contact:  Good  Speech:  Clear and Coherent and Normal Rate  Volume:  Normal  Mood:  Euthymic  Affect:  Congruent  Thought Process:  Goal Directed and Descriptions of Associations: Circumstantial  Orientation:  Full (Time, Place, and Person)  Thought Content: Logical   Suicidal Thoughts:  No  Homicidal Thoughts:  No  Memory:  WNL  Judgement:   Good  Insight:  Good  Psychomotor Activity:  Normal  Concentration:  Concentration: Good and Attention Span: Good  Recall:  Good  Fund of Knowledge: Good  Language: Good  Assets:  Desire for Improvement Financial Resources/Insurance Housing Resilience Transportation Vocational/Educational  ADL's:  Intact  Cognition: WNL  Prognosis:  Good   DIAGNOSES:    ICD-10-CM   1. Attention deficit hyperactivity disorder (ADHD), combined type  F90.2     2. Obsessive-compulsive disorder, unspecified type  F42.9     3. Seasonal affective disorder (HCC)  F33.8     4. Treatment-resistant depression  F32.9      Receiving Psychotherapy: No   RECOMMENDATIONS:  PDMP was reviewed.  Adderall filled 03/25/2023. I provided 25 minutes of face to face time during this encounter, including time spent before and after the visit in records review, medical decision making, counseling pertinent to today's visit, and charting.   We disc the ADHD and other tx options. The Adderall is effective but adding Kapvay or Intuniv would be helpful plus should help BP as well. Benefits, risks, and SE discussed and he accepts.   He tends to get depressed in the winter, has taken Spravato in the past, prn for SAD. It has been effective and he would like the option of that if his sx recur. He needs to contact the office asap if he wants to get back on it, there may be a delay in PA and scheduling, it's not guaranteed that he'll be able to call and get in the next day or even the next week for example. He understands.   Continue Adderall XR 20 mg q am.  Continue Luvox 150 mg, nightly for OCD and depression.  Start Intuniv 1 mg, 1 po at bedtime. Continue Propranolol 20 mg, tid prn. Return in 2 months.     [name], PA-C

## 2023-04-23 ENCOUNTER — Ambulatory Visit: Payer: 59 | Admitting: Medical

## 2023-05-11 ENCOUNTER — Other Ambulatory Visit: Payer: Self-pay | Admitting: Physician Assistant

## 2023-05-12 NOTE — Telephone Encounter (Signed)
Please schedule pt an appt. He is due back in Jan. Lv 11/6

## 2023-05-16 ENCOUNTER — Telehealth: Payer: Self-pay | Admitting: Physician Assistant

## 2023-05-16 NOTE — Telephone Encounter (Signed)
LF Adderall 20 XR 10/15.

## 2023-05-16 NOTE — Telephone Encounter (Signed)
Pt  called and said that he never has gotten the addderall xr 20 mg filled. He is trying to wean off the adderall instant release. So he would like to cancel the xr scripts and send in adderall 20 mg he cuts in [facility] to [facility] [place] on [address]

## 2023-05-23 ENCOUNTER — Encounter: Payer: 59 | Admitting: Medical

## 2023-05-26 ENCOUNTER — Other Ambulatory Visit: Payer: Self-pay | Admitting: Physician Assistant

## 2023-07-14 ENCOUNTER — Other Ambulatory Visit: Payer: Self-pay | Admitting: Medical

## 2023-07-14 ENCOUNTER — Ambulatory Visit: Payer: 59 | Admitting: Medical

## 2023-07-16 ENCOUNTER — Ambulatory Visit: Payer: 59 | Admitting: Medical

## 2023-07-16 VITALS — BP 136/82 | HR 59 | Temp 98.0°F | Resp 16 | Ht 73.0 in | Wt 198.2 lb

## 2023-07-16 DIAGNOSIS — E785 Hyperlipidemia, unspecified: Secondary | ICD-10-CM

## 2023-07-16 DIAGNOSIS — I10 Essential (primary) hypertension: Secondary | ICD-10-CM | POA: Diagnosis not present

## 2023-07-16 DIAGNOSIS — Z1211 Encounter for screening for malignant neoplasm of colon: Secondary | ICD-10-CM

## 2023-07-16 DIAGNOSIS — Z0000 Encounter for general adult medical examination without abnormal findings: Secondary | ICD-10-CM

## 2023-07-16 LAB — CBC WITH DIFFERENTIAL/PLATELET
Basophils Absolute: 0 10*3/uL (ref 0.0–0.1)
Basophils Relative: 0.3 % (ref 0.0–3.0)
Eosinophils Absolute: 0.2 10*3/uL (ref 0.0–0.7)
Eosinophils Relative: 3.7 % (ref 0.0–5.0)
HCT: 48.6 % (ref 39.0–52.0)
Hemoglobin: 16.9 g/dL (ref 13.0–17.0)
Lymphocytes Relative: 34.7 % (ref 12.0–46.0)
Lymphs Abs: 1.9 10*3/uL (ref 0.7–4.0)
MCHC: 34.7 g/dL (ref 30.0–36.0)
MCV: 90.1 fL (ref 78.0–100.0)
Monocytes Absolute: 0.6 10*3/uL (ref 0.1–1.0)
Monocytes Relative: 10.4 % (ref 3.0–12.0)
Neutro Abs: 2.8 10*3/uL (ref 1.4–7.7)
Neutrophils Relative %: 50.9 % (ref 43.0–77.0)
Platelets: 189 10*3/uL (ref 150.0–400.0)
RBC: 5.4 Mil/uL (ref 4.22–5.81)
RDW: 12.6 % (ref 11.5–15.5)
WBC: 5.4 10*3/uL (ref 4.0–10.5)

## 2023-07-16 LAB — LIPID PANEL
Cholesterol: 115 mg/dL (ref 0–200)
HDL: 35.9 mg/dL — ABNORMAL LOW (ref >39.00)
LDL Cholesterol: 60 mg/dL (ref 0–99)
NonHDL: 79.35
Total CHOL/HDL Ratio: 3
Triglycerides: 97 mg/dL (ref 0.0–149.0)
VLDL: 19.4 mg/dL (ref 0.0–40.0)

## 2023-07-16 LAB — COMPREHENSIVE METABOLIC PANEL
ALT: 63 U/L — ABNORMAL HIGH (ref 0–53)
AST: 37 U/L (ref 0–37)
Albumin: 4.3 g/dL (ref 3.5–5.2)
Alkaline Phosphatase: 57 U/L (ref 39–117)
BUN: 16 mg/dL (ref 6–23)
CO2: 28 meq/L (ref 19–32)
Calcium: 8.8 mg/dL (ref 8.4–10.5)
Chloride: 102 meq/L (ref 96–112)
Creatinine, Ser: 0.98 mg/dL (ref 0.40–1.50)
GFR: 92.06 mL/min (ref >60.00)
Glucose, Bld: 81 mg/dL (ref 70–99)
Potassium: 4.2 meq/L (ref 3.5–5.1)
Sodium: 137 meq/L (ref 135–145)
Total Bilirubin: 0.6 mg/dL (ref 0.2–1.2)
Total Protein: 6.9 g/dL (ref 6.0–8.3)

## 2023-07-16 MED ORDER — TELMISARTAN-HCTZ 80-12.5 MG PO TABS: 1.0000 | ORAL_TABLET | Freq: Every day | ORAL | 3 refills | Status: AC

## 2023-07-16 NOTE — Progress Notes (Signed)
 Subjective:    Patient ID: Albert [name], male    DOB: 1976-03-20, 48 y.o.   MRN: [id number]  HPI  Pt works in community education officer.   Wellness exam. Walk a lot golfing twice a week.. Diet has been moderate healthy. He is no longer drinking alcohol.  Non smoker. Married- 3 childrenn   Pt has not had colonoscopy.Place referral today again.  Flu vaccine declined.  Low tesosterone on replacement.  Htn- on bp med. Refilled today.  High cholesterol- refilled zetia .   He has been managing high cholesterol with Zetia  for the past year. A comprehensive cardiac evaluation, including a carotid ultrasound and a calcium  score test, returned normal results. He has experienced muscle aches and mood changes with statins, leading to reluctance in using them. There is a family history of high cholesterol, with both parents affected, his father having undergone stent placement two years ago, and his grandfather dying of heart disease in his eighties.  He has been off his blood pressure medication, telmisartan  80/12.5 mg, for about a week and has not been regularly monitoring his blood pressure at home. He also uses Inderal  as needed for anxiety related to public speaking and takes Adderall, prescribed by a psychiatrist, on an as-needed basis, acknowledging that it may raise his blood pressure.  In early January, he experienced a severe flu-like illness with acute onset of body aches, fever, and chills, which he suspects was the flu. His wife also became ill but tested negative for COVID-19. Recently, he contracted norovirus at a work astronomer, which affected several attendees, but he has since recovered.  Socially, he reports a significant reduction in alcohol consumption, having abstained throughout January except for one drink on the 31st. Previously, he was consuming a few drinks nightly.    Review of Systems  Constitutional:  Negative for chills, fatigue, fever and unexpected weight change.  HENT:   Negative for congestion.   Respiratory:  Negative for cough, chest tightness, shortness of breath and wheezing.   Cardiovascular:  Negative for chest pain and palpitations.  Gastrointestinal:  Negative for abdominal pain, blood in stool, diarrhea and nausea.  Genitourinary:  Negative for dysuria.  Musculoskeletal:  Negative for back pain.  Neurological:  Negative for dizziness, light-headedness and headaches.  Hematological:  Negative for adenopathy. Does not bruise/bleed easily.  Psychiatric/Behavioral:  Negative for behavioral problems, confusion, dysphoric mood, sleep disturbance and suicidal ideas. The patient is not nervous/anxious.        Mood and anxiety controlled.    Past Medical History:  Diagnosis Date   Attention deficit hyperactivity disorder (ADHD) 06/19/2016   Closed nondisplaced fracture of neck of fifth metacarpal bone of right hand 07/10/2016   CONTACT DERMATITIS&OTHER ECZEMA DUE UNSPEC CAUSE 10/06/2009   Qualifier: Diagnosis of  By: Joshua MD, Debby CROME.    Depression    DEPRESSION 02/20/2009   Qualifier: Diagnosis of  By: Joshua MD, Debby CROME.    Hyperlipidemia    NECK PAIN, ACUTE 09/13/2009   Qualifier: Diagnosis of  By: Joshua MD, Debby CROME.    Thrombosed external hemorrhoid 10/06/2015   Symptoms and exam consistent with thrombosed external hemorrhoid. Patient declines incise and drain at this time. Treat conservatively with hydrocortisone  rectal cream and Tylenol  3 as needed for discomfort. Recommend sitz bath. Follow-up if symptoms worsen or do not improve or would like to seek interventional procedure.    TOBACCO USE, QUIT 03/16/2009   Qualifier: Diagnosis of  By: Ronnald Ards  Social History   Socioeconomic History   Marital status: Married    Spouse name: Not on file   Number of children: 1   Years of education: 16   Highest education level: Not on file  Occupational History   Occupation: Sales  Tobacco Use   Smoking status: Never   Smokeless tobacco:  Never   Tobacco comments:    socially smoked years ago.  Vaping Use   Vaping status: Never Used  Substance and Sexual Activity   Alcohol use: Not Currently    Comment: Stopped 07/11/2021   Drug use: No   Sexual activity: Yes  Other Topics Concern   Not on file  Social History Narrative   Fun: Exercise, play music, sports   Social Drivers of Corporate Investment Banker Strain: Not on file  Food Insecurity: Not on file  Transportation Needs: Not on file  Physical Activity: Not on file  Stress: Not on file  Social Connections: Not on file  Intimate Partner Violence: Not on file    Past Surgical History:  Procedure Laterality Date   HERNIA REPAIR      Family History  Problem Relation Age of Onset   Healthy Mother    Hyperlipidemia Paternal Grandfather    Heart disease Paternal Grandfather    Heart failure Paternal Grandfather    Other Neg Hx        low testosterone     Allergies  Allergen Reactions   Sulfamethoxazole     Other reaction(s): Arthralgias (intolerance)   Sulfonamide Derivatives     REACTION: Rash Stiff joints    Current Outpatient Medications on File Prior to Visit  Medication Sig Dispense Refill   amphetamine -dextroamphetamine  (ADDERALL XR) 20 MG 24 hr capsule Take 1 capsule (20 mg total) by mouth daily. 30 capsule 0   amphetamine -dextroamphetamine  (ADDERALL XR) 20 MG 24 hr capsule Take 1 capsule (20 mg total) by mouth daily. 30 capsule 0   amphetamine -dextroamphetamine  (ADDERALL XR) 20 MG 24 hr capsule Take 1 capsule (20 mg total) by mouth daily. 30 capsule 0   ezetimibe  (ZETIA ) 10 MG tablet Take 1 tablet (10 mg total) by mouth daily. 90 tablet 3   fluvoxaMINE  (LUVOX ) 100 MG tablet Take 1.5 tablets (150 mg total) by mouth at bedtime. 135 tablet 1   guanFACINE  (INTUNIV ) 1 MG TB24 ER tablet Take 1 tablet (1 mg total) by mouth at bedtime. 30 tablet 1   propranolol  (INDERAL ) 10 MG tablet TAKE 1 TABLET BY MOUTH TWICE A DAY AS NEEDED 180 tablet 0    rosuvastatin  (CRESTOR ) 10 MG tablet Take 1 tablet (10 mg total) by mouth daily. 45 tablet 0   testosterone  cypionate (DEPO-TESTOSTERONE ) 100 MG/ML injection Inject 0.3 mLs (30 mg total) into the muscle once a week. Discard vial 28 days after first use. 10 mL 0   No current facility-administered medications on file prior to visit.    BP 136/82   Pulse (!) 59   Temp 98 F (36.7 C) (Oral)   Resp 16   Ht 6' 1 (1.854 m)   Wt 198 lb 3.2 oz (89.9 kg)   SpO2 98%   BMI 26.15 kg/m        Objective:   Physical Exam  General Mental Status- Alert. General Appearance- Not in acute distress.   Skin General: Color- Normal Color. Moisture- Normal Moisture.  Neck  No JVD.  Chest and Lung Exam Auscultation: Breath Sounds: RRR  Cardiovascular Auscultation:Rythm- Regular. Murmurs & Other Heart Sounds:Auscultation  of the heart reveals- No Murmurs.  Abdomen Inspection:-Inspeection Normal. Palpation/Percussion:Note:No mass. Palpation and Percussion of the abdomen reveal- Non Tender, Non Distended + BS, no rebound or guarding.   Neurologic Cranial Nerve exam:- CN III-XII intact(No nystagmus), symmetric smile. Strength:- 5/5 equal and symmetric strength both upper and lower extremities.       Assessment & Plan:   For you wellness exam today I have ordered cbc, cmp and lipid panel.  Flu vaccine declined.  Recommend exercise and healthy diet.  We will let you know lab results as they come in.  Follow up date appointment will be determined after lab review.     Htn- bp controlled. Refilled bp med today 90 tab rx with 3 refill.s  High cholesterol- continue zetia  and low cholesterol diet.   He has been managing high cholesterol with Zetia  for the past year. A comprehensive cardiac evaluation, including a carotid ultrasound and a calcium  score test, returned normal results. He has experienced muscle aches and mood changes with statins, leading to reluctance in using them. There  is a family history of high cholesterol, with both parents affected, his father having undergone stent placement two years ago, and his grandfather dying of heart disease in his eighties.  He has been off his blood pressure medication, telmisartan  80/12.5 mg, for about a week and has not been regularly monitoring his blood pressure at home. He also uses Inderal  as needed for anxiety related to public speaking and takes Adderall, prescribed by a psychiatrist, on an as-needed basis, acknowledging that it may raise his blood pressure.  In early January, he experienced a severe flu-like illness with acute onset of body aches, fever, and chills, which he suspects was the flu. His wife also became ill but tested negative for COVID-19. Recently, he contracted norovirus at a work astronomer, which affected several attendees, but he has since recovered.  Socially, he reports a significant reduction in alcohol consumption, having abstained throughout January except for one drink on the 31st. Previously, he was consuming a few drinks nightly.  Anxiety Patient uses Inderal  as needed for social anxiety and public speaking. -Continue current management.  ADHD Patient uses Adderall as needed, prescribed by a psychiatrist. -Continue current management.  Colon Cancer Screening Patient has not had a colonoscopy in over a year. -Advised patient to call the colonoscopy referral directly to schedule the procedure.  Influenza Vaccination Patient has not received the flu vaccine this season and recently recovered from a flu-like illness. -Offered flu vaccine, but patient declined at this time.   00785 charge in addition to wellness exam as did address chronic problems.

## 2023-07-16 NOTE — Addendum Note (Signed)
 Addended by: Serafina Damme on: 07/16/2023 09:36 AM   Modules accepted: Level of Service

## 2023-07-16 NOTE — Patient Instructions (Addendum)
 For you wellness exam today I have ordered cbc, cmp and lipid panel.  Flu vaccine declined.  Recommend exercise and healthy diet.  We will let you know lab results as they come in.  Follow up date appointment will be determined after lab review.     Htn- bp controlled. Refilled bp med today 90 tab rx with 3 refill.s  High cholesterol- continue zetia  and low cholesterol diet.   Preventive Care 39-48 Years Old, Male Preventive care refers to lifestyle choices and visits with your health care provider that can promote health and wellness. Preventive care visits are also called wellness exams. What can I expect for my preventive care visit? Counseling During your preventive care visit, your health care provider may ask about your: Medical history, including: Past medical problems. Family medical history. Current health, including: Emotional well-being. Home life and relationship well-being. Sexual activity. Lifestyle, including: Alcohol, nicotine or tobacco, and drug use. Access to firearms. Diet, exercise, and sleep habits. Safety issues such as seatbelt and bike helmet use. Sunscreen use. Work and work astronomer. Physical exam Your health care provider will check your: Height and weight. These may be used to calculate your BMI (body mass index). BMI is a measurement that tells if you are at a healthy weight. Waist circumference. This measures the distance around your waistline. This measurement also tells if you are at a healthy weight and may help predict your risk of certain diseases, such as type 2 diabetes and high blood pressure. Heart rate and blood pressure. Body temperature. Skin for abnormal spots. What immunizations do I need?  Vaccines are usually given at various ages, according to a schedule. Your health care provider will recommend vaccines for you based on your age, medical history, and lifestyle or other factors, such as travel or where you work. What  tests do I need? Screening Your health care provider may recommend screening tests for certain conditions. This may include: Lipid and cholesterol levels. Diabetes screening. This is done by checking your blood sugar (glucose) after you have not eaten for a while (fasting). Hepatitis B test. Hepatitis C test. HIV (human immunodeficiency virus) test. STI (sexually transmitted infection) testing, if you are at risk. Lung cancer screening. Prostate cancer screening. Colorectal cancer screening. Talk with your health care provider about your test results, treatment options, and if necessary, the need for more tests. Follow these instructions at home: Eating and drinking  Eat a diet that includes fresh fruits and vegetables, whole grains, lean protein, and low-fat dairy products. Take vitamin and mineral supplements as recommended by your health care provider. Do not drink alcohol if your health care provider tells you not to drink. If you drink alcohol: Limit how much you have to 0-2 drinks a day. Know how much alcohol is in your drink. In the [place] one drink equals one 12 oz bottle of beer (355 mL), one 5 oz glass of wine (148 mL), or one 1 oz glass of hard liquor (44 mL). Lifestyle Brush your teeth every morning and night with fluoride toothpaste. Floss one time each day. Exercise for at least 30 minutes 5 or more days each week. Do not use any products that contain nicotine or tobacco. These products include cigarettes, chewing tobacco, and vaping devices, such as e-cigarettes. If you need help quitting, ask your health care provider. Do not use drugs. If you are sexually active, practice safe sex. Use a condom or other form of protection to prevent STIs. Take aspirin only as  told by your health care provider. Make sure that you understand how much to take and what form to take. Work with your health care provider to find out whether it is safe and beneficial for you to take aspirin  daily. Find healthy ways to manage stress, such as: Meditation, yoga, or listening to music. Journaling. Talking to a trusted person. Spending time with friends and family. Minimize exposure to UV radiation to reduce your risk of skin cancer. Safety Always wear your seat belt while driving or riding in a vehicle. Do not drive: If you have been drinking alcohol. Do not ride with someone who has been drinking. When you are tired or distracted. While texting. If you have been using any mind-altering substances or drugs. Wear a helmet and other protective equipment during sports activities. If you have firearms in your house, make sure you follow all gun safety procedures. What's next? Go to your health care provider once a year for an annual wellness visit. Ask your health care provider how often you should have your eyes and teeth checked. Stay up to date on all vaccines. This information is not intended to replace advice given to you by your health care provider. Make sure you discuss any questions you have with your health care provider. Document Revised: 11/22/2020 Document Reviewed: 11/22/2020 Elsevier Patient Education  2024 [employer].

## 2023-07-19 ENCOUNTER — Encounter: Payer: Self-pay | Admitting: Medical

## 2023-07-22 ENCOUNTER — Encounter: Payer: Self-pay | Admitting: Physician Assistant

## 2023-07-22 ENCOUNTER — Ambulatory Visit: Payer: 59 | Admitting: Physician Assistant

## 2023-07-22 DIAGNOSIS — F429 Obsessive-compulsive disorder, unspecified: Secondary | ICD-10-CM | POA: Diagnosis not present

## 2023-07-22 DIAGNOSIS — F902 Attention-deficit hyperactivity disorder, combined type: Secondary | ICD-10-CM

## 2023-07-22 DIAGNOSIS — F338 Other recurrent depressive disorders: Secondary | ICD-10-CM

## 2023-07-22 MED ORDER — AMPHETAMINE-DEXTROAMPHETAMINE 20 MG PO TABS: 10.0000 mg | ORAL_TABLET | Freq: Every day | ORAL | 0 refills | Status: AC

## 2023-07-22 NOTE — Progress Notes (Signed)
[facility]  Patient ID: Albert [name],  MRN: [id number]  PCP: Esperanza Richters, PA-C  Date of Evaluation: 07/22/2023 Time spent:20 minutes  Chief Complaint:  Chief Complaint   Anxiety; Depression; Follow-up    HISTORY/CURRENT STATUS: HPI For routine med check.  Feels like the Adderall is helpful but b/c he's on extended release it can be a problem going back on it after a few days off, he does not want to take it every day if he does not have to.  Without it though especially during the week he has trouble staying on task.  He never took the Intuniv, states the pharmacy said there was an interaction and did not fill it.  He has more depression this time of year but it has not been too bad this year.  He has not had a Spravato treatment in a long time now.  It was helpful when needed.  He is able to enjoy things, he went golfing several times over this past week and enjoyed that.  Energy and motivation are good.  Work is going well.   No extreme sadness, tearfulness, or feelings of hopelessness.  Sleeps well most of the time. ADLs and personal hygiene are normal.  Appetite has not changed.  Weight is stable.  Denies suicidal or homicidal thoughts.  OCD is mostly controlled.  There are times where he over thinks things and gets in a cycle where he cannot get out of his head but for the most part the Luvox has helped.  The obsessions do not control him as they have in the past.  Generalized anxiety is controlled.  Patient denies increased energy with decreased need for sleep, increased talkativeness, racing thoughts, impulsivity or risky behaviors, increased spending, increased libido, grandiosity, increased irritability or anger, paranoia, or hallucinations.  Denies dizziness, syncope, seizures, numbness, tingling, tremor, tics, unsteady gait, slurred speech, confusion. Denies muscle or joint pain, stiffness, or dystonia.  Individual Medical History/ Review of Systems: Changes?  :No   Past medications for mental health diagnoses include: Luvox, Lunesta, Xanax, Wellbutrin, Risperdal, Prozac, Ambien, Deplin, Abilify, naltrexone, Adderall, Spravato, Trintellix was helpful for depression but not OCD so it was discontinued. Adzenys  Allergies: Sulfamethoxazole and Sulfonamide derivatives  Current Medications:  Current Outpatient Medications:    amphetamine-dextroamphetamine (ADDERALL) 20 MG tablet, Take 0.5-1 tablets (10-20 mg total) by mouth daily., Disp: 30 tablet, Rfl: 0   ezetimibe (ZETIA) 10 MG tablet, Take 1 tablet (10 mg total) by mouth daily., Disp: 90 tablet, Rfl: 3   fluvoxaMINE (LUVOX) 100 MG tablet, Take 1.5 tablets (150 mg total) by mouth at bedtime., Disp: 135 tablet, Rfl: 1   telmisartan-hydrochlorothiazide (MICARDIS HCT) 80-12.5 MG tablet, Take 1 tablet by mouth daily., Disp: 90 tablet, Rfl: 3   testosterone cypionate (DEPO-TESTOSTERONE) 100 MG/ML injection, Inject 0.3 mLs (30 mg total) into the muscle once a week. Discard vial 28 days after first use., Disp: 10 mL, Rfl: 0   propranolol (INDERAL) 10 MG tablet, TAKE 1 TABLET BY MOUTH TWICE A DAY AS NEEDED, Disp: 180 tablet, Rfl: 0   rosuvastatin (CRESTOR) 10 MG tablet, Take 1 tablet (10 mg total) by mouth daily., Disp: 45 tablet, Rfl: 0 Medication Side Effects: sexual dysfunction  tolerable   Family Medical/ Social History: Changes? none  MENTAL HEALTH EXAM:   There were no vitals taken for this visit.There is no height or weight on file to calculate BMI.  General Appearance: Casual and Well Groomed  Eye Contact:  Good  Speech:  Clear and Coherent and Normal Rate  Volume:  Normal  Mood:  Euthymic  Affect:  Congruent  Thought Process:  Goal Directed and Descriptions of Associations: Circumstantial  Orientation:  Full (Time, Place, and Person)  Thought Content: Logical   Suicidal Thoughts:  No  Homicidal Thoughts:  No  Memory:  WNL  Judgement:  Good  Insight:  Good  Psychomotor Activity:  Normal   Concentration:  Concentration: Good and Attention Span: Good  Recall:  Good  Fund of Knowledge: Good  Language: Good  Assets:  Communication Skills Desire for Improvement Financial Resources/Insurance Housing Resilience Transportation Vocational/Educational  ADL's:  Intact  Cognition: WNL  Prognosis:  Good   DIAGNOSES:    ICD-10-CM   1. Attention deficit hyperactivity disorder (ADHD), combined type  F90.2     2. Obsessive-compulsive disorder, unspecified type  F42.9     3. Seasonal affective disorder (HCC)  F33.8      Receiving Psychotherapy: No   RECOMMENDATIONS:  PDMP was reviewed.  Adderall filled 03/25/2023. I provided 20 minutes of face to face time during this encounter, including time spent before and after the visit in records review, medical decision making, counseling pertinent to today's visit, and charting.   We will change Adderall to IR and he can have more control of the dose and how he takes it.  It is fine to take drug holidays.  He will call in approximately 1 to 2 months to let me know how he is responding and I can send in more prescriptions for the Adderall.  Change Adderall to IR, 20 mg, 1/2-1 every morning. Continue Luvox 150 mg, nightly for OCD and depression.  Continue Propranolol 20 mg, tid prn. Return in 6 months.     [name], PA-C

## 2023-08-05 ENCOUNTER — Ambulatory Visit (AMBULATORY_SURGERY_CENTER): Payer: 59 | Admitting: *Deleted

## 2023-08-05 VITALS — Ht 72.0 in | Wt 195.0 lb

## 2023-08-05 DIAGNOSIS — Z1211 Encounter for screening for malignant neoplasm of colon: Secondary | ICD-10-CM

## 2023-08-05 MED ORDER — SUFLAVE 178.7 G PO SOLR: 1.0000 | Freq: Once | ORAL | 0 refills | Status: AC

## 2023-08-05 NOTE — Progress Notes (Signed)
 Pt's name and DOB verified at the beginning of the pre-visit wit 2 identifiers  Pt denies any difficulty with ambulating,sitting, laying down or rolling side to side  Pt has no issues with ambulation   Pt has no issues moving head neck or swallowing  No egg or soy allergy known to patient   No issues known to pt with past sedation with any surgeries or procedures  Pt denies having issues being intubated  Patient denies ever being intubated  No FH of Malignant Hyperthermia  Pt is not on diet pills or shots  Pt is not on home 02   Pt is not on blood thinners   Pt denies issues with constipation   Pt is not on dialysis  Pt denise any abnormal heart rhythms   Pt denies any upcoming cardiac testing    Chart not reviewed by CRNA prior to PV  Visit by phone   Pt states weight is 195 lb    IInstructions reviewed. Pt given [facility] main # and MD on call # prior to instructions.  Pt states understanding of instructions. Instructed to review again prior to procedure. Pt states they will.   Informed pt that they will receive a text or  call from [facility] regarding there prep med.

## 2023-08-20 ENCOUNTER — Encounter: Payer: 59 | Admitting: Gastroenterology

## 2023-08-21 ENCOUNTER — Encounter: Payer: Self-pay | Admitting: Gastroenterology

## 2023-08-28 ENCOUNTER — Ambulatory Visit: Payer: 59 | Admitting: Gastroenterology

## 2023-08-28 ENCOUNTER — Encounter: Payer: Self-pay | Admitting: Gastroenterology

## 2023-08-28 VITALS — BP 135/93 | HR 83 | Temp 97.9°F | Resp 13 | Wt 195.0 lb

## 2023-08-28 DIAGNOSIS — D175 Benign lipomatous neoplasm of intra-abdominal organs: Secondary | ICD-10-CM | POA: Diagnosis not present

## 2023-08-28 DIAGNOSIS — K5730 Diverticulosis of large intestine without perforation or abscess without bleeding: Secondary | ICD-10-CM

## 2023-08-28 DIAGNOSIS — K635 Polyp of colon: Secondary | ICD-10-CM

## 2023-08-28 DIAGNOSIS — Z1211 Encounter for screening for malignant neoplasm of colon: Secondary | ICD-10-CM

## 2023-08-28 DIAGNOSIS — K640 First degree hemorrhoids: Secondary | ICD-10-CM

## 2023-08-28 DIAGNOSIS — K648 Other hemorrhoids: Secondary | ICD-10-CM | POA: Diagnosis not present

## 2023-08-28 DIAGNOSIS — D121 Benign neoplasm of appendix: Secondary | ICD-10-CM

## 2023-08-28 DIAGNOSIS — K389 Disease of appendix, unspecified: Secondary | ICD-10-CM | POA: Diagnosis not present

## 2023-08-28 DIAGNOSIS — D125 Benign neoplasm of sigmoid colon: Secondary | ICD-10-CM

## 2023-08-28 MED ORDER — SODIUM CHLORIDE 0.9 % IV SOLN: 500.0000 mL | INTRAVENOUS | Status: DC

## 2023-08-28 NOTE — Progress Notes (Signed)
 Sedate, gd SR, tolerated procedure well, VSS, report to RN

## 2023-08-28 NOTE — Progress Notes (Signed)
 GASTROENTEROLOGY PROCEDURE H&P NOTE   Primary Care Physician: Esperanza Richters, PA-C    Reason for Procedure:  Colon Cancer screening  Plan:    Colonoscopy  Patient is appropriate for endoscopic procedure(s) in the ambulatory ([facility]) setting.  The nature of the procedure, as well as the risks, benefits, and alternatives were carefully and thoroughly reviewed with the patient. Ample time for discussion and questions allowed. The patient understood, was satisfied, and agreed to proceed.     HPI: Albert [name] is a 48 y.o. male who presents for colonoscopy for routine Colon Cancer screening.  No active GI symptoms.  No known family history of colon cancer or related malignancy.  Patient is otherwise without complaints or active issues today.  Past Medical History:  Diagnosis Date   Arthritis    back   Attention deficit hyperactivity disorder (ADHD) 06/19/2016   Closed nondisplaced fracture of neck of fifth metacarpal bone of right hand 07/10/2016   Depression    DEPRESSION 02/20/2009   Qualifier: Diagnosis of  By: Yetta Barre MD, Bernadene Bell.    Hyperlipidemia    Hypertension    NECK PAIN, ACUTE 09/13/2009   Qualifier: Diagnosis of  By: Yetta Barre MD, Bernadene Bell.    Thrombosed external hemorrhoid 10/06/2015   Symptoms and exam consistent with thrombosed external hemorrhoid. Patient declines incise and drain at this time. Treat conservatively with hydrocortisone rectal cream and Tylenol 3 as needed for discomfort. Recommend sitz bath. Follow-up if symptoms worsen or do not improve or would like to seek interventional procedure.    TOBACCO USE, QUIT 03/16/2009   Qualifier: Diagnosis of  By: Tora Perches      Past Surgical History:  Procedure Laterality Date   HERNIA REPAIR     SHOULDER SURGERY     AC joint    Prior to Admission medications   Medication Sig Start Date End Date Taking? Authorizing Provider  amphetamine-dextroamphetamine (ADDERALL) 20 MG tablet Take 0.5-1 tablets (10-20 mg  total) by mouth daily. 07/22/23  Yes [name] T, PA-C  ezetimibe (ZETIA) 10 MG tablet Take 1 tablet (10 mg total) by mouth daily. 07/10/22  Yes Saguier, Ramon Dredge, PA-C  fluvoxaMINE (LUVOX) 100 MG tablet Take 1.5 tablets (150 mg total) by mouth at bedtime. 04/16/23  Yes Hurst, Glade Nurse, PA-C  telmisartan-hydrochlorothiazide (MICARDIS HCT) 80-12.5 MG tablet Take 1 tablet by mouth daily. 07/16/23  Yes Saguier, Ramon Dredge, PA-C  testosterone cypionate (DEPO-TESTOSTERONE) 100 MG/ML injection Inject 0.3 mLs (30 mg total) into the muscle once a week. Discard vial 28 days after first use. 03/19/21  Yes Romero Belling, MD  propranolol (INDERAL) 10 MG tablet TAKE 1 TABLET BY MOUTH TWICE A DAY AS NEEDED Patient not taking: Reported on 08/28/2023 05/26/23   [name] T, PA-C  rosuvastatin (CRESTOR) 10 MG tablet Take 1 tablet (10 mg total) by mouth daily. Patient not taking: Reported on 08/28/2023 07/08/22   Saguier, Ramon Dredge, PA-C    Current Outpatient Medications  Medication Sig Dispense Refill   amphetamine-dextroamphetamine (ADDERALL) 20 MG tablet Take 0.5-1 tablets (10-20 mg total) by mouth daily. 30 tablet 0   ezetimibe (ZETIA) 10 MG tablet Take 1 tablet (10 mg total) by mouth daily. 90 tablet 3   fluvoxaMINE (LUVOX) 100 MG tablet Take 1.5 tablets (150 mg total) by mouth at bedtime. 135 tablet 1   telmisartan-hydrochlorothiazide (MICARDIS HCT) 80-12.5 MG tablet Take 1 tablet by mouth daily. 90 tablet 3   testosterone cypionate (DEPO-TESTOSTERONE) 100 MG/ML injection Inject 0.3 mLs (30 mg  total) into the muscle once a week. Discard vial 28 days after first use. 10 mL 0   propranolol (INDERAL) 10 MG tablet TAKE 1 TABLET BY MOUTH TWICE A DAY AS NEEDED (Patient not taking: Reported on 08/28/2023) 180 tablet 0   rosuvastatin (CRESTOR) 10 MG tablet Take 1 tablet (10 mg total) by mouth daily. (Patient not taking: Reported on 08/28/2023) 45 tablet 0   Current Facility-Administered Medications  Medication Dose Route  Frequency Provider Last Rate Last Admin   0.9 %  sodium chloride infusion  500 mL Intravenous Continuous Paraskevi Funez V, DO        Allergies as of 08/28/2023 - Review Complete 08/28/2023  Allergen Reaction Noted   Sulfonamide derivatives Rash    Sulfamethoxazole Other (See Comments) 06/19/2016    Family History  Problem Relation Age of Onset   Healthy Mother    Hyperlipidemia Paternal Grandfather    Heart disease Paternal Grandfather    Heart failure Paternal Grandfather    Other Neg Hx        low testosterone   Colon polyps Neg Hx    Colon cancer Neg Hx    Esophageal cancer Neg Hx    Stomach cancer Neg Hx    Rectal cancer Neg Hx     Social History   Socioeconomic History   Marital status: Married    Spouse name: Not on file   Number of children: 1   Years of education: 16   Highest education level: Not on file  Occupational History   Occupation: Sales  Tobacco Use   Smoking status: Never   Smokeless tobacco: Never   Tobacco comments:    socially smoked years ago.  Vaping Use   Vaping status: Never Used  Substance and Sexual Activity   Alcohol use: Yes    Comment: occ   Drug use: No   Sexual activity: Yes  Other Topics Concern   Not on file  Social History Narrative   Fun: Exercise, play music, sports   Social Drivers of Corporate investment banker Strain: Not on file  Food Insecurity: Not on file  Transportation Needs: Not on file  Physical Activity: Not on file  Stress: Not on file  Social Connections: Not on file  Intimate Partner Violence: Not on file    Physical Exam: Vital signs in last 24 hours: @BP  (!) 158/85   Pulse 70   Temp 97.9 F (36.6 C) (Temporal)   Wt 195 lb (88.5 kg)   SpO2 98%   BMI 26.45 kg/m  GEN: NAD EYE: Sclerae anicteric ENT: MMM CV: Non-tachycardic Pulm: CTA b/l GI: Soft, NT/ND NEURO:  Alert & Oriented x 3   Doristine Locks, DO [facility]   08/28/2023 2:15 PM

## 2023-08-28 NOTE — Progress Notes (Signed)
 Pt's states no medical or surgical changes since previsit or office visit.

## 2023-08-28 NOTE — Op Note (Signed)
 [facility] Patient Name: Albert [name] Procedure Date: 08/28/2023 2:12 PM MRN: [id number] Endoscopist: Doristine Locks , MD, [id number] Age: 48 Referring MD:  Date of Birth: [birth year] Gender: Male Account #: [id number] Procedure:                Colonoscopy Indications:              Screening for colorectal malignant neoplasm, This                            is the patient's first colonoscopy Medicines:                Monitored Anesthesia Care Procedure:                Pre-Anesthesia Assessment:                           - Prior to the procedure, a History and Physical                            was performed, and patient medications and                            allergies were reviewed. The patient's tolerance of                            previous anesthesia was also reviewed. The risks                            and benefits of the procedure and the sedation                            options and risks were discussed with the patient.                            All questions were answered, and informed consent                            was obtained. Prior Anticoagulants: The patient has                            taken no anticoagulant or antiplatelet agents. ASA                            Grade Assessment: II - A patient with mild systemic                            disease. After reviewing the risks and benefits,                            the patient was deemed in satisfactory condition to                            undergo the procedure.  After obtaining informed consent, the colonoscope                            was passed under direct vision. Throughout the                            procedure, the patient's blood pressure, pulse, and                            oxygen saturations were monitored continuously. The                            CF [id number] #[id number] was introduced through the anus                            and advanced to the the  terminal ileum. The                            colonoscopy was performed without difficulty. The                            patient tolerated the procedure well. The quality                            of the bowel preparation was good. The terminal                            ileum, ileocecal valve, appendiceal orifice, and                            rectum were photographed. Scope In: 2:23:23 PM Scope Out: 2:46:31 PM Scope Withdrawal Time: 0 hours 19 minutes 24 seconds  Total Procedure Duration: 0 hours 23 minutes 8 seconds  Findings:                 The perianal and digital rectal examinations were                            normal.                           A 5 mm polyp was found in the appendiceal orifice.                            The polyp was sessile. The proximal side was                            visible. The polyp was removed using a combination                            of cold snare, then forceps of all edges. Resection                            and retrieval were complete. Estimated blood loss  was minimal.                           Multiple large-mouthed and small-mouthed                            diverticula were found in the sigmoid colon,                            descending colon and ascending colon.                           A 3 mm polyp was found in the distal sigmoid colon.                            The polyp was sessile. The polyp was removed with a                            cold snare. Resection and retrieval were complete.                            Estimated blood loss was minimal.                           Non-bleeding internal hemorrhoids were found during                            retroflexion. The hemorrhoids were small.                           The terminal ileum appeared normal. Complications:            No immediate complications. Estimated Blood Loss:     Estimated blood loss was minimal. Impression:               - One  5 mm polyp at the appendiceal orifice,                            removed with a cold biopsy forceps and removed with                            a cold snare. Resected and retrieved.                           - Diverticulosis in the sigmoid colon, in the                            descending colon and in the ascending colon.                           - One 3 mm polyp in the distal sigmoid colon,                            removed with a cold snare. Resected and retrieved.                           -  Non-bleeding internal hemorrhoids.                           - The examined portion of the ileum was normal. Recommendation:           - Patient has a contact number available for                            emergencies. The signs and symptoms of potential                            delayed complications were discussed with the                            patient. Return to normal activities tomorrow.                            Written discharge instructions were provided to the                            patient.                           - Resume previous diet.                           - Continue present medications.                           - Await pathology results.                           - Given the location and resection type needed for                            the appendiceal orifice polyp, recommend repeat                            colonoscopy in 3 years for surveillance.                           - Return to GI office PRN. Doristine Locks, MD 08/28/2023 2:53:28 PM

## 2023-08-28 NOTE — Patient Instructions (Signed)
 Handouts provided on diverticulosis, polyps and hemorrhoids.  Resume previous diet.  Continue present medications.  Await pathology results.  Given the location and resection type needed for the appendiceal orifice polyp, recommend repeat colonoscopy in 3 years for surveillance.  Return to GI office as needed.   YOU HAD AN ENDOSCOPIC PROCEDURE TODAY AT THE [facility]:   Refer to the procedure report that was given to you for any specific questions about what was found during the examination.  If the procedure report does not answer your questions, please call your gastroenterologist to clarify.  If you requested that your care partner not be given the details of your procedure findings, then the procedure report has been included in a sealed envelope for you to review at your convenience later.  YOU SHOULD EXPECT: Some feelings of bloating in the abdomen. Passage of more gas than usual.  Walking can help get rid of the air that was put into your GI tract during the procedure and reduce the bloating. If you had a lower endoscopy (such as a colonoscopy or flexible sigmoidoscopy) you may notice spotting of blood in your stool or on the toilet paper. If you underwent a bowel prep for your procedure, you may not have a normal bowel movement for a few days.  Please Note:  You might notice some irritation and congestion in your nose or some drainage.  This is from the oxygen used during your procedure.  There is no need for concern and it should clear up in a day or so.  SYMPTOMS TO REPORT IMMEDIATELY:  Following lower endoscopy (colonoscopy or flexible sigmoidoscopy):  Excessive amounts of blood in the stool  Significant tenderness or worsening of abdominal pains  Swelling of the abdomen that is new, acute  Fever of 100F or higher  For urgent or emergent issues, a gastroenterologist can be reached at any hour by calling [phone]. Do not use MyChart messaging for urgent concerns.     DIET:  We do recommend a small meal at first, but then you may proceed to your regular diet.  Drink plenty of fluids but you should avoid alcoholic beverages for 24 hours.  ACTIVITY:  You should plan to take it easy for the rest of today and you should NOT DRIVE or use heavy machinery until tomorrow (because of the sedation medicines used during the test).    FOLLOW UP: Our staff will call the number listed on your records the next business day following your procedure.  We will call around 7:15- 8:00 am to check on you and address any questions or concerns that you may have regarding the information given to you following your procedure. If we do not reach you, we will leave a message.     If any biopsies were taken you will be contacted by phone or by letter within the next 1-3 weeks.  Please call us at [phone] if you have not heard about the biopsies in 3 weeks.    SIGNATURES/CONFIDENTIALITY: You and/or your care partner have signed paperwork which will be entered into your electronic medical record.  These signatures attest to the fact that that the information above on your After Visit Summary has been reviewed and is understood.  Full responsibility of the confidentiality of this discharge information lies with you and/or your care-partner.

## 2023-08-28 NOTE — Progress Notes (Signed)
 Called to room to assist during endoscopic procedure.  Patient ID and intended procedure confirmed with present staff. Received instructions for my participation in the procedure from the performing physician.

## 2023-08-29 ENCOUNTER — Telehealth: Payer: Self-pay

## 2023-08-29 NOTE — Telephone Encounter (Signed)
  Follow up Call-     08/28/2023    1:37 PM  Call back number  Post procedure Call Back phone  # [phone]  Permission to leave phone message Yes     Patient questions:  Do you have a fever, pain , or abdominal swelling? No. Pain Score  0 *  Have you tolerated food without any problems? Yes.    Have you been able to return to your normal activities? Yes.    Do you have any questions about your discharge instructions: Diet   No. Medications  No. Follow up visit  No.  Do you have questions or concerns about your Care? No.  Actions: * If pain score is 4 or above: No action needed, pain <4.

## 2023-09-02 LAB — SURGICAL PATHOLOGY

## 2023-09-07 ENCOUNTER — Other Ambulatory Visit: Payer: Self-pay | Admitting: Medical

## 2023-09-11 ENCOUNTER — Encounter: Payer: Self-pay | Admitting: Gastroenterology

## 2024-01-16 ENCOUNTER — Other Ambulatory Visit: Payer: Self-pay | Admitting: Physician Assistant

## 2024-01-20 ENCOUNTER — Ambulatory Visit: Payer: 59 | Admitting: Physician Assistant
